# Patient Record
Sex: Female | Born: 1970 | Race: Black or African American | Hispanic: No | Smoking: Current every day smoker
Health system: Southern US, Community
[De-identification: ages and names within clinical notes are randomized; demographics above are authoritative.]

## PROBLEM LIST (undated history)

## (undated) DIAGNOSIS — F1010 Alcohol abuse, uncomplicated: Secondary | ICD-10-CM

## (undated) DIAGNOSIS — S92909A Unspecified fracture of unspecified foot, initial encounter for closed fracture: Secondary | ICD-10-CM

## (undated) DIAGNOSIS — F32A Depression, unspecified: Secondary | ICD-10-CM

## (undated) DIAGNOSIS — F419 Anxiety disorder, unspecified: Secondary | ICD-10-CM

## (undated) DIAGNOSIS — E785 Hyperlipidemia, unspecified: Secondary | ICD-10-CM

## (undated) DIAGNOSIS — I119 Hypertensive heart disease without heart failure: Secondary | ICD-10-CM

## (undated) DIAGNOSIS — F329 Major depressive disorder, single episode, unspecified: Secondary | ICD-10-CM

## (undated) DIAGNOSIS — I43 Cardiomyopathy in diseases classified elsewhere: Principal | ICD-10-CM

## (undated) HISTORY — DX: Unspecified fracture of unspecified foot, initial encounter for closed fracture: S92.909A

## (undated) HISTORY — PX: GASTRIC BYPASS: SHX52

## (undated) HISTORY — DX: Hyperlipidemia, unspecified: E78.5

## (undated) HISTORY — DX: Major depressive disorder, single episode, unspecified: F32.9

## (undated) HISTORY — DX: Depression, unspecified: F32.A

## (undated) HISTORY — DX: Hypertensive heart disease without heart failure: I11.9

## (undated) HISTORY — DX: Cardiomyopathy in diseases classified elsewhere: I43

---

## 1998-05-23 ENCOUNTER — Emergency Department (HOSPITAL_COMMUNITY): Admission: EM | Admit: 1998-05-23 | Discharge: 1998-05-23 | Payer: Self-pay | Admitting: Family Medicine

## 1998-06-01 ENCOUNTER — Emergency Department (HOSPITAL_COMMUNITY): Admission: EM | Admit: 1998-06-01 | Discharge: 1998-06-01 | Payer: Self-pay | Admitting: Emergency Medicine

## 1998-06-17 ENCOUNTER — Emergency Department (HOSPITAL_COMMUNITY): Admission: EM | Admit: 1998-06-17 | Discharge: 1998-06-17 | Payer: Self-pay | Admitting: Emergency Medicine

## 1998-06-17 ENCOUNTER — Encounter: Payer: Self-pay | Admitting: Emergency Medicine

## 1999-06-29 ENCOUNTER — Emergency Department (HOSPITAL_COMMUNITY): Admission: EM | Admit: 1999-06-29 | Discharge: 1999-06-29 | Payer: Self-pay | Admitting: Emergency Medicine

## 2000-05-13 ENCOUNTER — Encounter (INDEPENDENT_AMBULATORY_CARE_PROVIDER_SITE_OTHER): Payer: Self-pay

## 2000-05-13 ENCOUNTER — Other Ambulatory Visit: Admission: RE | Admit: 2000-05-13 | Discharge: 2000-05-13 | Payer: Self-pay | Admitting: Gynecology

## 2000-09-05 ENCOUNTER — Emergency Department (HOSPITAL_COMMUNITY): Admission: EM | Admit: 2000-09-05 | Discharge: 2000-09-06 | Payer: Self-pay | Admitting: Emergency Medicine

## 2000-09-06 ENCOUNTER — Encounter: Payer: Self-pay | Admitting: Emergency Medicine

## 2001-03-16 ENCOUNTER — Other Ambulatory Visit: Admission: RE | Admit: 2001-03-16 | Discharge: 2001-03-16 | Payer: Self-pay | Admitting: Obstetrics and Gynecology

## 2001-05-20 ENCOUNTER — Encounter: Payer: Self-pay | Admitting: Emergency Medicine

## 2001-05-20 ENCOUNTER — Emergency Department (HOSPITAL_COMMUNITY): Admission: EM | Admit: 2001-05-20 | Discharge: 2001-05-20 | Payer: Self-pay | Admitting: Emergency Medicine

## 2002-11-03 ENCOUNTER — Other Ambulatory Visit: Admission: RE | Admit: 2002-11-03 | Discharge: 2002-11-03 | Payer: Self-pay | Admitting: Obstetrics and Gynecology

## 2005-07-05 ENCOUNTER — Emergency Department (HOSPITAL_COMMUNITY): Admission: EM | Admit: 2005-07-05 | Discharge: 2005-07-06 | Payer: Self-pay | Admitting: Emergency Medicine

## 2006-09-16 ENCOUNTER — Emergency Department (HOSPITAL_COMMUNITY): Admission: EM | Admit: 2006-09-16 | Discharge: 2006-09-16 | Payer: Self-pay | Admitting: Emergency Medicine

## 2007-11-08 ENCOUNTER — Other Ambulatory Visit: Admission: RE | Admit: 2007-11-08 | Discharge: 2007-11-08 | Payer: Self-pay | Admitting: Gynecology

## 2007-11-25 ENCOUNTER — Ambulatory Visit (HOSPITAL_COMMUNITY): Admission: RE | Admit: 2007-11-25 | Discharge: 2007-11-25 | Payer: Self-pay | Admitting: Gynecology

## 2008-06-03 ENCOUNTER — Inpatient Hospital Stay (HOSPITAL_COMMUNITY): Admission: AD | Admit: 2008-06-03 | Discharge: 2008-06-04 | Payer: Self-pay | Admitting: Obstetrics

## 2008-08-08 ENCOUNTER — Inpatient Hospital Stay (HOSPITAL_COMMUNITY): Admission: AD | Admit: 2008-08-08 | Discharge: 2008-08-12 | Payer: Self-pay | Admitting: Obstetrics & Gynecology

## 2008-08-09 ENCOUNTER — Encounter: Payer: Self-pay | Admitting: Obstetrics & Gynecology

## 2008-09-26 ENCOUNTER — Inpatient Hospital Stay (HOSPITAL_COMMUNITY): Admission: EM | Admit: 2008-09-26 | Discharge: 2008-09-27 | Payer: Self-pay | Admitting: Emergency Medicine

## 2008-11-01 ENCOUNTER — Emergency Department (HOSPITAL_COMMUNITY): Admission: EM | Admit: 2008-11-01 | Discharge: 2008-11-01 | Payer: Self-pay | Admitting: Family Medicine

## 2010-01-28 ENCOUNTER — Ambulatory Visit: Payer: Self-pay | Admitting: Cardiovascular Disease

## 2010-02-06 ENCOUNTER — Ambulatory Visit: Payer: Self-pay | Admitting: Cardiovascular Disease

## 2010-02-06 ENCOUNTER — Ambulatory Visit (HOSPITAL_COMMUNITY): Admission: RE | Admit: 2010-02-06 | Discharge: 2010-02-06 | Payer: Self-pay | Admitting: Cardiovascular Disease

## 2010-02-06 ENCOUNTER — Encounter: Payer: Self-pay | Admitting: Cardiovascular Disease

## 2010-05-29 ENCOUNTER — Ambulatory Visit: Payer: Self-pay | Admitting: Cardiovascular Disease

## 2010-07-14 ENCOUNTER — Encounter: Payer: Self-pay | Admitting: Obstetrics

## 2010-10-02 LAB — HOMOCYSTEINE: Homocysteine: 3.9 umol/L — ABNORMAL LOW (ref 4.0–15.4)

## 2010-10-02 LAB — POCT CARDIAC MARKERS
CKMB, poc: <1 ng/mL — ABNORMAL LOW (ref 1.0–8.0)
CKMB, poc: <1 ng/mL — ABNORMAL LOW (ref 1.0–8.0)
Myoglobin, poc: 43.2 ng/mL (ref 12–200)
Troponin i, poc: <0.05 ng/mL (ref 0.00–0.09)
Troponin i, poc: <0.05 ng/mL (ref 0.00–0.09)

## 2010-10-02 LAB — CARDIAC PANEL(CRET KIN+CKTOT+MB+TROPI)
CK, MB: 1 ng/mL (ref 0.3–4.0)
Relative Index: INVALID (ref 0.0–2.5)
Relative Index: INVALID (ref 0.0–2.5)
Total CK: 78 U/L (ref 7–177)
Troponin I: <0.01 ng/mL (ref 0.00–0.06)

## 2010-10-02 LAB — GLUCOSE, CAPILLARY: Glucose-Capillary: 94 mg/dL (ref 70–99)

## 2010-10-02 LAB — WET PREP, GENITAL
Clue Cells Wet Prep HPF POC: NONE SEEN
WBC, Wet Prep HPF POC: NONE SEEN

## 2010-10-02 LAB — LIPID PANEL
HDL: 38 mg/dL — ABNORMAL LOW (ref >39)
Total CHOL/HDL Ratio: 5.5 RATIO

## 2010-10-02 LAB — URINALYSIS, ROUTINE W REFLEX MICROSCOPIC
Glucose, UA: NEGATIVE mg/dL
Hgb urine dipstick: NEGATIVE
Protein, ur: NEGATIVE mg/dL
Specific Gravity, Urine: 1.042 — ABNORMAL HIGH (ref 1.005–1.030)

## 2010-10-02 LAB — RAPID URINE DRUG SCREEN, HOSP PERFORMED
Amphetamines: NOT DETECTED
Barbiturates: NOT DETECTED
Cocaine: NOT DETECTED
Opiates: POSITIVE — AB

## 2010-10-02 LAB — COMPREHENSIVE METABOLIC PANEL
Albumin: 3.8 g/dL (ref 3.5–5.2)
Alkaline Phosphatase: 92 U/L (ref 39–117)
BUN: 9 mg/dL (ref 6–23)
Calcium: 9.5 mg/dL (ref 8.4–10.5)
Glucose, Bld: 103 mg/dL — ABNORMAL HIGH (ref 70–99)
Potassium: 4 mEq/L (ref 3.5–5.1)
Total Protein: 7.2 g/dL (ref 6.0–8.3)

## 2010-10-02 LAB — CBC
HCT: 37.3 % (ref 36.0–46.0)
Hemoglobin: 12.7 g/dL (ref 12.0–15.0)
MCHC: 34.1 g/dL (ref 30.0–36.0)
Platelets: 376 10*3/uL (ref 150–400)
RDW: 14.5 % (ref 11.5–15.5)

## 2010-10-02 LAB — DIFFERENTIAL
Lymphocytes Relative: 30 % (ref 12–46)
Lymphs Abs: 2.5 10*3/uL (ref 0.7–4.0)
Monocytes Absolute: 0.7 10*3/uL (ref 0.1–1.0)
Monocytes Relative: 8 % (ref 3–12)
Neutro Abs: 4.8 10*3/uL (ref 1.7–7.7)
Neutrophils Relative %: 56 % (ref 43–77)

## 2010-10-02 LAB — PROTIME-INR: INR: 0.9 (ref 0.00–1.49)

## 2010-10-02 LAB — URINE MICROSCOPIC-ADD ON

## 2010-10-02 LAB — GC/CHLAMYDIA PROBE AMP, GENITAL: GC Probe Amp, Genital: NEGATIVE

## 2010-10-02 LAB — APTT: aPTT: 27 seconds (ref 24–37)

## 2010-10-02 LAB — URINE CULTURE: Colony Count: 60000

## 2010-10-08 LAB — CBC
HCT: 32.7 % — ABNORMAL LOW (ref 36.0–46.0)
HCT: 36.3 % (ref 36.0–46.0)
Hemoglobin: 11.2 g/dL — ABNORMAL LOW (ref 12.0–15.0)
Hemoglobin: 12.5 g/dL (ref 12.0–15.0)
MCHC: 34.1 g/dL (ref 30.0–36.0)
MCHC: 34.3 g/dL (ref 30.0–36.0)
MCV: 91.3 fL (ref 78.0–100.0)
MCV: 91.8 fL (ref 78.0–100.0)
Platelets: 322 10*3/uL (ref 150–400)
RBC: 3.96 MIL/uL (ref 3.87–5.11)
RDW: 14.6 % (ref 11.5–15.5)
WBC: 14 10*3/uL — ABNORMAL HIGH (ref 4.0–10.5)

## 2010-10-08 LAB — RPR: RPR Ser Ql: NONREACTIVE

## 2010-11-05 NOTE — H&P (Signed)
NAME:  Shannon Cervantes, Shannon Cervantes                 ACCOUNT NO.:  [id number]   MEDICAL RECORD NO.:  [id number]          PATIENT TYPE:  EMS   LOCATION:  ED                           FACILITY:  [facility]   PHYSICIAN:  Lonia Blood, M.D.      DATE OF BIRTH:  06/19/1971   DATE OF ADMISSION:  09/26/2008  DATE OF DISCHARGE:                              HISTORY & PHYSICAL   PRIMARY CARE PHYSICIAN:  The patient is unassigned.   PRESENTING COMPLAINT:  Near syncope.   HISTORY OF PRESENT ILLNESS:  The patient is a 40 year old female who is  a staff of [employer], status post CS delivery about 6 weeks  ago.  The patient has been staying with her son.  She stopped breast  feeding about 2 weeks ago, but she has been under a lot of stress, not  sleeping adequately because of the child.  She has been feeling tired  and weak in the past couple of days, but today she felt dizzy whenever  she got up to the extent that felt she was going to pass out.  She had  some headache associated with it, but denied any fever.  She denied any  nausea, vomiting or diarrhea.  She has had some hypertension associated  with her pregnancy, but did not take any medicine for that.  She was  seen by EMS and brought to the emergency room for further management.  She did feel like she was having flu-like symptoms.  On arrival at the  ED, the patient's blood pressure was 150s/90s, otherwise no specific  findings, except for orthostasis.   PAST MEDICAL HISTORY:  None.   ALLERGIES:  TORADOL.   MEDICATIONS:  Prenatal vitamins.   SOCIAL HISTORY:  The patient just had a baby 6 weeks ago.  Denied any  tobacco or active alcohol use.  No IV drug use.  She works as one of Administrator, sports for [employer].   FAMILY HISTORY:  The patient's father died from heart disease at an old  age.  Her mother has diabetes and hypertension, but she is still living.   REVIEW OF SYSTEMS:  Mainly weakness and tiredness, occasional headaches.  Otherwise 14 point review of systems is per HPI.   PHYSICAL EXAMINATION:  VITAL SIGNS:  Her temperature is 98.9, blood  pressure 120/82, pulse 86, respiratory rate 20.  Saturations 97% on room  air.  Her orthostatics showed a pulse rising to 153 from 74 when the  patient went from lying to standing.  GENERAL:  She is awake, alert and oriented.  A very pleasant lady in no  acute distress.  HEENT:  PERRL.  EOMI.  NECK:  Supple.  No JVD, no lymphadenopathy.  RESPIRATORY:  She has good air entry bilaterally.  No wheezes, no rales.  CARDIOVASCULAR:  She has S1-S2, no murmur.  ABDOMEN:  Soft, nontender with positive bowel sounds with healing scar  from her surgery.  EXTREMITIES:  Show no edema, cyanosis or clubbing.   LABORATORY DATA:  Initial cardiac enzymes are negative.  White count  8.5,  hemoglobin 12.7 with platelet count 376.  Normal differentials.  PT  12.4, INR 0.9, PTT 27.  Sodium 139, potassium 4.0, chloride 108, CO2 of  26, glucose 103, BUN 9, creatinine 0.77, calcium 9.5.  The rest of the  LFTs are normal.  Urinalysis showed cloudy urine concentrated with a  specific gravity of 1.042 and pH of 5.5.  Small bilirubin and trace  leukocyte esterase.  WBCs 0-2 and RBCs 0-2, and many bacteria.  Chest x-  ray showed no active cardiopulmonary disease.  Head CT without contrast  was negative for anything acute.  EKG showed normal sinus rhythm with a  rate of 76.  There was possible left atrial enlargement, but no ST-T  wave changes.   ASSESSMENT:  Therefore, this is a 40 year old female, status postpartum  about 6 weeks ago, who has been under a lot of stress, now presenting  with what appears to be orthostasis and presyncope.  The patient seems  to also be dehydrated.  The most likely scenario is that the patient is  stressed due to lack of sleep and possibly flu-like symptoms made her  slightly dehydrated and became orthostatic, which is why she had the  presyncopal episode.  No  prior cardiac history.  By her age, she is  without any previous medical history, less likely to be something more  cardiac.   PLAN:  1. Presyncope.  We will admit the patient, hydrate her and put her on      observation.  We will check 2-D echo just to be on the safe side,      but will keep her on tele overnight to see if there is any      significant findings on tele.  Otherwise, if she is well hydrated      and her orthostatics are normal, she probably can leave the      hospital within 24-48 hours.  2. Pregnancy related hypertension.  This seems to have resolved for      now.  3. Dehydration.  We will continue with hydration.  4. Status post childbear 6 weeks ago.  The patient has had her 6 week      visit with her obstetrician.  Per patient, everything was fine.  At      this point, she is not breast-feeding, so we do not have to be      careful about any specific medications.  Otherwise, further      treatment will all depend on the patient's response and how she      responds to IV fluids.      Lonia Blood, M.D.  Electronically Signed     LG/MEDQ  D:  09/26/2008  T:  09/26/2008  Job:  [id number]

## 2010-11-05 NOTE — Discharge Summary (Signed)
Shannon Cervantes, Shannon Cervantes                 ACCOUNT NO.:  [id number]   MEDICAL RECORD NO.:  [id number]          PATIENT TYPE:  INP   LOCATION:  1406                         FACILITY:  [facility]   PHYSICIAN:  Elliot Cousin, M.D.    DATE OF BIRTH:  [birth year]   DATE OF ADMISSION:  09/26/2008  DATE OF DISCHARGE:  09/27/2008                               DISCHARGE SUMMARY   PRIMARY GYNECOLOGIST:  Dr. Coral Ceo.   DISCHARGE DIAGNOSES:  1. Presyncope, secondary to a lack of sleep and volume depletion.  The      patient was also mildly orthostatic.  2. Ambiguous TSH per lab results.  3. Hyperlipidemia.  4. Bipolar disorder.  5. Postpartum   DISCHARGE MEDICATIONS:  1. Prenatal vitamins once daily.  2. Zoloft 50 mg in the morning and 25 mg at bedtime.  3. Ambien 10 mg at bedtime.   DISCHARGE DISPOSITION:  The patient has been discharged to home in  improved and stable condition.  She was advised to follow up with her  gynecologist in 2-4 weeks or as previously scheduled.   PROCEDURES PERFORMED:  1. CT scan of the head without contrast on September 26, 2008.  The results      revealed negative for bleed or other acute intracranial process.  2. Chest X-Ray: On September 26, 2008.  The results revealed no acute      cardiopulmonary disease.   HISTORY OF PRESENT ILLNESS:  The patient is a 40 year old woman with a  past medical history significant for a recent C-section delivery  approximately 6 weeks ago and bipolar disorder, who presented to the  emergency department on September 26, 2008 with a chief complaint of  generalized weakness, feeling tired, and dizziness.  At some point, she  felt as if she was going to pass out.  She did have associated headache  but no fever, nausea, vomiting, or diarrhea.  She felt as if she was  having flu-like symptoms.  However, she had no complaints of a sore  throat, rhinorrhea, or ear ache.   When she was evaluated in the emergency department, she was noted to be  hemodynamically stable and afebrile.  Apparently, orthostatic vital  signs were ordered and she was found to be somewhat orthostatic.  The CT  scan of the head without contrast revealed no acute abnormalities.  Her  EKG revealed normal sinus rhythm with a heart rate of 76 beats per  minute and no ST or T-wave abnormalities.  The patient was admitted for  observation and further evaluation.   For additional details please see the dictated history and physical.   HOSPITAL COURSE:  1. PRESYNCOPE:  The patient was started on IV fluid volume repletion,      as it was felt that she was somewhat volume depleted.  For      additional evaluation, a 2-D echocardiogram was ordered as well as      cardiac enzymes, TSH, and a fasting lipid panel.  Of note, the      patient did undergo evaluation in the emergency department with a  wet prep that was performed by the emergency department physician.      The wet prep was essentially negative and the GC and chlamydia      probe was negative as well.  Her urinalysis revealed a trace of      leukocytes and no nitrites.  Her urine drug screen was positive for      opiates, which she had been prescribed prior to this      hospitalization.  Her cardiac enzymes proved to be within normal      limits.  Her fasting lipid panel revealed a total cholesterol of      210, triglycerides of 90, LDL cholesterol of 154, and HDL      cholesterol of 38.  Her TSH was initially 1.010.  However, for some      reason, it was ordered again.  The second TSH was surprisingly      borderline low at 0.320.  Obviously this is an ambiguous finding.      No further lab tests were ordered to clarify this ambiguity;      however, the patient was instructed to follow up with her      gynecologist in 4-6 weeks to have the TSH ordered again.  The      patient voiced understanding.  Following approximately 24 hours of      IV fluid volume repletion, the patient's symptoms have  completely      resolved.  She feels more rested and more hydrated.  She has no      complaints of dizziness at this time.  The 2-D echocardiogram was      cancelled as the patient is symptomatically improved.  The patient      was encouraged to avoid dehydration and to obtain ample rest.  She      was advised to ask family and friends for assistance with taking      care of her new infant.  2. AMBIGUOUS TSH:  As above, the patient was instructed to have her      TSH rechecked in 4-6 weeks.  3. HYPERLIPIDEMIA:  The results of the patient's cholesterol panel are      above.  Rather than starting the patient on a statin, she was      instructed to follow a low-fat diet.  She has no other risk factors      for cardiac disease.  She was also instructed to ask her OB/GYN to      recheck her cholesterol panel in 6 months.  4. BIPOLAR DISORDER:  The patient remained stable.  She was advised to      continue Zoloft as previously prescribed.      Elliot Cousin, M.D.  Electronically Signed     DF/MEDQ  D:  09/27/2008  T:  09/27/2008  Job:  [id number]   cc:   Leonette Most A. Clearance Coots, M.D.  Fax: [phone]

## 2010-11-05 NOTE — Discharge Summary (Signed)
NAMEDAFINA, SUK                 ACCOUNT NO.:  [id number]   MEDICAL RECORD NO.:  [id number]          PATIENT TYPE:  INP   LOCATION:  9101                          FACILITY:  [facility]   PHYSICIAN:  Charles A. Clearance Coots, M.D.DATE OF BIRTH:  1970-08-02   DATE OF ADMISSION:  08/08/2008  DATE OF DISCHARGE:  08/12/2008                               DISCHARGE SUMMARY   ADMITTING DIAGNOSES:  Term pregnancy, previous cesarean section x3,  oligohydramnios, suspicious fetal heart tones with variable  decelerations.   DISCHARGE DIAGNOSES:  Term pregnancy, previous cesarean section x3,  oligohydramnios, suspicious fetal heart tones with variable  decelerations, status post repeat low transverse cesarean section on  August 09, 2008.  Viable female infant was delivered at 25.  Apgars 8  at 1 minute 9 at 5 minutes.  Weight of 23, 30 g.  Length of 46.36 cm.  Mother and infant discharged home in good condition.   REASON FOR ADMISSION:  A 40 year old black female para 3, estimated date  of confinement August 30, 2008 presented with decreased fetal movement.  Ultrasound was obtained and revealed a biophysical profile of 8/8, but  amniotic fluid index was 3.46.  Initially, the nonstress testing was  minimally reactive.  The patient denied spontaneous rupture of  membranes.   PAST MEDICAL HISTORY:  Surgery cesarean section x3.   ILLNESSES:  Depression.   MEDICATIONS:  Prenatal vitamins.   ALLERGIES:  TORADOL.   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  Tobacco, half pack a day.  Negative alcohol or recreational drug use.   FAMILY HISTORY:  Positive for hypertension and diabetes.   OBSTETRICAL RISK FACTORS:  Herpes simplex virus, depression, advanced  maternal age, history of cesarean section x3.   PHYSICAL EXAMINATION:  GENERAL:  Well-nourished, well-developed female  in no acute distress.  Afebrile.  VITAL SIGNS:  Stable.  LUNGS:  Clear to auscultation bilaterally.  HEART:  Regular rate and  rhythm.  ABDOMEN: Gravid, nontender.  Cervix long, closed.  Vertex at -3 station.  External fetal monitoring revealed fetal heart rate 140-150 beats per  minute.  Occasional moderate variable decelerations.  Irregular uterine  contractions.   ADMITTING LABS:  Hemoglobin 12, hematocrit 36, white blood cell count  14,000, platelets 349,000.  RPR was nonreactive.   HOSPITAL COURSE:  The patient was admitted and decision was made to  proceed with repeat cesarean section delivery because of suspicious  fetal heart tone with variable fetal heart rate decelerations.  History  of cesarean section x3 with oligohydramnios.  Repeat low transverse  cesarean section was performed on August 09, 2008.  There were no  intraoperative complications.  Postoperative course was uncomplicated.  The patient was discharged home on postop day #3 in good condition.   DISCHARGE LABS:  Hemoglobin 11, hematocrit 32, white blood cell count  11,000, platelets 322,000.   DISCHARGE DISPOSITION:   MEDICATIONS:  Percocet and ibuprofen was prescribed for pain.  Continue  prenatal vitamins.  Routine written instructions were given for  discharge after cesarean section.  The patient is to call office for  followup appointment in  2 weeks.      Charles A. Clearance Coots, M.D.  Electronically Signed     CAH/MEDQ  D:  08/12/2008  T:  08/12/2008  Job:  [id number]

## 2010-11-05 NOTE — Op Note (Signed)
NAMEMANVI, Shannon Cervantes                 ACCOUNT NO.:  [id number]   MEDICAL RECORD NO.:  [id number]          PATIENT TYPE:  INP   LOCATION:  9155                          FACILITY:  [facility]   PHYSICIAN:  Roseanna Rainbow, M.D.DATE OF BIRTH:  June 26, 1970   DATE OF PROCEDURE:  08/09/2008  DATE OF DISCHARGE:                               OPERATIVE REPORT   PREOPERATIVE DIAGNOSES:  1. Intrauterine pregnancy at term.  2. Oligohydramnios, suspicious fetal heart tracing with variable      decelerations.  3. History of 3 previous cesarean deliveries.   POSTOPERATIVE DIAGNOSES:  1. Intrauterine pregnancy at term.  2. Oligohydramnios, suspicious fetal heart tracing with variable      decelerations.  3. History of 3 previous cesarean deliveries.  4. Rule out small for gestational age infant.   PROCEDURE:  Repeat low uterine flap elliptical cesarean delivery.   SURGEON:  Roseanna Rainbow, MD and Kathreen Cosier, MD   ANESTHESIA:  Spinal.   PATHOLOGY:  Placenta.   ESTIMATED BLOOD LOSS:  600 mL.   COMPLICATIONS:  None.   PROCEDURE:  The patient was taken to the operating room with an IV  running.  A spinal anesthetic was then placed.  She was placed in the  dorsal supine position with a leftward tilt and prepped and draped in  the usual sterile fashion.  After a time-out had been completed, the  previous transverse skin incision was incised with a scalpel and carried  down to the underlying fascia.  The fascia was incised along the length  of the incision using the Bovie.  The superior aspect of the fascial  incision was tented up and the underlying rectus muscles dissected off.  The inferior aspect of the fascial incision was manipulated in similar  fashion.  The anterior Kochers were placed on the anterior edges of the  fascial incision and this was elevated with a lap pad secured to the  anesthesia.  The rectus muscles were separated in the midline.  The  parietal peritoneum  was tented up and entered sharply.  This incision  was then extended superiorly and inferiorly with good visualization of  the bladder.  The bladder blade was then placed.  The vesicouterine  peritoneum was tented up and entered sharply.  This incision was then  extended bilaterally and bladder flap created using both sharp and blunt  dissection.  The bladder blade was then replaced.  The lower uterine  segment was then incised in a transverse fashion with the scalpel.  This  incision was then extended bluntly.  The infant's head was delivered  atraumatically.  The oropharynx was suctioned with a bulb suction.  The  double nuchal cord was easily reduced.  The cord was clamped and cut.  The infant was handed off to the awaiting neonatologist.  The placenta  was then removed.  The intrauterine cavity was evacuated of any  remaining amniotic fluid, clots, and debris with moist laparotomy  sponge.  The uterine incision was then reapproximated in a running  interlocking fashion using a suture of 0 Monocryl.  A second imbricating  layer of the same suture was then placed.  The paracolic gutters were  then irrigated.  The parietal peritoneum was then reapproximated in  running fashion using a suture of 2-0 Vicryl.  The Kochers suspending  the fascial incision were then removed.  The fascial incision was then  reapproximated in a running fashion using 2 sutures of 0 PDS tied in the  midline.  The skin was closed in a subcuticular fashion using a suture  of 3-0 Monocryl.  At the close of the procedure, the instrument and pack  counts were said to be correct x2.  The patient was taken to the PACU  awake and in stable condition.      Roseanna Rainbow, M.D.  Electronically Signed     LAJ/MEDQ  D:  08/09/2008  T:  08/09/2008  Job:  [id number]

## 2011-01-07 DIAGNOSIS — S92909A Unspecified fracture of unspecified foot, initial encounter for closed fracture: Secondary | ICD-10-CM

## 2011-01-07 HISTORY — DX: Unspecified fracture of unspecified foot, initial encounter for closed fracture: S92.909A

## 2011-01-08 ENCOUNTER — Emergency Department (HOSPITAL_COMMUNITY): Payer: 59

## 2011-01-08 ENCOUNTER — Emergency Department (HOSPITAL_COMMUNITY)
Admission: EM | Admit: 2011-01-08 | Discharge: 2011-01-08 | Disposition: A | Discharge status: Home / Self Care | Payer: 59 | Attending: Emergency Medicine | Admitting: Emergency Medicine

## 2011-01-08 DIAGNOSIS — Y92009 Unspecified place in unspecified non-institutional (private) residence as the place of occurrence of the external cause: Secondary | ICD-10-CM | POA: Insufficient documentation

## 2011-01-08 DIAGNOSIS — F329 Major depressive disorder, single episode, unspecified: Secondary | ICD-10-CM | POA: Insufficient documentation

## 2011-01-08 DIAGNOSIS — S92919A Unspecified fracture of unspecified toe(s), initial encounter for closed fracture: Secondary | ICD-10-CM | POA: Insufficient documentation

## 2011-01-08 DIAGNOSIS — Z79899 Other long term (current) drug therapy: Secondary | ICD-10-CM | POA: Insufficient documentation

## 2011-01-08 DIAGNOSIS — F3289 Other specified depressive episodes: Secondary | ICD-10-CM | POA: Insufficient documentation

## 2011-01-08 DIAGNOSIS — W2209XA Striking against other stationary object, initial encounter: Secondary | ICD-10-CM | POA: Insufficient documentation

## 2011-08-07 ENCOUNTER — Telehealth: Payer: Self-pay | Admitting: Cardiovascular Disease

## 2011-08-07 NOTE — Telephone Encounter (Signed)
Pt Faxed ROi over to me, LOV,12,Echo were faxed to Dr.Dasher @ [phone] 08/07/11/KM

## 2011-08-18 ENCOUNTER — Telehealth: Payer: Self-pay | Admitting: Cardiovascular Disease

## 2011-08-18 NOTE — Telephone Encounter (Signed)
Chart reviewed by Dr Nahser/ echo mild LVH, HTN cardiomyopathy due to weight. App made for clearance for bariatric surgery. Pt informed dx cardiomyopathy.

## 2011-08-18 NOTE — Telephone Encounter (Signed)
Pt calling re trying to get surgical clearence  to get bariatric surgery, pt needs documentation of the cardiomyopathy diagnosis and copy of  echo results and stress test to clear her, pls  call

## 2011-08-19 ENCOUNTER — Telehealth: Payer: Self-pay | Admitting: Cardiovascular Disease

## 2011-08-19 NOTE — Telephone Encounter (Signed)
New problem:  Per Lavonna Rua, need a letter stating patient has cardiomyopathy when she was diagnosis. - need approval & clearance   for weight loss surgery

## 2011-08-20 ENCOUNTER — Encounter: Payer: Self-pay | Admitting: *Deleted

## 2011-08-20 ENCOUNTER — Telehealth: Payer: Self-pay | Admitting: Cardiovascular Disease

## 2011-08-20 ENCOUNTER — Encounter: Payer: Self-pay | Admitting: Cardiovascular Disease

## 2011-08-20 ENCOUNTER — Ambulatory Visit (INDEPENDENT_AMBULATORY_CARE_PROVIDER_SITE_OTHER): Payer: 59 | Admitting: Cardiovascular Disease

## 2011-08-20 VITALS — BP 120/82 | HR 98 | Ht 63.0 in | Wt 204.4 lb

## 2011-08-20 DIAGNOSIS — I428 Other cardiomyopathies: Secondary | ICD-10-CM

## 2011-08-20 DIAGNOSIS — I119 Hypertensive heart disease without heart failure: Secondary | ICD-10-CM

## 2011-08-20 DIAGNOSIS — Z01818 Encounter for other preprocedural examination: Secondary | ICD-10-CM

## 2011-08-20 DIAGNOSIS — I43 Cardiomyopathy in diseases classified elsewhere: Secondary | ICD-10-CM | POA: Insufficient documentation

## 2011-08-20 DIAGNOSIS — E669 Obesity, unspecified: Secondary | ICD-10-CM

## 2011-08-20 LAB — CBC WITH DIFFERENTIAL/PLATELET
Basophils Absolute: 0.1 10*3/uL (ref 0.0–0.1)
Basophils Relative: 0.9 % (ref 0.0–3.0)
Eosinophils Absolute: 0.5 10*3/uL (ref 0.0–0.7)
MCHC: 34 g/dL (ref 30.0–36.0)
MCV: 93.1 fl (ref 78.0–100.0)
Monocytes Absolute: 0.6 10*3/uL (ref 0.1–1.0)
Neutro Abs: 6.2 10*3/uL (ref 1.4–7.7)
Neutrophils Relative %: 58 % (ref 43.0–77.0)
RBC: 4.13 Mil/uL (ref 3.87–5.11)
RDW: 14.4 % (ref 11.5–14.6)

## 2011-08-20 LAB — BASIC METABOLIC PANEL
BUN: 8 mg/dL (ref 6–23)
Calcium: 9.5 mg/dL (ref 8.4–10.5)
Chloride: 111 mEq/L (ref 96–112)
Creatinine, Ser: 0.7 mg/dL (ref 0.4–1.2)
GFR: 129.73 mL/min (ref >60.00)

## 2011-08-20 LAB — HEPATIC FUNCTION PANEL
AST: 15 U/L (ref 0–37)
Albumin: 4.2 g/dL (ref 3.5–5.2)
Alkaline Phosphatase: 66 U/L (ref 39–117)
Bilirubin, Direct: 0 mg/dL (ref 0.0–0.3)
Total Protein: 8.1 g/dL (ref 6.0–8.3)

## 2011-08-20 LAB — LIPID PANEL
Cholesterol: 161 mg/dL (ref 0–200)
LDL Cholesterol: 100 mg/dL — ABNORMAL HIGH (ref 0–99)
Triglycerides: 131 mg/dL (ref 0.0–149.0)

## 2011-08-20 NOTE — Assessment & Plan Note (Signed)
Shannon Cervantes is doing well she has lleft ventricular hypertrophy by echo in August 2011.  This LVH  is most likely due to her excess weight.  She has tried multiple weight loss programs but has not been successful on a long term basis.  She now wants to explore gastric bypass surgery.    At this point she seems to be doing well. She is not had any extra risk from a cardiovascular standpoint. She should be at low risk for her upcoming gastric bypass surgery if she chooses to proceed.  I will see her in 1 year.

## 2011-08-20 NOTE — Patient Instructions (Signed)
Your physician wants you to follow-up in:1 YEAR  You will receive a reminder letter in the mail two months in advance. If you don't receive a letter, please call our office to schedule the follow-up appointment.   Your physician recommends that you return for a FASTING lipid profile: TODAY AND IN 1 YEAR   

## 2011-08-20 NOTE — Telephone Encounter (Signed)
Follow up from previous call:  Per Lavonna Rua, calling back to speak with nurse.

## 2011-08-20 NOTE — Progress Notes (Signed)
    Elwanda Brooklyn Date of Birth  02-29-1971 [facility]     [place] [facility]  [address]    [address]   [address] [place], [place]  [address]    [place], [place]  [address] [phone]  Fax  [phone]  [phone]  Fax [phone]  Problem List: 1. Hypertensive Cardiolyopathy 2. Hyperlipidemia 3. Obesity  History of Present Illness:  Sedalia Muta is a 41 year old female who I saw several years ago. She has a history of obesity. She has had an echo in the past which revealed mild left ventricular hypertrophy which I suspect is due to the obesity. She's not had any additional problems and in fact has not been back to see me in quite some time. She's not had any cardiac lites.  She's had lots of difficulty losing weight. She's tried Weight Watchers and other medical weight loss clinics. She would like to consider bariatric surgery. She was asked to get cardiac clearance before she could be considered for bariatric surgery.  She denies any chest pain or shortness of breath.  Current Outpatient Prescriptions  Medication Sig Dispense Refill  . buPROPion (WELLBUTRIN XL) 150 MG 24 hr tablet Take 150 mg by mouth daily.      . clonazePAM (KLONOPIN) 0.5 MG tablet Take 0.5 mg by mouth daily.      Marland Kitchen HYDROcodone-acetaminophen (VICODIN) 5-500 MG per tablet Take 1 tablet by mouth every 6 (six) hours as needed.      . zolpidem (AMBIEN) 10 MG tablet Take 10 mg by mouth daily.         No Known Allergies  Past Medical History  Diagnosis Date  . Foot fracture 01-07-2011  . Hypertensive cardiomyopathy   . Depression   . Hyperlipidemia     Past Surgical History  Procedure Date  . Cesarean section     History  Smoking status  . Current Some Day Smoker  Smokeless tobacco  . Not on file  Comment: 1 Cigarettes every other Day    History  Alcohol Use     Family History  Problem Relation Age of Onset  . Hypertension Mother   . Heart disease Mother   . Hyperlipidemia  Mother   . Heart disease Father     Reviw of Systems:  Reviewed in the HPI.  All other systems are negative.  Physical Exam: Blood pressure 120/82, pulse 98, height 5\' 3"  (1.6 m), weight 204 lb 6.4 oz (92.715 kg). General: Well developed, well nourished, in no acute distress. She is mildly to moderately obese.  Head: Normocephalic, atraumatic, sclera non-icteric, mucus membranes are moist,   Neck: Supple. Carotids are 2 + without bruits. No JVD  Lungs: Clear bilaterally to auscultation.  Heart: regular rate  With normal  S1 S2. No murmurs, gallops or rubs.  Abdomen: Soft, non-tender, non-distended with normal bowel sounds. No hepatomegaly. No rebound/guarding. No masses. She is moderately obese.  Msk:  Strength and tone are normal  Extremities: No clubbing or cyanosis. No edema.  Distal pedal pulses are 2+ and equal bilaterally.  Neuro: Alert and oriented X 3. Moves all extremities spontaneously.  Psych:  Responds to questions appropriately with a normal affect.  ECG: NSR, nonspecific ST abnormalities.   Assessment / Plan:

## 2011-08-21 ENCOUNTER — Emergency Department (HOSPITAL_COMMUNITY)
Admission: EM | Admit: 2011-08-21 | Discharge: 2011-08-21 | Disposition: A | Discharge status: Home / Self Care | Payer: 59 | Attending: Emergency Medicine | Admitting: Emergency Medicine

## 2011-08-21 ENCOUNTER — Encounter (HOSPITAL_COMMUNITY): Payer: Self-pay | Admitting: *Deleted

## 2011-08-21 ENCOUNTER — Other Ambulatory Visit: Payer: Self-pay

## 2011-08-21 DIAGNOSIS — E669 Obesity, unspecified: Secondary | ICD-10-CM | POA: Insufficient documentation

## 2011-08-21 DIAGNOSIS — IMO0001 Reserved for inherently not codable concepts without codable children: Secondary | ICD-10-CM | POA: Insufficient documentation

## 2011-08-21 DIAGNOSIS — T50995A Adverse effect of other drugs, medicaments and biological substances, initial encounter: Secondary | ICD-10-CM | POA: Insufficient documentation

## 2011-08-21 DIAGNOSIS — R42 Dizziness and giddiness: Secondary | ICD-10-CM | POA: Insufficient documentation

## 2011-08-21 DIAGNOSIS — T398X5A Adverse effect of other nonopioid analgesics and antipyretics, not elsewhere classified, initial encounter: Secondary | ICD-10-CM | POA: Insufficient documentation

## 2011-08-21 DIAGNOSIS — F329 Major depressive disorder, single episode, unspecified: Secondary | ICD-10-CM | POA: Insufficient documentation

## 2011-08-21 DIAGNOSIS — R110 Nausea: Secondary | ICD-10-CM | POA: Insufficient documentation

## 2011-08-21 DIAGNOSIS — E785 Hyperlipidemia, unspecified: Secondary | ICD-10-CM | POA: Insufficient documentation

## 2011-08-21 DIAGNOSIS — F3289 Other specified depressive episodes: Secondary | ICD-10-CM | POA: Insufficient documentation

## 2011-08-21 DIAGNOSIS — T50905A Adverse effect of unspecified drugs, medicaments and biological substances, initial encounter: Secondary | ICD-10-CM

## 2011-08-21 MED ORDER — ONDANSETRON 8 MG PO TBDP
8.0000 mg | ORAL_TABLET | Freq: Once | ORAL | Status: AC
  Administered 2011-08-21: 8 mg via ORAL
  Filled 2011-08-21: qty 1

## 2011-08-21 MED ORDER — ONDANSETRON 8 MG PO TBDP: 8.0000 mg | ORAL_TABLET | Freq: Three times a day (TID) | ORAL | Status: AC | PRN

## 2011-08-21 NOTE — ED Notes (Signed)
Pt states "I was given suboxone for opiate addiction, took a dose yesterday and one today, I called them & told them how I was felling, they told me to cut the dose in half, I feel like I want to pass out, this feels worser than the addiction itself to me"

## 2011-08-21 NOTE — ED Provider Notes (Signed)
History     CSN: [id number]  Arrival date & time 08/21/11  1747   First MD Initiated Contact with Patient 08/21/11 1901      Chief Complaint  Patient presents with  . Drug Problem    (Consider location/radiation/quality/duration/timing/severity/associated sxs/prior treatment) HPI Comments: The patient is a 41 year old female who has a history of narcotic abuse and dependence who recently entered a drug treatment program and was prescribed Suboxone which she took the first dose of yesterday. The last time she had used narcotics was 6 days ago. Shortly after taking the first dose of Suboxone yesterday, the patient developed symptoms of lightheadedness, nausea, myalgias diffusely, restlessness, "worse than the withdrawal from narcotics". She called the drug treatment clinic which instructed her to cut the dose of Suboxone in half today. She took a dose of Suboxone around noon today and had recurrence of the same symptoms. She has had no vomiting. She denies chest pain, palpitations, shortness of breath, headache, vision change, numbness or weakness.  Patient is a 41 y.o. female presenting with drug problem. The history is provided by the patient.  Drug Problem This is a new problem. The current episode started yesterday. The problem occurs constantly. The problem has not changed since onset.Pertinent negatives include no chest pain, no abdominal pain, no headaches and no shortness of breath. Associated symptoms comments: Lightheadedness, nausea, drowsiness, restlessness. Exacerbated by: Use of Suboxone. The symptoms are relieved by nothing. She has tried nothing for the symptoms.    Past Medical History  Diagnosis Date  . Foot fracture 01-07-2011  . Hypertensive cardiomyopathy   . Depression   . Hyperlipidemia     Past Surgical History  Procedure Date  . Cesarean section     Family History  Problem Relation Age of Onset  . Hypertension Mother   . Heart disease Mother   .  Hyperlipidemia Mother   . Heart disease Father     History  Substance Use Topics  . Smoking status: Current Some Day Smoker -- 0.0 packs/day  . Smokeless tobacco: Not on file   Comment: 1 Cigarettes every other Day  . Alcohol Use: Yes     ocassionally    OB History    Grav Para Term Preterm Abortions TAB SAB Ect Mult Living                  Review of Systems  Constitutional: Positive for fatigue. Negative for fever, diaphoresis and appetite change.  HENT: Negative.   Eyes: Negative.   Respiratory: Negative for shortness of breath.   Cardiovascular: Negative for chest pain and palpitations.  Gastrointestinal: Positive for nausea. Negative for vomiting, abdominal pain and diarrhea.  Genitourinary: Negative.   Musculoskeletal: Positive for myalgias. Negative for back pain, joint swelling, arthralgias and gait problem.  Skin: Negative for color change, pallor and rash.  Neurological: Positive for light-headedness. Negative for tremors, seizures, syncope, speech difficulty, weakness, numbness and headaches.  Psychiatric/Behavioral: Negative for suicidal ideas, hallucinations, behavioral problems, confusion, self-injury and agitation. The patient is nervous/anxious.     Allergies  Review of patient's allergies indicates no known allergies.  Home Medications   Current Outpatient Rx  Name Route Sig Dispense Refill  . BUPROPION HCL ER (XL) 150 MG PO TB24 Oral Take 150 mg by mouth daily.    Marland Kitchen CLONAZEPAM 0.5 MG PO TABS Oral Take 0.5 mg by mouth daily.    Marland Kitchen HYDROCODONE-ACETAMINOPHEN 5-500 MG PO TABS Oral Take 1 tablet by mouth every 6 (six) hours  as needed.    Marland Kitchen ONDANSETRON 8 MG PO TBDP Oral Take 1 tablet (8 mg total) by mouth every 8 (eight) hours as needed for nausea. 12 tablet 0  . ZOLPIDEM TARTRATE 10 MG PO TABS Oral Take 10 mg by mouth daily.      BP 117/84  Pulse 94  Temp(Src) 98 F (36.7 C) (Oral)  Resp 17  Wt 207 lb (93.895 kg)  SpO2 100%  Physical Exam  Nursing  note and vitals reviewed. Constitutional: She is oriented to person, place, and time. She appears well-developed and well-nourished. No distress.  HENT:  Head: Normocephalic and atraumatic.  Mouth/Throat: Oropharynx is clear and moist.  Eyes: Conjunctivae and EOM are normal. Pupils are equal, round, and reactive to light.  Neck: Normal range of motion.  Cardiovascular: Normal rate, regular rhythm, normal heart sounds and intact distal pulses.  Exam reveals no gallop and no friction rub.   No murmur heard. Pulmonary/Chest: Effort normal and breath sounds normal. No respiratory distress. She has no wheezes. She has no rales. She exhibits no tenderness.  Abdominal: Soft. Bowel sounds are normal. She exhibits no distension. There is no tenderness. There is no rebound and no guarding.  Musculoskeletal: Normal range of motion. She exhibits no edema and no tenderness.  Neurological: She is alert and oriented to person, place, and time. She has normal reflexes. No cranial nerve deficit. She exhibits normal muscle tone. Coordination normal.  Skin: Skin is warm and dry. No rash noted. She is not diaphoretic. No erythema. No pallor.  Psychiatric: She has a normal mood and affect. Her behavior is normal. Judgment and thought content normal.    ED Course  Procedures (including critical care time)  Date: 08/21/2011  Rate: 76  Rhythm: normal sinus rhythm  QRS Axis: normal  Intervals: normal  ST/T Wave abnormalities: normal  Conduction Disutrbances:none  Narrative Interpretation: Non-provocative EKG, normal QT  Old EKG Reviewed: none available   Labs Reviewed - No data to display No results found.   1. Adverse effects of medication       MDM  The patient appears to be having an adverse reaction to Suboxone, likely secondary to the Narcan component.  I have ordered an EKG to assure no prolonged QT interval which there has not. Otherwise her symptoms seem attributable to narcotic withdrawal and  mild adverse drug reaction to Suboxone. I'll prescribe her Zofran for the nausea and have instructed her to followup with her drug treatment program in the morning. The patient states her understanding of and agreement with the plan of care.        Felisa Bonier, MD 08/21/11 2004

## 2011-08-21 NOTE — Assessment & Plan Note (Signed)
Estha continues to have mild to moderate obesity. We had a long discussion regarding standard remedies for obesity. I have tried to encourage her to diet and exercise.  She seems to be.for a little while but then is unable to stick to it.  I've asked her to consider using standard weight loss remedies instead of the bariatric surgery and to leave the bariatric surgery as a last resort. I think that she would be able to undergo bariatric surgery from a cardiac standpoint and would be at low risk.  For obesity does seem to be causing some problems. I think that it is the main cause of her left ventricular hypertrophy. She does not have any other reasons why she would have the left ventricular hypertrophy.

## 2011-09-16 ENCOUNTER — Ambulatory Visit: Payer: 59 | Admitting: Cardiovascular Disease

## 2011-09-16 NOTE — Telephone Encounter (Signed)
New problem:  Per Sherri need a sign ekg today. Patient having surgery on 3/27.

## 2011-09-16 NOTE — Telephone Encounter (Signed)
Faxed copy of signed ekg

## 2013-07-14 ENCOUNTER — Emergency Department (INDEPENDENT_AMBULATORY_CARE_PROVIDER_SITE_OTHER)
Admission: EM | Admit: 2013-07-14 | Discharge: 2013-07-14 | Disposition: A | Discharge status: Home / Self Care | Payer: 59 | Source: Home / Self Care | Attending: Emergency Medicine | Admitting: Emergency Medicine

## 2013-07-14 ENCOUNTER — Encounter (HOSPITAL_COMMUNITY): Payer: Self-pay | Admitting: Emergency Medicine

## 2013-07-14 ENCOUNTER — Emergency Department (INDEPENDENT_AMBULATORY_CARE_PROVIDER_SITE_OTHER): Payer: 59

## 2013-07-14 DIAGNOSIS — J111 Influenza due to unidentified influenza virus with other respiratory manifestations: Secondary | ICD-10-CM

## 2013-07-14 DIAGNOSIS — R69 Illness, unspecified: Principal | ICD-10-CM

## 2013-07-14 LAB — POCT RAPID STREP A: STREPTOCOCCUS, GROUP A SCREEN (DIRECT): NEGATIVE

## 2013-07-14 MED ORDER — HYDROCOD POLST-CHLORPHEN POLST 10-8 MG/5ML PO LQCR: 5.0000 mL | Freq: Two times a day (BID) | ORAL | Status: DC | PRN

## 2013-07-14 MED ORDER — ACETAMINOPHEN 325 MG PO TABS
650.0000 mg | ORAL_TABLET | Freq: Once | ORAL | Status: AC
  Administered 2013-07-14: 650 mg via ORAL

## 2013-07-14 MED ORDER — ACETAMINOPHEN 325 MG PO TABS
ORAL_TABLET | ORAL | Status: AC
  Filled 2013-07-14: qty 2

## 2013-07-14 MED ORDER — OSELTAMIVIR PHOSPHATE 75 MG PO CAPS: 75.0000 mg | ORAL_CAPSULE | Freq: Two times a day (BID) | ORAL | Status: DC

## 2013-07-14 NOTE — Discharge Instructions (Signed)
Most upper respiratory infections are caused by viruses and do not require antibiotics.  We try to save the antibiotics for when we really need them to prevent bacteria from developing resistance to them.  Here are a few hints about things that can be done at home to help get over an upper respiratory infection quicker: ° °Get extra sleep and extra fluids.  Get 7 to 9 hours of sleep per night and 6 to 8 glasses of water a day.  Getting extra sleep keeps the immune system from getting run down.  Most people with an upper respiratory infection are a little dehydrated.  The extra fluids also keep the secretions liquified and easier to deal with.  Also, get extra vitamin C.  4000 mg per day is the recommended dose. °For the aches, headache, and fever, acetaminophen or ibuprofen are helpful.  These can be alternated every 4 hours.  People with liver disease should avoid large amounts of acetaminophen, and people with ulcer disease, gastroesophageal reflux, gastritis, congestive heart failure, chronic kidney disease, coronary artery disease and the elderly should avoid ibuprofen. °For nasal congestion try Mucinex-D, or if you're having lots of sneezing or clear nasal drainage use Zyrtec-D. People with high blood pressure can take these if their blood pressure is controlled, if not, it's best to avoid the forms with a "D" (decongestants).  You can use the plain Mucinex, Allegra, Claritin, or Zyrtec even if your blood pressure is not controlled.   °A Saline nasal spray such as Ocean Spray can also help.  You can add a decongestant sprays such as Afrin, but you should not use the decongestant sprays for more than 3 or 4 days since they can be habituating.  Breathe Rite nasal strips can also offer a non-drug alternative treatment to nasal congestion, especially at night. °For people with symptoms of sinusitis, sleeping with your head elevated can be helpful.  For sinus pain, moist, hot compresses to the face may provide some  relief.  Many people find that inhaling steam as in a shower or from a pot of steaming water can help. °For any viral infection, zinc containing lozenges such as Cold-Eze or Zicam are helpful.  Zinc helps to fight viral infection.  Hot salt water gargles (8 oz of hot water, 1/2 tsp of table salt, and a pinch of baking soda) can give relief as well as hot beverages such as hot tea.  Sucrets extra strength lozenges will help the sore throat.  °For the cough, take Delsym 2 tsp every 12 hours.  It has also been found recently that Aleve can help control a cough.  The dose is 1 to 2 tablets twice daily with food.  This can be combined with Delsym. (Note, if you are taking ibuprofen, you should not take Aleve as well--take one or the other.) °A cool mist vaporizer will help keep your mucous membranes from drying out.  ° °It's important when you have an upper respiratory infection not to pass the infection to others.  This involves being very careful about the following: ° °Frequent hand washing or use of hand sanitizer, especially after coughing, sneezing, blowing your nose or touching your face, nose or eyes. °Do not shake hands or touch anyone and try to avoid touching surfaces that other people use such as doorknobs, shopping carts, telephones and computer keyboards. °Use tissues and dispose of them properly in a garbage can or ziplock bag. °Cough into your sleeve. °Do not let others eat or   drink after you. ° °It's also important to recognize the signs of serious illness and get evaluated if they occur: °Any respiratory infection that lasts more than 7 to 10 days.  Yellow nasal drainage and sputum are not reliable indicators of a bacterial infection, but if they last for more than 1 week, see your doctor. °Fever and sore throat can indicate strep. °Fever and cough can indicate influenza or pneumonia. °Any kind of severe symptom such as difficulty breathing, intractable vomiting, or severe pain should prompt you to see  a doctor as soon as possible. ° ° °Your body's immune system is really the thing that will get rid of this infection.  Your immune system is comprised of 2 types of specialized cells called T cells and B cells.  T cells coordinate the array of cells in your body that engulf invading bacteria or viruses while B cells orchestrate the production of antibodies that neutralize infection.  Anything we do or any medications we give you, will just strengthen your immune system or help it clear up the infection quicker.  Here are a few helpful hints to improve your immune system to help overcome this illness or to prevent future infections: °· A few vitamins can improve the health of your immune system.  That's why your diet should include plenty of fruits, vegetables, fish, nuts, and whole grains. °· Vitamin A and bet-carotene can increase the cells that fight infections (T cells and B cells).  Vitamin A is abundant in dark greens and orange vegetables such as spinach, greens, sweet potatoes, and carrots. °· Vitamin B6 contributes to the maturation of white blood cells, the cells that fight disease.  Foods with vitamin B6 include cold cereal and bananas. °· Vitamin C is credited with preventing colds because it increases white blood cells and also prevents cellular damage.  Citrus fruits, peaches and green and red bell peppers are all hight in vitamin C. °· Vitamin E is an anti-oxidant that encourages the production of natural killer cells which reject foreign invaders and B cells that produce antibodies.  Foods high in vitamin E include wheat germ, nuts and seeds. °· Foods high in omega-3 fatty acids found in foods like salmon, tuna and mackerel boost your immune system and help cells to engulf and absorb germs. °· Probiotics are good bacteria that increase your T cells.  These can be found in yogurt and are available in supplements such as Culturelle or Align. °· Moderate exercise increases the strength of your immune  system and your ability to recover from illness.  I suggest 3 to 5 moderate intensity 30 minute workouts per week.   °· Sleep is another component of maintaining a strong immune system.  It enables your body to recuperate from the day's activities, stress and work.  My recommendation is to get between 7 and 9 hours of sleep per night. °· If you smoke, try to quit completely or at least cut down.  Drink alcohol only in moderation if at all.  No more than 2 drinks daily for men or 1 for women. °· Get a flu vaccine early in the fall or if you have not gotten one yet, once this illness has run its course.  If you are over 65, a smoker, or an asthmatic, get a pneumococcal vaccine. °· My final recommendation is to maintain a healthy weight.  Excess weight can impair the immune system by interfering with the way the immune system deals with invading viruses or   bacteria. ° ° ° °Influenza, Adult °Influenza ("the flu") is a viral infection of the respiratory tract. It occurs more often in winter months because people spend more time in close contact with one another. Influenza can make you feel very sick. Influenza easily spreads from person to person (contagious). °CAUSES  °Influenza is caused by a virus that infects the respiratory tract. You can catch the virus by breathing in droplets from an infected person's cough or sneeze. You can also catch the virus by touching something that was recently contaminated with the virus and then touching your mouth, nose, or eyes. °SYMPTOMS  °Symptoms typically last 4 to 10 days and may include: °· Fever. °· Chills. °· Headache, body aches, and muscle aches. °· Sore throat. °· Chest discomfort and cough. °· Poor appetite. °· Weakness or feeling tired. °· Dizziness. °· Nausea or vomiting. °DIAGNOSIS  °Diagnosis of influenza is often made based on your history and a physical exam. A nose or throat swab test can be done to confirm the diagnosis. °RISKS AND COMPLICATIONS °You may be at risk  for a more severe case of influenza if you smoke cigarettes, have diabetes, have chronic heart disease (such as heart failure) or lung disease (such as asthma), or if you have a weakened immune system. Elderly people and pregnant women are also at risk for more serious infections. The most common complication of influenza is a lung infection (pneumonia). Sometimes, this complication can require emergency medical care and may be life-threatening. °PREVENTION  °An annual influenza vaccination (flu shot) is the best way to avoid getting influenza. An annual flu shot is now routinely recommended for all adults in the [place] °TREATMENT  °In mild cases, influenza goes away on its own. Treatment is directed at relieving symptoms. For more severe cases, your caregiver may prescribe antiviral medicines to shorten the sickness. Antibiotic medicines are not effective, because the infection is caused by a virus, not by bacteria. °HOME CARE INSTRUCTIONS °· Only take over-the-counter or prescription medicines for pain, discomfort, or fever as directed by your caregiver. °· Use a cool mist humidifier to make breathing easier. °· Get plenty of rest until your temperature returns to normal. This usually takes 3 to 4 days. °· Drink enough fluids to keep your urine clear or pale yellow. °· Cover your mouth and nose when coughing or sneezing, and wash your hands well to avoid spreading the virus. °· Stay home from work or school until your fever has been gone for at least 1 full day. °SEEK MEDICAL CARE IF:  °· You have chest pain or a deep cough that worsens or produces more mucus. °· You have nausea, vomiting, or diarrhea. °SEEK IMMEDIATE MEDICAL CARE IF:  °· You have difficulty breathing, shortness of breath, or your skin or nails turn bluish. °· You have severe neck pain or stiffness. °· You have a severe headache, facial pain, or earache. °· You have a worsening or recurring fever. °· You have nausea or vomiting that cannot be  controlled. °MAKE SURE YOU: °· Understand these instructions. °· Will watch your condition. °· Will get help right away if you are not doing well or get worse. °Document Released: 06/06/2000 Document Revised: 12/09/2011 Document Reviewed: 09/08/2011 °[employer]® Patient Information ©2014 [employer]. ° °

## 2013-07-14 NOTE — ED Provider Notes (Signed)
Chief Complaint   Chief Complaint  Patient presents with  . URI    History of Present Illness   Shannon Cervantes is a 43 year old female who has had a three-day history of cough productive yellow sputum, chest tightness, chest pain when she coughs or takes a deep breath, generalized myalgias, fever of up to 101, chills, nasal congestion with clear rhinorrhea, headache, ear congestion, sore throat, and nausea. She has had no specific sick exposures, but she works at the [employer] and is exposed to sick patients all the time. She did get the flu vaccine this year.  Review of Systems   Other than as noted above, the patient denies any of the following symptoms: Systemic:  No fevers, chills, sweats, or myalgias. Eye:  No redness or discharge. ENT:  No ear pain, headache, nasal congestion, drainage, sinus pressure, or sore throat. Neck:  No neck pain, stiffness, or swollen glands. Lungs:  No cough, sputum production, hemoptysis, wheezing, chest tightness, shortness of breath or chest pain. GI:  No abdominal pain, nausea, vomiting or diarrhea.  [place]   Past medical history, family history, social history, meds, and allergies were reviewed. She takes Wellbutrin.  Physical exam   Vital signs:  BP 132/90  Pulse 103  Temp(Src) 98.9 F (37.2 C) (Oral)  Resp 20  SpO2 100% General:  Alert and oriented.  In no distress.  Skin warm and dry. Eye:  No conjunctival injection or drainage. Lids were normal. ENT:  TMs and canals were normal, without erythema or inflammation.  Nasal mucosa was clear and uncongested, without drainage.  Mucous membranes were moist.  Pharynx was erythematous with no exudate or drainage.  There were no oral ulcerations or lesions. Neck:  Supple, no adenopathy, tenderness or mass. Lungs:  No respiratory distress.  Lungs were clear to auscultation, without wheezes, rales or rhonchi.  Breath sounds were clear and equal bilaterally.  Heart:  Regular rhythm,  without gallops, murmers or rubs. Skin:  Clear, warm, and dry, without rash or lesions.  Labs   Results for orders placed during the hospital encounter of 07/14/13  POCT RAPID STREP A ([facility] CARE ONLY)      Result Value Range   Streptococcus, Group A Screen (Direct) NEGATIVE  NEGATIVE     Radiology   Dg Chest 2 View  07/14/2013   CLINICAL DATA:  Cough, congestion  EXAM: CHEST  2 VIEW  COMPARISON:  09/26/2008  FINDINGS: The heart size and mediastinal contours are within normal limits. Both lungs are clear. The visualized skeletal structures are unremarkable.  IMPRESSION: No active cardiopulmonary disease.   Electronically Signed   By: Daryll Brod M.D.   On: 07/14/2013 12:07   Assessment     The encounter diagnosis was Influenza-like illness.  Plan    1.  Meds:  The following meds were prescribed:   New Prescriptions   CHLORPHENIRAMINE-HYDROCODONE (TUSSIONEX) 10-8 MG/5ML LQCR    Take 5 mLs by mouth every 12 (twelve) hours as needed for cough.   OSELTAMIVIR (TAMIFLU) 75 MG CAPSULE    Take 1 capsule (75 mg total) by mouth every 12 (twelve) hours.    2.  Patient Education/Counseling:  The patient was given appropriate handouts, self care instructions, and instructed in symptomatic relief.  Instructed to get extra fluids, rest, and use a cool mist vaporizer.    3.  Follow up:  The patient was told to follow up here if no better in 3 to 4 days, or  sooner if becoming worse in any way, and given some red flag symptoms such as increasing fever, difficulty breathing, chest pain, or persistent vomiting which would prompt immediate return.  Follow up here as needed.      Harden Mo, MD 07/14/13 [phone]

## 2013-07-14 NOTE — ED Notes (Signed)
C/o cold sx which started tuesday States she has a cough, runny nose, ear pain, body ache, fever/chills and sore throat States she has taking aleve gel caps, theraflu, and tylenol as tx Denies vomiting and diarrhea Has felt nausea

## 2013-07-16 LAB — CULTURE, GROUP A STREP

## 2013-10-06 ENCOUNTER — Emergency Department (HOSPITAL_COMMUNITY)
Admission: EM | Admit: 2013-10-06 | Discharge: 2013-10-06 | Disposition: A | Discharge status: Home / Self Care | Payer: 59 | Attending: Emergency Medicine | Admitting: Emergency Medicine

## 2013-10-06 ENCOUNTER — Encounter (HOSPITAL_COMMUNITY): Payer: Self-pay | Admitting: Emergency Medicine

## 2013-10-06 DIAGNOSIS — E785 Hyperlipidemia, unspecified: Secondary | ICD-10-CM | POA: Insufficient documentation

## 2013-10-06 DIAGNOSIS — F329 Major depressive disorder, single episode, unspecified: Secondary | ICD-10-CM | POA: Insufficient documentation

## 2013-10-06 DIAGNOSIS — F3289 Other specified depressive episodes: Secondary | ICD-10-CM | POA: Insufficient documentation

## 2013-10-06 DIAGNOSIS — Z79899 Other long term (current) drug therapy: Secondary | ICD-10-CM | POA: Insufficient documentation

## 2013-10-06 DIAGNOSIS — Z8781 Personal history of (healed) traumatic fracture: Secondary | ICD-10-CM | POA: Insufficient documentation

## 2013-10-06 DIAGNOSIS — F17200 Nicotine dependence, unspecified, uncomplicated: Secondary | ICD-10-CM | POA: Insufficient documentation

## 2013-10-06 DIAGNOSIS — E876 Hypokalemia: Secondary | ICD-10-CM

## 2013-10-06 DIAGNOSIS — I428 Other cardiomyopathies: Secondary | ICD-10-CM | POA: Insufficient documentation

## 2013-10-06 DIAGNOSIS — K219 Gastro-esophageal reflux disease without esophagitis: Secondary | ICD-10-CM

## 2013-10-06 DIAGNOSIS — I119 Hypertensive heart disease without heart failure: Secondary | ICD-10-CM | POA: Insufficient documentation

## 2013-10-06 LAB — CBC
HEMATOCRIT: 35.8 % — AB (ref 36.0–46.0)
Hemoglobin: 12.3 g/dL (ref 12.0–15.0)
MCH: 30.8 pg (ref 26.0–34.0)
MCHC: 34.4 g/dL (ref 30.0–36.0)
MCV: 89.7 fL (ref 78.0–100.0)
Platelets: 370 10*3/uL (ref 150–400)
RBC: 3.99 MIL/uL (ref 3.87–5.11)
RDW: 14 % (ref 11.5–15.5)
WBC: 10 10*3/uL (ref 4.0–10.5)

## 2013-10-06 LAB — BASIC METABOLIC PANEL
BUN: 10 mg/dL (ref 6–23)
CO2: 24 mEq/L (ref 19–32)
Calcium: 9.2 mg/dL (ref 8.4–10.5)
Chloride: 105 mEq/L (ref 96–112)
Creatinine, Ser: 0.56 mg/dL (ref 0.50–1.10)
GFR calc non Af Amer: >90 mL/min (ref >90)
Glucose, Bld: 99 mg/dL (ref 70–99)
POTASSIUM: 3.6 meq/L — AB (ref 3.7–5.3)
Sodium: 141 mEq/L (ref 137–147)

## 2013-10-06 LAB — I-STAT TROPONIN, ED: Troponin i, poc: 0 ng/mL (ref 0.00–0.08)

## 2013-10-06 MED ORDER — HYDROCHLOROTHIAZIDE 25 MG PO TABS: 25.0000 mg | ORAL_TABLET | Freq: Every day | ORAL | Status: DC

## 2013-10-06 NOTE — ED Notes (Signed)
Pt c/o chest pain and dizziness for a couple of days. Denies n/v. States earlier today she had a episode of SOB where she felt like her throat was closing up but it went away.

## 2013-10-06 NOTE — ED Provider Notes (Signed)
CSN: [id number]     Arrival date & time 10/06/13  1747 History   First MD Initiated Contact with Patient 10/06/13 1826     Chief Complaint  Patient presents with  . Chest Pain  . Dizziness      HPI Pt c/o chest pain and dizziness for a couple of days. Denies n/v. States earlier today she had a episode of SOB where she felt like her throat was closing up but it went away.  Patient supposedly on blood pressure medicine but she hasn't been taking them.  She also a history of GERD but is on no regular medicine for that.  Has a past history of hypertensive cardiomyopathy.  Past Medical History  Diagnosis Date  . Foot fracture 01-07-2011  . Hypertensive cardiomyopathy   . Depression   . Hyperlipidemia    Past Surgical History  Procedure Laterality Date  . Cesarean section     Family History  Problem Relation Age of Onset  . Hypertension Mother   . Heart disease Mother   . Hyperlipidemia Mother   . Heart disease Father    History  Substance Use Topics  . Smoking status: Current Some Day Smoker -- 0.01 packs/day  . Smokeless tobacco: Not on file     Comment: 1 Cigarettes every other Day  . Alcohol Use: Yes     Comment: ocassionally   OB History   Grav Para Term Preterm Abortions TAB SAB Ect Mult Living                 Review of Systems  All other systems reviewed and are negative  Allergies  Suboxone  Home Medications   Prior to Admission medications   Medication Sig Start Date End Date Taking? Authorizing Provider  buPROPion (WELLBUTRIN XL) 150 MG 24 hr tablet Take 150 mg by mouth daily.   Yes Historical Provider, MD  estazolam (PROSOM) 2 MG tablet Take 2 mg by mouth at bedtime.  10/05/13  Yes Historical Provider, MD  ibuprofen (ADVIL,MOTRIN) 200 MG tablet Take 800 mg by mouth every 6 (six) hours as needed for moderate pain.   Yes Historical Provider, MD  hydrochlorothiazide (HYDRODIURIL) 25 MG tablet Take 1 tablet (25 mg total) by mouth daily. 10/06/13   Dot Lanes, MD   BP 120/81  Pulse 88  SpO2 100% Physical Exam  Nursing note and vitals reviewed. Constitutional: She is oriented to person, place, and time. She appears well-developed and well-nourished. No distress.  HENT:  Head: Normocephalic and atraumatic.  Eyes: Pupils are equal, round, and reactive to light.  Neck: Normal range of motion.  Cardiovascular: Normal rate and intact distal pulses.   Pulmonary/Chest: No respiratory distress. She has no wheezes. She has no rales.  Abdominal: Normal appearance. She exhibits no distension. There is no tenderness. There is no rebound.  Musculoskeletal: Normal range of motion.  Neurological: She is alert and oriented to person, place, and time. No cranial nerve deficit.  Skin: Skin is warm and dry. No rash noted.  Psychiatric: She has a normal mood and affect. Her behavior is normal.    ED Course  Procedures (including critical care time) Labs Review Labs Reviewed  CBC - Abnormal; Notable for the following:    HCT 35.8 (*)    All other components within normal limits  BASIC METABOLIC PANEL - Abnormal; Notable for the following:    Potassium 3.6 (*)    All other components within normal limits  PRO B NATRIURETIC  PEPTIDE  I-STAT TROPOININ, ED    Imaging Review No results found.    Date: 10/06/2013  Rate: 86  Rhythm: normal sinus rhythm  QRS Axis: normal  Intervals: normal  ST/T Wave abnormalities: normal  Conduction Disutrbances:none:  Normal EKG     After treatment in the ED the patient feels back to baseline and wants to go home. MDM   Final diagnoses:  GERD (gastroesophageal reflux disease)  Hypokalemia        Dot Lanes, MD 10/06/13 [phone]

## 2013-10-06 NOTE — Discharge Instructions (Signed)
Diet for Gastroesophageal Reflux Disease, Adult Reflux (acid reflux) is when acid from your stomach flows up into the esophagus. When acid comes in contact with the esophagus, the acid causes irritation and soreness (inflammation) in the esophagus. When reflux happens often or so severely that it causes damage to the esophagus, it is called gastroesophageal reflux disease (GERD). Nutrition therapy can help ease the discomfort of GERD. FOODS OR DRINKS TO AVOID OR LIMIT  Smoking or chewing tobacco. Nicotine is one of the most potent stimulants to acid production in the gastrointestinal tract.  Caffeinated and decaffeinated coffee and black tea.  Regular or low-calorie carbonated beverages or energy drinks (caffeine-free carbonated beverages are allowed).   Strong spices, such as black pepper, white pepper, red pepper, cayenne, curry powder, and chili powder.  Peppermint or spearmint.  Chocolate.  High-fat foods, including meats and fried foods. Extra added fats including oils, butter, salad dressings, and nuts. Limit these to less than 8 tsp per day.  Fruits and vegetables if they are not tolerated, such as citrus fruits or tomatoes.  Alcohol.  Any food that seems to aggravate your condition. If you have questions regarding your diet, call your caregiver or a registered dietitian. OTHER THINGS THAT MAY HELP GERD INCLUDE:   Eating your meals slowly, in a relaxed setting.  Eating 5 to 6 small meals per day instead of 3 large meals.  Eliminating food for a period of time if it causes distress.  Not lying down until 3 hours after eating a meal.  Keeping the head of your bed raised 6 to 9 inches (15 to 23 cm) by using a foam wedge or blocks under the legs of the bed. Lying flat may make symptoms worse.  Being physically active. Weight loss may be helpful in reducing reflux in overweight or obese adults.  Wear loose fitting clothing EXAMPLE MEAL PLAN This meal plan is approximately  2,000 calories based on [email or web address] meal planning guidelines. Breakfast   cup cooked oatmeal.  1 cup strawberries.  1 cup low-fat milk.  1 oz almonds. Snack  1 cup cucumber slices.  6 oz yogurt (made from low-fat or fat-free milk). Lunch  2 slice whole-wheat bread.  2 oz sliced [place].  2 tsp mayonnaise.  1 cup blueberries.  1 cup snap peas. Snack  6 whole-wheat crackers.  1 oz string cheese. Dinner   cup brown rice.  1 cup mixed veggies.  1 tsp olive oil.  3 oz grilled fish. Document Released: 06/09/2005 Document Revised: 09/01/2011 Document Reviewed: 04/25/2011 [facility] Patient Information 2014 [place], [place].  Hypokalemia Hypokalemia means that the amount of potassium in the blood is lower than normal.Potassium is a chemical, called an electrolyte, that helps regulate the amount of fluid in the body. It also stimulates muscle contraction and helps nerves function properly.Most of the body's potassium is inside of cells, and only a very small amount is in the blood. Because the amount in the blood is so small, minor changes can be life-threatening. CAUSES  Antibiotics.  Diarrhea or vomiting.  Using laxatives too much, which can cause diarrhea.  Chronic kidney disease.  Water pills (diuretics).  Eating disorders (bulimia).  Low magnesium level.  Sweating a lot. SIGNS AND SYMPTOMS  Weakness.  Constipation.  Fatigue.  Muscle cramps.  Mental confusion.  Skipped heartbeats or irregular heartbeat (palpitations).  Tingling or numbness. DIAGNOSIS  Your health care provider can diagnose hypokalemia with blood tests. In addition to checking your potassium level, your health care provider  may also check other lab tests. TREATMENT Hypokalemia can be treated with potassium supplements taken by mouth or adjustments in your current medicines. If your potassium level is very low, you may need to get potassium through a vein (IV) and be  monitored in the hospital. A diet high in potassium is also helpful. Foods high in potassium are:  Nuts, such as peanuts and pistachios.  Seeds, such as sunflower seeds and pumpkin seeds.  Peas, lentils, and lima beans.  Whole grain and bran cereals and breads.  Fresh fruit and vegetables, such as apricots, avocado, bananas, cantaloupe, kiwi, oranges, tomatoes, asparagus, and potatoes.  Orange and tomato juices.  Red meats.  Fruit yogurt. HOME CARE INSTRUCTIONS  Take all medicines as prescribed by your health care provider.  Maintain a healthy diet by including nutritious food, such as fruits, vegetables, nuts, whole grains, and lean meats.  If you are taking a laxative, be sure to follow the directions on the label. SEEK MEDICAL CARE IF:  Your weakness gets worse.  You feel your heart pounding or racing.  You are vomiting or having diarrhea.  You are diabetic and having trouble keeping your blood glucose in the normal range. SEEK IMMEDIATE MEDICAL CARE IF:  You have chest pain, shortness of breath, or dizziness.  You are vomiting or having diarrhea for more than 2 days.  You faint. MAKE SURE YOU:   Understand these instructions.  Will watch your condition.  Will get help right away if you are not doing well or get worse. Document Released: 06/09/2005 Document Revised: 03/30/2013 Document Reviewed: 12/10/2012 [facility] Patient Information 2014 [place].  Potassium Content of Foods Potassium is a mineral found in many foods and drinks. It helps keep fluids and minerals balanced in your body and also affects how steadily your heart beats. The body needs potassium to control blood pressure and to keep the muscles and nervous system healthy. However, certain health conditions and medicine may require you to eat more or less potassium-rich foods and drinks. Your caregiver or dietitian will tell you how much potassium you should have each day. COMMON SERVING  SIZES The list below tells you how big or small common portion sizes are:  1 oz.........4 stacked dice.  3 oz........Marland KitchenDeck of cards.  1 tsp.......Marland KitchenTip of little finger.  1 tbsp....Marland KitchenMarland KitchenThumb.  2 tbsp....Marland KitchenMarland KitchenGolf ball.   c..........Marland KitchenHalf of a fist.  1 c...........Marland KitchenA fist. FOODS AND DRINKS HIGH IN POTASSIUM More than 200 mg of potassium per serving. A serving size is  c (120 mL or noted gram weight) unless otherwise stated. While all the items on this list are high in potassium, some items are higher in potassium than others. Fruits  Apricots (sliced), 83 g.  Apricots (dried halves), 3 oz / 24 g.  Avocado (cubed),  c / 50 g.  Banana (sliced), 75 g.  Cantaloupe (cubed), 80 g.  Dates (pitted), 5 whole / 35 g.  Figs (dried), 4 whole / 32 g.  Guava, c / 55 g.  Honeydew, 1 wedge / 85 g.  Kiwi (sliced), 90 g.  Nectarine, 1 small / 129 g.  Orange, 1 medium / 131 g.  Orange juice.  Pomegranate seeds, 87 g.  Pomegranate juice.  Prunes (pitted), 3 whole / 30 g.  Prune juice, 3 oz / 90 mL.  Seedless raisins, 3 tbsp / 27 g. Vegetables  Artichoke,  of a medium / 64 g.  Asparagus (boiled), 90 g.  Baked beans,  c / 63 g.  Bamboo shoots,  c / 38 g.  Beets (cooked slices), 85 g.  Broccoli (boiled), 78 g.  Brussels sprout (boiled), 78 g.  Butternut squash (baked), 103 g.  Chickpea (cooked), 82 g.  Green peas (cooked), 80 g.  Hubbard squash (baked cubes),  c / 68 g.  Kidney beans (cooked), 5 tbsp / 55 g.  Lima beans (cooked),  c / 43 g.  Navy beans (cooked),  c / 61 g.  Potato (baked), 61 g.  Potato (boiled), 78 g.  Pumpkin (boiled), 123 g.  Refried beans,  c / 79 g.  Spinach (cooked),  c / 45 g.  Split peas (cooked),  c / 65 g.  Sun-dried tomatoes, 2 tbsp / 7 g.  Sweet potato (baked),  c / 50 g.  Tomato (chopped or sliced), 90 g.  Tomato juice.  Tomato paste, 4 tsp / 21 g.  Tomato sauce,  c / 61 g.  Vegetable juice.  White  mushrooms (cooked), 78 g.  Yam (cooked or baked),  c / 34 g.  Zucchini squash (boiled), 90 g. Other Foods and Drinks  Almonds (whole),  c / 36 g.  Cashews (oil roasted),  c / 32 g.  Chocolate milk.  Chocolate pudding, 142 g.  Clams (steamed), 1.5 oz / 43 g.  Dark chocolate, 1.5 oz / 42 g.  Fish, 3 oz / 85 g.  King crab (steamed), 3 oz / 85 g.  Lobster (steamed), 4 oz / 113 g.  Milk (skim, 1%, 2%, whole), 1 c / 240 mL.  Milk chocolate, 2.3 oz / 66 g.  Milk shake.  Nonfat fruit variety yogurt, 123 g.  Peanuts (oil roasted), 1 oz / 28 g.  Peanut butter, 2 tbsp / 32 g.  Pistachio nuts, 1 oz / 28 g.  Pumpkin seeds, 1 oz / 28 g.  Red meat (broiled, cooked, grilled), 3 oz / 85 g.  Scallops (steamed), 3 oz / 85 g.  Shredded wheat cereal (dry), 3 oblong biscuits / 75 g.  Spaghetti sauce,  c / 66 g.  Sunflower seeds (dry roasted), 1 oz / 28 g.  Veggie burger, 1 patty / 70 g. FOODS MODERATE IN POTASSIUM Between 150 mg and 200 mg per serving. A serving is  c (120 mL or noted gram weight) unless otherwise stated. Fruits  Grapefruit,  of the fruit / 123 g.  Grapefruit juice.  Pineapple juice.  Plums (sliced), 83 g.  Tangerine, 1 large / 120 g. Vegetables  Carrots (boiled), 78 g.  Carrots (sliced), 61 g.  Rhubarb (cooked with sugar), 120 g.  R[place]aga (cooked), 120 g.  Sweet corn (cooked), 75 g.  Yellow snap beans (cooked), 63 g. Other Foods and Drinks   Bagel, 1 bagel / 98 g.  Chicken breast (roasted and chopped),  c / 70 g.  Chocolate ice cream / 66 g.  Pita bread, 1 large / 64 g.  Shrimp (steamed), 4 oz / 113 g.  Swiss cheese (diced), 70 g.  Vanilla ice cream, 66 g.  Vanilla pudding, 140 g. FOODS LOW IN POTASSIUM Less than 150 mg per serving. A serving size is  cup (120 mL or noted gram weight) unless otherwise stated. If you eat more than 1 serving of a food low in potassium, the food may be considered a food high in  potassium. Fruits  Apple (slices), 55 g.  Apple juice.  Applesauce, 122 g.  Blackberries, 72 g.  Blueberries, 74 g.  Cranberries, 50  g.  Cranberry juice.  Fruit cocktail, 119 g.  Fruit punch.  Grapes, 46 g.  Grape juice.  Mandarin oranges (canned), 126 g.  Peach (slices), 77 g.  Pineapple (chunks), 83 g.  Raspberries, 62 g.  Red cherries (without pits), 78 g.  Strawberries (sliced), 83 g.  Watermelon (diced), 76 g. Vegetables  Alfalfa sprouts, 17 g.  Bell peppers (sliced), 46 g.  Cabbage (shredded), 35 g.  Cauliflower (boiled), 62 g.  Celery, 51 g.  Collard greens (boiled), 95 g.  Cucumber (sliced), 52 g.  Eggplant (cubed), 41 g.  Green beans (boiled), 63 g.  Lettuce (shredded), 1 c / 36 g.  Onions (sauteed), 44 g.  Radishes (sliced), 58 g.  Spaghetti squash, 51 g. Other Foods and Drinks  [employer], 1 slice / 28 g.  Black tea.  Brown rice (cooked), 98 g.  Butter croissant, 1 medium / 57 g.  Carbonated soda.  Coffee.  Cheddar cheese (diced), 66 g.  Corn flake cereal (dry), 14 g.  Cottage cheese, 118 g.  Cream of rice cereal (cooked), 122 g.  Cream of wheat cereal (cooked), 126 g.  Crisped rice cereal (dry), 14 g.  Egg (boiled, fried, poached, omelet, scrambled), 1 large / 46 61 g.  English muffin, 1 muffin / 57 g.  Frozen ice pop, 1 pop / 55 g.  Graham cracker, 1 large rectangular cracker / 14 g.  Jelly beans, 112 g.  Non-dairy whipped topping.  Oatmeal, 88 g.  Orange sherbet, 74 g.  Puffed rice cereal (dry), 7 g.  Pasta (cooked), 70 g.  Rice cakes, 4 cakes / 36 g.  Sugared doughnut, 4 oz / 116 g.  White bread, 1 slice / 30 g.  White rice (cooked), 79 93 g.  Wild rice (cooked), 82 g.  Yellow cake, 1 slice / 68 g. Document Released: 01/21/2005 Document Revised: 05/26/2012 Document Reviewed: 10/24/2011 [facility] Patient Information 2014 [place].

## 2013-10-20 ENCOUNTER — Telehealth: Payer: Self-pay | Admitting: Cardiovascular Disease

## 2013-10-20 NOTE — Telephone Encounter (Signed)
LMTCB

## 2013-10-20 NOTE — Telephone Encounter (Signed)
New Message  Pt called. Requests a call back to discuss random moments of chest pain, high BP and low potassium levels. Made an appt with Dr. Acie Fredrickson on 11/21/2013 at 3:45 pm. Pt declined PA and NP, requests a sooner appt.. Please assist.

## 2013-10-21 NOTE — Telephone Encounter (Signed)
Follow up     Returned nurses call.  She said she has a new phone and cannot tell when a call is coming in.  She will get someone to help her so that she can get your call.

## 2013-10-21 NOTE — Telephone Encounter (Signed)
Left message for patient to call back to discuss concerns.

## 2013-10-21 NOTE — Telephone Encounter (Signed)
Left message for patient to call me back; advised her I will be leaving the office at 2 pm today

## 2013-10-21 NOTE — Telephone Encounter (Signed)
Follow up     Patient calling stating having phone issues .

## 2013-10-21 NOTE — Telephone Encounter (Signed)
Follow up ° ° ° ° °Returning a nurses call °

## 2013-10-21 NOTE — Telephone Encounter (Signed)
Spoke with patient who states she has been having worsening palpitations and feeling poorly over the past 2 months.  Patient states yesterday she was at work and felt so poorly that she was made to leave work and was not allowed to drive home due to elevated BP of 160/100 and elevated heart rate (patient does not know bpm).  Patient reports that she went to [facility] ED last week (note says 4/16) and was diagnosed with hypokalemia and was placed on HCTZ which she is taking.  Patient states she was advised to follow a potassium-rich diet, which she has been doing.  Patient called and made an appointment for June 1 with Dr. Acie Fredrickson but would like to be seen sooner.  I advised patient that the earliest time I can add her on is May 13 and that if her symptoms worsen, that she should return to the hospital or call Monday to be scheduled with one of our practitioners.  I advised patient to avoid caffeine and to stay well hydrated with water. Patient verbalized understanding and agreement.

## 2013-10-31 ENCOUNTER — Encounter: Payer: Self-pay | Admitting: Cardiovascular Disease

## 2013-11-02 ENCOUNTER — Encounter: Payer: Self-pay | Admitting: Cardiology

## 2013-11-02 ENCOUNTER — Encounter: Payer: Self-pay | Admitting: Cardiovascular Disease

## 2013-11-02 ENCOUNTER — Ambulatory Visit (INDEPENDENT_AMBULATORY_CARE_PROVIDER_SITE_OTHER): Payer: 59 | Admitting: Cardiovascular Disease

## 2013-11-02 VITALS — BP 134/91 | HR 93 | Ht 63.0 in | Wt 172.4 lb

## 2013-11-02 DIAGNOSIS — R079 Chest pain, unspecified: Secondary | ICD-10-CM

## 2013-11-02 NOTE — Progress Notes (Signed)
Shannon Cervantes Date of Birth  [birth year] [place] [facility] [address]    [place]   [place], [place]  [address]    [place], [place]  [address] [phone]  Fax  [phone]  [phone]  Fax [phone]  Problem List: 1. Hypertensive Cardiolyopathy 2. Hyperlipidemia 3. Obesity - s/p gastric bypass  History of Present Illness:  Shannon Cervantes is a 43 year old female who I saw several years ago. She has a history of obesity. She has had an echo in the past which revealed mild left ventricular hypertrophy which I suspect is due to the obesity. She's not had any additional problems and in fact has not been back to see me in quite some time. She's not had any cardiac lites.  She's had lots of difficulty losing weight. She's tried Weight Watchers and other medical weight loss clinics. She would like to consider bariatric surgery. She was asked to get cardiac clearance before she could be considered for bariatric surgery.  She denies any chest pain or shortness of breath.  Nov 02, 2013:  Shannon Cervantes was seen several years ago for HTN and obesity.  She had gastric bypass and lost lots of weight.  She has had trouble with alcoholism in the past and has been going to [place].  Her sponsor died this past year and she has been depressed and has been drinking significantly more.   She drinks [place] - anywhere from 1 pint to 4-5 shots a night.  Over this past weekend, she drank 3 pints of [place].    She went to the ER April 16 for CP and was noted to have PVCs.  She was advised to come here for follow up.   Current Outpatient Prescriptions  Medication Sig Dispense Refill  . buPROPion (WELLBUTRIN XL) 150 MG 24 hr tablet Take 150 mg by mouth daily.      Marland Kitchen ibuprofen (ADVIL,MOTRIN) 200 MG tablet Take 800 mg by mouth every 6 (six) hours as needed for moderate pain.       No current facility-administered medications for this visit.     Allergies  Allergen  Reactions  . Suboxone [Buprenorphine Hcl-Naloxone Hcl] Shortness Of Breath    dizziness     Past Medical History  Diagnosis Date  . Foot fracture 01-07-2011  . Hypertensive cardiomyopathy   . Depression   . Hyperlipidemia     Past Surgical History  Procedure Laterality Date  . Cesarean section      History  Smoking status  . Current Some Day Smoker -- 0.01 packs/day  Smokeless tobacco  . Not on file    Comment: 1 Cigarettes every other Day    History  Alcohol Use  . Yes    Comment: ocassionally    Family History  Problem Relation Age of Onset  . Hypertension Mother   . Heart disease Mother   . Hyperlipidemia Mother   . Heart disease Father     Reviw of Systems:  Reviewed in the HPI.  All other systems are negative.  Physical Exam: Blood pressure 134/91, pulse 93, height 5\' 3"  (1.6 m), weight 172 lb 6.4 oz (78.2 kg). General: Well developed, well nourished, in no acute distress. She is mildly to moderately obese.  Head: Normocephalic, atraumatic, sclera non-icteric, mucus membranes are moist,   Neck: Supple. Carotids are 2 + without bruits. No JVD  Lungs: Clear bilaterally to auscultation.  Heart: regular rate  With normal  S1 S2. No murmurs, gallops or rubs.  Abdomen: Soft, non-tender, non-distended with normal bowel sounds. No hepatomegaly. No rebound/guarding. No masses. She is moderately obese.  Msk:  Strength and tone are normal  Extremities: No clubbing or cyanosis. No edema.  Distal pedal pulses are 2+ and equal bilaterally.  Neuro: Alert and oriented X 3. Moves all extremities spontaneously.  Psych:  Responds to questions appropriately with a normal affect.  ECG: NSR, nonspecific ST abnormalities.   Assessment / Plan:

## 2013-11-02 NOTE — Assessment & Plan Note (Signed)
Shannon Cervantes presented to the emergency room about a month ago with episodes of chest pain and palpitations. She had been drinking a lot of alcohol. She was going through a mild withdrawal. She was found to have hypokalemia.  Her blood pressure is mildly elevated today. I think that 100% of her problem is her excessive alcohol abuse. I recommended that she try drinking V8 juice on occasion. This will help supplement her potassium. She has not been involved in Alcoholics Anonymous for about a year since her sponsor.Shannon Cervantes encouraged her to get back involved with AA and to try to stay clean. At this point I do not recommend any further cardiac medications  I'll see her as needed

## 2013-11-02 NOTE — Patient Instructions (Signed)
Be Good to Yourself....   Return if needed.

## 2013-11-21 ENCOUNTER — Ambulatory Visit: Payer: 59 | Admitting: Cardiovascular Disease

## 2013-12-13 ENCOUNTER — Other Ambulatory Visit: Payer: Self-pay | Admitting: Obstetrics and Gynecology

## 2013-12-13 DIAGNOSIS — Z1231 Encounter for screening mammogram for malignant neoplasm of breast: Secondary | ICD-10-CM

## 2013-12-28 ENCOUNTER — Ambulatory Visit: Payer: Self-pay | Admitting: Obstetrics

## 2013-12-29 ENCOUNTER — Ambulatory Visit: Payer: Self-pay | Admitting: Obstetrics

## 2013-12-30 ENCOUNTER — Ambulatory Visit
Admission: RE | Admit: 2013-12-30 | Discharge: 2013-12-30 | Disposition: A | Discharge status: Home / Self Care | Payer: 59 | Source: Ambulatory Visit | Attending: Obstetrics and Gynecology | Admitting: Obstetrics and Gynecology

## 2013-12-30 ENCOUNTER — Encounter (INDEPENDENT_AMBULATORY_CARE_PROVIDER_SITE_OTHER): Payer: Self-pay

## 2013-12-30 DIAGNOSIS — Z1231 Encounter for screening mammogram for malignant neoplasm of breast: Secondary | ICD-10-CM

## 2014-03-18 ENCOUNTER — Inpatient Hospital Stay (HOSPITAL_COMMUNITY)
Admission: AD | Admit: 2014-03-18 | Discharge: 2014-03-21 | DRG: 897 | Disposition: A | Discharge status: Home / Self Care | Payer: 59 | Source: Intra-hospital | Attending: Psychiatry | Admitting: Psychiatry

## 2014-03-18 ENCOUNTER — Encounter (HOSPITAL_COMMUNITY): Payer: Self-pay | Admitting: Emergency Medicine

## 2014-03-18 ENCOUNTER — Encounter (HOSPITAL_COMMUNITY): Payer: Self-pay | Admitting: *Deleted

## 2014-03-18 ENCOUNTER — Emergency Department (HOSPITAL_COMMUNITY): Payer: 59

## 2014-03-18 ENCOUNTER — Emergency Department (HOSPITAL_COMMUNITY)
Admission: EM | Admit: 2014-03-18 | Discharge: 2014-03-18 | Disposition: A | Discharge status: Other Acute Inpatient Hospital | Payer: 59 | Attending: Emergency Medicine | Admitting: Emergency Medicine

## 2014-03-18 DIAGNOSIS — R0602 Shortness of breath: Secondary | ICD-10-CM | POA: Insufficient documentation

## 2014-03-18 DIAGNOSIS — E785 Hyperlipidemia, unspecified: Secondary | ICD-10-CM | POA: Diagnosis present

## 2014-03-18 DIAGNOSIS — F1010 Alcohol abuse, uncomplicated: Secondary | ICD-10-CM | POA: Insufficient documentation

## 2014-03-18 DIAGNOSIS — F1310 Sedative, hypnotic or anxiolytic abuse, uncomplicated: Secondary | ICD-10-CM | POA: Diagnosis not present

## 2014-03-18 DIAGNOSIS — Z79899 Other long term (current) drug therapy: Secondary | ICD-10-CM | POA: Insufficient documentation

## 2014-03-18 DIAGNOSIS — F3289 Other specified depressive episodes: Secondary | ICD-10-CM | POA: Insufficient documentation

## 2014-03-18 DIAGNOSIS — F32A Depression, unspecified: Secondary | ICD-10-CM

## 2014-03-18 DIAGNOSIS — Z8781 Personal history of (healed) traumatic fracture: Secondary | ICD-10-CM | POA: Insufficient documentation

## 2014-03-18 DIAGNOSIS — F10230 Alcohol dependence with withdrawal, uncomplicated: Secondary | ICD-10-CM

## 2014-03-18 DIAGNOSIS — Z3202 Encounter for pregnancy test, result negative: Secondary | ICD-10-CM | POA: Diagnosis not present

## 2014-03-18 DIAGNOSIS — F332 Major depressive disorder, recurrent severe without psychotic features: Secondary | ICD-10-CM | POA: Diagnosis present

## 2014-03-18 DIAGNOSIS — F329 Major depressive disorder, single episode, unspecified: Secondary | ICD-10-CM | POA: Insufficient documentation

## 2014-03-18 DIAGNOSIS — F17200 Nicotine dependence, unspecified, uncomplicated: Secondary | ICD-10-CM | POA: Insufficient documentation

## 2014-03-18 DIAGNOSIS — R002 Palpitations: Secondary | ICD-10-CM | POA: Insufficient documentation

## 2014-03-18 DIAGNOSIS — Z8249 Family history of ischemic heart disease and other diseases of the circulatory system: Secondary | ICD-10-CM

## 2014-03-18 DIAGNOSIS — I119 Hypertensive heart disease without heart failure: Secondary | ICD-10-CM | POA: Diagnosis present

## 2014-03-18 DIAGNOSIS — IMO0002 Reserved for concepts with insufficient information to code with codable children: Secondary | ICD-10-CM

## 2014-03-18 DIAGNOSIS — F410 Panic disorder [episodic paroxysmal anxiety] without agoraphobia: Secondary | ICD-10-CM | POA: Insufficient documentation

## 2014-03-18 DIAGNOSIS — F1020 Alcohol dependence, uncomplicated: Secondary | ICD-10-CM | POA: Diagnosis present

## 2014-03-18 DIAGNOSIS — R000 Tachycardia, unspecified: Secondary | ICD-10-CM | POA: Diagnosis not present

## 2014-03-18 DIAGNOSIS — F4323 Adjustment disorder with mixed anxiety and depressed mood: Secondary | ICD-10-CM | POA: Diagnosis present

## 2014-03-18 DIAGNOSIS — F331 Major depressive disorder, recurrent, moderate: Secondary | ICD-10-CM

## 2014-03-18 DIAGNOSIS — G4700 Insomnia, unspecified: Secondary | ICD-10-CM | POA: Diagnosis present

## 2014-03-18 DIAGNOSIS — R079 Chest pain, unspecified: Secondary | ICD-10-CM | POA: Insufficient documentation

## 2014-03-18 DIAGNOSIS — I43 Cardiomyopathy in diseases classified elsewhere: Secondary | ICD-10-CM | POA: Diagnosis present

## 2014-03-18 DIAGNOSIS — Z23 Encounter for immunization: Secondary | ICD-10-CM

## 2014-03-18 DIAGNOSIS — F1994 Other psychoactive substance use, unspecified with psychoactive substance-induced mood disorder: Secondary | ICD-10-CM | POA: Diagnosis present

## 2014-03-18 DIAGNOSIS — F4310 Post-traumatic stress disorder, unspecified: Secondary | ICD-10-CM | POA: Diagnosis present

## 2014-03-18 HISTORY — DX: Anxiety disorder, unspecified: F41.9

## 2014-03-18 HISTORY — DX: Alcohol abuse, uncomplicated: F10.10

## 2014-03-18 LAB — URINALYSIS, ROUTINE W REFLEX MICROSCOPIC
BILIRUBIN URINE: NEGATIVE
GLUCOSE, UA: NEGATIVE mg/dL
Ketones, ur: NEGATIVE mg/dL
Leukocytes, UA: NEGATIVE
Nitrite: NEGATIVE
PH: 6 (ref 5.0–8.0)
Protein, ur: NEGATIVE mg/dL
SPECIFIC GRAVITY, URINE: 1.028 (ref 1.005–1.030)
Urobilinogen, UA: 1 mg/dL (ref 0.0–1.0)

## 2014-03-18 LAB — URINE MICROSCOPIC-ADD ON

## 2014-03-18 LAB — CBC WITH DIFFERENTIAL/PLATELET
BASOS PCT: 1 % (ref 0–1)
Basophils Absolute: 0.1 10*3/uL (ref 0.0–0.1)
EOS PCT: 4 % (ref 0–5)
Eosinophils Absolute: 0.2 10*3/uL (ref 0.0–0.7)
HCT: 36.7 % (ref 36.0–46.0)
Hemoglobin: 12.5 g/dL (ref 12.0–15.0)
Lymphocytes Relative: 35 % (ref 12–46)
Lymphs Abs: 2.3 10*3/uL (ref 0.7–4.0)
MCH: 31 pg (ref 26.0–34.0)
MCHC: 34.1 g/dL (ref 30.0–36.0)
MCV: 91.1 fL (ref 78.0–100.0)
MONO ABS: 0.5 10*3/uL (ref 0.1–1.0)
Monocytes Relative: 7 % (ref 3–12)
Neutro Abs: 3.5 10*3/uL (ref 1.7–7.7)
Neutrophils Relative %: 53 % (ref 43–77)
PLATELETS: 351 10*3/uL (ref 150–400)
RBC: 4.03 MIL/uL (ref 3.87–5.11)
RDW: 13.5 % (ref 11.5–15.5)
WBC: 6.6 10*3/uL (ref 4.0–10.5)

## 2014-03-18 LAB — COMPREHENSIVE METABOLIC PANEL
ALBUMIN: 4.1 g/dL (ref 3.5–5.2)
ALT: 15 U/L (ref 0–35)
ANION GAP: 13 (ref 5–15)
AST: 22 U/L (ref 0–37)
Alkaline Phosphatase: 89 U/L (ref 39–117)
BILIRUBIN TOTAL: 0.4 mg/dL (ref 0.3–1.2)
BUN: 11 mg/dL (ref 6–23)
CALCIUM: 9.2 mg/dL (ref 8.4–10.5)
CHLORIDE: 106 meq/L (ref 96–112)
CO2: 22 mEq/L (ref 19–32)
CREATININE: 0.58 mg/dL (ref 0.50–1.10)
GFR calc Af Amer: >90 mL/min (ref >90)
GFR calc non Af Amer: >90 mL/min (ref >90)
Glucose, Bld: 84 mg/dL (ref 70–99)
Potassium: 4 mEq/L (ref 3.7–5.3)
Sodium: 141 mEq/L (ref 137–147)
Total Protein: 8 g/dL (ref 6.0–8.3)

## 2014-03-18 LAB — RAPID URINE DRUG SCREEN, HOSP PERFORMED
Amphetamines: NOT DETECTED
BARBITURATES: NOT DETECTED
BENZODIAZEPINES: POSITIVE — AB
COCAINE: NOT DETECTED
Opiates: NOT DETECTED
Tetrahydrocannabinol: NOT DETECTED

## 2014-03-18 LAB — POC URINE PREG, ED: PREG TEST UR: NEGATIVE

## 2014-03-18 LAB — TROPONIN I

## 2014-03-18 LAB — LIPASE, BLOOD: LIPASE: 24 U/L (ref 11–59)

## 2014-03-18 LAB — D-DIMER, QUANTITATIVE: D-Dimer, Quant: 0.32 ug/mL-FEU (ref 0.00–0.48)

## 2014-03-18 MED ORDER — SODIUM CHLORIDE 0.9 % IV BOLUS (SEPSIS)
500.0000 mL | Freq: Once | INTRAVENOUS | Status: AC
  Administered 2014-03-18: 500 mL via INTRAVENOUS

## 2014-03-18 MED ORDER — ONDANSETRON 4 MG PO TBDP
4.0000 mg | ORAL_TABLET | Freq: Four times a day (QID) | ORAL | Status: DC | PRN
  Administered 2014-03-19: 4 mg via ORAL
  Filled 2014-03-18: qty 1

## 2014-03-18 MED ORDER — LOPERAMIDE HCL 2 MG PO CAPS: 2.0000 mg | ORAL_CAPSULE | ORAL | Status: DC | PRN

## 2014-03-18 MED ORDER — VITAMIN B-1 100 MG PO TABS: 100.0000 mg | ORAL_TABLET | Freq: Every day | ORAL | Status: DC

## 2014-03-18 MED ORDER — LORAZEPAM 2 MG/ML IJ SOLN: 0.0000 mg | Freq: Two times a day (BID) | INTRAMUSCULAR | Status: DC

## 2014-03-18 MED ORDER — LORAZEPAM 1 MG PO TABS
0.0000 mg | ORAL_TABLET | Freq: Two times a day (BID) | ORAL | Status: DC
  Administered 2014-03-18: 2 mg via ORAL
  Filled 2014-03-18: qty 2

## 2014-03-18 MED ORDER — THIAMINE HCL 100 MG/ML IJ SOLN
INTRAMUSCULAR | Status: AC
  Administered 2014-03-18: 100 mg via INTRAMUSCULAR
  Filled 2014-03-18: qty 2

## 2014-03-18 MED ORDER — ONDANSETRON 4 MG PO TBDP: 4.0000 mg | ORAL_TABLET | Freq: Four times a day (QID) | ORAL | Status: DC | PRN

## 2014-03-18 MED ORDER — LORAZEPAM 2 MG/ML IJ SOLN: 0.0000 mg | Freq: Four times a day (QID) | INTRAMUSCULAR | Status: DC

## 2014-03-18 MED ORDER — THIAMINE HCL 100 MG/ML IJ SOLN
100.0000 mg | Freq: Once | INTRAMUSCULAR | Status: DC
  Administered 2014-03-18: 100 mg via INTRAMUSCULAR

## 2014-03-18 MED ORDER — ADULT MULTIVITAMIN W/MINERALS CH
1.0000 | ORAL_TABLET | Freq: Every day | ORAL | Status: DC
  Administered 2014-03-19: 1 via ORAL
  Filled 2014-03-18 (×2): qty 1

## 2014-03-18 MED ORDER — VITAMIN B-1 100 MG PO TABS
100.0000 mg | ORAL_TABLET | Freq: Every day | ORAL | Status: DC
  Administered 2014-03-18: 100 mg via ORAL
  Filled 2014-03-18: qty 1

## 2014-03-18 MED ORDER — THIAMINE HCL 100 MG/ML IJ SOLN: 100.0000 mg | Freq: Every day | INTRAMUSCULAR | Status: DC

## 2014-03-18 MED ORDER — ALUM & MAG HYDROXIDE-SIMETH 200-200-20 MG/5ML PO SUSP: 30.0000 mL | ORAL | Status: DC | PRN

## 2014-03-18 MED ORDER — ADULT MULTIVITAMIN W/MINERALS CH: 1.0000 | ORAL_TABLET | Freq: Every day | ORAL | Status: DC

## 2014-03-18 MED ORDER — MAGNESIUM HYDROXIDE 400 MG/5ML PO SUSP: 30.0000 mL | Freq: Every day | ORAL | Status: DC | PRN

## 2014-03-18 MED ORDER — LORAZEPAM 1 MG PO TABS: 0.0000 mg | ORAL_TABLET | Freq: Two times a day (BID) | ORAL | Status: DC

## 2014-03-18 MED ORDER — LORAZEPAM 1 MG PO TABS: 0.0000 mg | ORAL_TABLET | Freq: Four times a day (QID) | ORAL | Status: DC

## 2014-03-18 MED ORDER — LOPERAMIDE HCL 2 MG PO CAPS
2.0000 mg | ORAL_CAPSULE | ORAL | Status: DC | PRN
  Administered 2014-03-18: 2 mg via ORAL
  Filled 2014-03-18: qty 1

## 2014-03-18 MED ORDER — CHLORDIAZEPOXIDE HCL 25 MG PO CAPS: 25.0000 mg | ORAL_CAPSULE | Freq: Four times a day (QID) | ORAL | Status: DC | PRN

## 2014-03-18 MED ORDER — DIPHENHYDRAMINE HCL 25 MG PO CAPS
100.0000 mg | ORAL_CAPSULE | Freq: Every evening | ORAL | Status: DC | PRN
  Administered 2014-03-18: 100 mg via ORAL
  Filled 2014-03-18: qty 4

## 2014-03-18 MED ORDER — THIAMINE HCL 100 MG/ML IJ SOLN: 100.0000 mg | Freq: Once | INTRAMUSCULAR | Status: DC

## 2014-03-18 MED ORDER — VITAMIN B-1 100 MG PO TABS
100.0000 mg | ORAL_TABLET | Freq: Every day | ORAL | Status: DC
  Administered 2014-03-19: 100 mg via ORAL
  Filled 2014-03-18 (×2): qty 1

## 2014-03-18 MED ORDER — CHLORDIAZEPOXIDE HCL 25 MG PO CAPS
25.0000 mg | ORAL_CAPSULE | Freq: Four times a day (QID) | ORAL | Status: DC | PRN
  Administered 2014-03-18 – 2014-03-19 (×2): 25 mg via ORAL
  Filled 2014-03-18 (×2): qty 1

## 2014-03-18 MED ORDER — HYDROXYZINE HCL 25 MG PO TABS
25.0000 mg | ORAL_TABLET | Freq: Four times a day (QID) | ORAL | Status: DC | PRN
  Administered 2014-03-19: 25 mg via ORAL
  Filled 2014-03-18: qty 1

## 2014-03-18 MED ORDER — HYDROXYZINE HCL 25 MG PO TABS: 25.0000 mg | ORAL_TABLET | Freq: Four times a day (QID) | ORAL | Status: DC | PRN

## 2014-03-18 MED ORDER — ACETAMINOPHEN 325 MG PO TABS
650.0000 mg | ORAL_TABLET | Freq: Four times a day (QID) | ORAL | Status: DC | PRN
  Administered 2014-03-18 – 2014-03-20 (×4): 650 mg via ORAL
  Filled 2014-03-18 (×4): qty 2

## 2014-03-18 NOTE — BH Assessment (Signed)
Patient accepted to [facility] bed 300-2 to Dr. Sabra Heck. Clinician provided updates to Dr. Betsey Holiday.   Rigoberto Noel, MSW, LCSW Triage Specialist [phone]

## 2014-03-18 NOTE — Progress Notes (Signed)
Attended Group

## 2014-03-18 NOTE — ED Notes (Signed)
Pt accepted at [facility].  Report called.  Transport verified.

## 2014-03-18 NOTE — Progress Notes (Signed)
Initial Nursing Admit Note  Pt admit from [facility] for detox from alcohol. Pt states she drinks one pint and a half of [place] daily, drinking for 9 months. Pt states she has a young son and a daughter. Denies SI, HI, AVH. Cooperative with nursing assessment. Pt states "I was drinking alcohol to help me sleep". A: pt given support and reassurance. Orientated to unit. Therapeutic conversation with patient. R: pt adjusting to unit. Appropriate interaction with peers. No voiced concerns.

## 2014-03-18 NOTE — ED Notes (Addendum)
Patient reports that she has been drinking heavily x 9 months. Patient states she has been drinking daily and her last drink ws yesterday at 1300. Patient states since 0100 today she has been having chest pressure and anxiety. Patient denies any SI/HI, auditory or visual hallucinations. Patient states she drinks heavily at times and has experienced black outs.

## 2014-03-18 NOTE — Progress Notes (Signed)
Patient ID: Shannon Cervantes, female   DOB: September 30, 1970, 43 y.o.   MRN: [id number] AA female was seen in the ER seeking alcohol detox treatment.  Patient reports she has been drinking a pint to a pint and half of Liquor daily.  She drinks until she passes's out.  Patient states she past out drinking two weeks ago and fell without remembering she fell.  Her daughter came to visit and found her bleeding from her fore head.  Lately she has been having bruises all over her body with out knowing how she got the bruises.  Patient is aler and oriented x3.  She denies SI/HI/AVH, She has a hx of depression on medication.  She denies withdrawal seizures.  Her withdrawal symptoms includes anxiety, tremors, nausea and vomiting.  We have accepted her admission and have a signed bed for her.     Charmaine Downs   PMHNP-BC  I have personally seen the patient and agreed with the findings and involved in the treatment plan. Berniece Andreas, MD

## 2014-03-18 NOTE — ED Notes (Signed)
Security wanded patient and belongings bags x 2. Patient also had a blue tote bag. Patient's belongings included : cell phone, cell phone charger, wallet with $66.00 in it, headphones, earrings x 1 pair, papers, clothes, and tennis shoes. Offered for patient to lock wallet and money up with security and refused.

## 2014-03-18 NOTE — ED Notes (Signed)
Patient belongings were placed in locker 76. Patients belongings are in two patient belonging bags and one blue personal bag.

## 2014-03-18 NOTE — ED Provider Notes (Signed)
CSN: [id number]     Arrival date & time 03/18/14  0912 History   First MD Initiated Contact with Patient 03/18/14 0914     Chief Complaint  Patient presents with  . Chest Pain  . alcohol detox      (Consider location/radiation/quality/duration/timing/severity/associated sxs/prior Treatment) HPI Comments: Patient presents to the ER for evaluation of multiple problems. Patient reports that she has been up since 1:30 AM with inability to sleep. Patient has been feeling extremely anxious. She has had pressure in the center of her chest. Patient does report a history of depression, anxiety and panic attacks. She has not had panic attacks since she was in her 75s, however.  Patient does report that over the past year, she has been drinking heavily. It started to social drinking, but she started to drink heavily because of social stressors. Patient admits to drinking daily now. She does report withdrawal symptoms when she does not drink.  Patient admits to ongoing issues with depression. She is not actively homicidal or suicidal, but does express some thoughts about death and possibly passive death wish.   Past Medical History  Diagnosis Date  . Foot fracture 01-07-2011  . Hypertensive cardiomyopathy   . Depression   . Hyperlipidemia   . Anxiety   . Alcohol abuse    Past Surgical History  Procedure Laterality Date  . Cesarean section     Family History  Problem Relation Age of Onset  . Hypertension Mother   . Heart disease Mother   . Hyperlipidemia Mother   . Heart disease Father    History  Substance Use Topics  . Smoking status: Current Some Day Smoker -- 0.01 packs/day    Types: Cigarettes  . Smokeless tobacco: Never Used     Comment: 1 Cigarettes every other Day  . Alcohol Use: Yes     Comment: daily   OB History   Grav Para Term Preterm Abortions TAB SAB Ect Mult Living                 Review of Systems  Respiratory: Positive for shortness of breath.    Cardiovascular: Positive for chest pain and palpitations.  Psychiatric/Behavioral: Positive for dysphoric mood. The patient is nervous/anxious.   All other systems reviewed and are negative.     Allergies  Suboxone  Home Medications   Prior to Admission medications   Medication Sig Start Date End Date Taking? Authorizing Rion Schnitzer  buPROPion (WELLBUTRIN XL) 150 MG 24 hr tablet Take 150 mg by mouth daily.   Yes Historical Kember Boch, MD  estazolam (PROSOM) 2 MG tablet Take 2 mg by mouth at bedtime.   Yes Historical Brae Gartman, MD  ibuprofen (ADVIL,MOTRIN) 200 MG tablet Take 400 mg by mouth every 6 (six) hours as needed for headache or moderate pain.    Yes Historical Hennessy Bartel, MD  medroxyPROGESTERone (DEPO-PROVERA) 150 MG/ML injection Inject 150 mg into the muscle every 3 (three) months.   Yes Historical Brandilee Pies, MD   BP 150/99  Pulse 107  Temp(Src) 98.8 F (37.1 C) (Oral)  Resp 20  SpO2 93% Physical Exam  Constitutional: She is oriented to person, place, and time. She appears well-developed and well-nourished. No distress.  HENT:  Head: Normocephalic and atraumatic.  Right Ear: Hearing normal.  Left Ear: Hearing normal.  Nose: Nose normal.  Mouth/Throat: Oropharynx is clear and moist and mucous membranes are normal.  Eyes: Conjunctivae and EOM are normal. Pupils are equal, round, and reactive to light.  Neck: Normal  range of motion. Neck supple.  Cardiovascular: Regular rhythm, S1 normal and S2 normal.  Tachycardia present.  Exam reveals no gallop and no friction rub.   No murmur heard. Pulmonary/Chest: Effort normal and breath sounds normal. No respiratory distress. She exhibits no tenderness.  Abdominal: Soft. Normal appearance and bowel sounds are normal. There is no hepatosplenomegaly. There is no tenderness. There is no rebound, no guarding, no tenderness at McBurney's point and negative Murphy's sign. No hernia.  Musculoskeletal: Normal range of motion.  Neurological: She  is alert and oriented to person, place, and time. She has normal strength. No cranial nerve deficit or sensory deficit. Coordination normal. GCS eye subscore is 4. GCS verbal subscore is 5. GCS motor subscore is 6.  Skin: Skin is warm, dry and intact. No rash noted. No cyanosis.  Psychiatric: She has a normal mood and affect. Her speech is normal and behavior is normal. Thought content normal.    ED Course  Procedures (including critical care time) Labs Review Labs Reviewed  CBC WITH DIFFERENTIAL  COMPREHENSIVE METABOLIC PANEL  LIPASE, BLOOD  TROPONIN I  D-DIMER, QUANTITATIVE  URINE RAPID DRUG SCREEN (HOSP PERFORMED)  URINALYSIS, ROUTINE W REFLEX MICROSCOPIC  POC URINE PREG, ED    Imaging Review No results found.   EKG Interpretation   Date/Time:  Saturday March 18 2014 09:27:58 EDT Ventricular Rate:  91 PR Interval:  128 QRS Duration: 76 QT Interval:  367 QTC Calculation: 451 R Axis:   41 Text Interpretation:  Sinus rhythm Normal ECG Confirmed by POLLINA  MD,  CHRISTOPHER ([id number]) on 03/18/2014 9:36:41 AM      MDM   Final diagnoses:  None   alcohol abuse  Alcohol withdrawal  Anxiety and panic attack  Depression  She presented to the ER with multiple problems. Patient was here today because of chest pain which is likely secondary to panic attack. Patient does have a history of anxiety and panic disorder, as well as depression. Patient endorses ongoing issues with depression. She has history of polysubstance abuse as well.  Patient's cardiac workup was negative. EKG was normal. Troponin was negative. Patient did have a negative d-dimer.  She was endorsing withdrawal symptoms, has not had alcohol in nearly 24 hours. She does not have a history of seizures with her withdrawal symptoms, but has never stopped drinking.  Patient is medically clear after her workup. Will have behavioral health to evaluate her for depression and alcohol dependence.    Orpah Greek, MD 03/18/14 [phone]

## 2014-03-18 NOTE — ED Notes (Signed)
Pt belongings returned

## 2014-03-19 DIAGNOSIS — F4323 Adjustment disorder with mixed anxiety and depressed mood: Secondary | ICD-10-CM

## 2014-03-19 DIAGNOSIS — F411 Generalized anxiety disorder: Secondary | ICD-10-CM

## 2014-03-19 DIAGNOSIS — F332 Major depressive disorder, recurrent severe without psychotic features: Secondary | ICD-10-CM

## 2014-03-19 DIAGNOSIS — F1910 Other psychoactive substance abuse, uncomplicated: Secondary | ICD-10-CM

## 2014-03-19 MED ORDER — CHLORDIAZEPOXIDE HCL 25 MG PO CAPS
25.0000 mg | ORAL_CAPSULE | Freq: Four times a day (QID) | ORAL | Status: AC
  Administered 2014-03-19 – 2014-03-20 (×4): 25 mg via ORAL
  Filled 2014-03-19 (×4): qty 1

## 2014-03-19 MED ORDER — ONDANSETRON 4 MG PO TBDP: 4.0000 mg | ORAL_TABLET | Freq: Four times a day (QID) | ORAL | Status: DC | PRN

## 2014-03-19 MED ORDER — VITAMIN B-1 100 MG PO TABS
100.0000 mg | ORAL_TABLET | Freq: Every day | ORAL | Status: DC
  Administered 2014-03-20 – 2014-03-21 (×2): 100 mg via ORAL
  Filled 2014-03-19 (×4): qty 1

## 2014-03-19 MED ORDER — CHLORDIAZEPOXIDE HCL 25 MG PO CAPS
25.0000 mg | ORAL_CAPSULE | Freq: Four times a day (QID) | ORAL | Status: DC | PRN
  Administered 2014-03-19 (×2): 25 mg via ORAL
  Filled 2014-03-19 (×2): qty 1

## 2014-03-19 MED ORDER — ACAMPROSATE CALCIUM 333 MG PO TBEC
666.0000 mg | DELAYED_RELEASE_TABLET | Freq: Three times a day (TID) | ORAL | Status: DC
  Administered 2014-03-19 – 2014-03-21 (×7): 666 mg via ORAL
  Filled 2014-03-19: qty 84
  Filled 2014-03-19 (×2): qty 2
  Filled 2014-03-19 (×2): qty 84
  Filled 2014-03-19 (×2): qty 2
  Filled 2014-03-19 (×2): qty 84
  Filled 2014-03-19 (×2): qty 2
  Filled 2014-03-19: qty 84

## 2014-03-19 MED ORDER — VITAMIN B-1 100 MG PO TABS
100.0000 mg | ORAL_TABLET | Freq: Every day | ORAL | Status: DC
  Filled 2014-03-19: qty 1

## 2014-03-19 MED ORDER — ADULT MULTIVITAMIN W/MINERALS CH
1.0000 | ORAL_TABLET | Freq: Every day | ORAL | Status: DC
  Filled 2014-03-19 (×3): qty 1

## 2014-03-19 MED ORDER — THIAMINE HCL 100 MG/ML IJ SOLN: 100.0000 mg | Freq: Once | INTRAMUSCULAR | Status: DC

## 2014-03-19 MED ORDER — HYDROXYZINE HCL 25 MG PO TABS
25.0000 mg | ORAL_TABLET | Freq: Four times a day (QID) | ORAL | Status: DC | PRN
Start: 2014-03-19 — End: 2014-03-21
  Administered 2014-03-19 – 2014-03-21 (×5): 25 mg via ORAL
  Filled 2014-03-19 (×5): qty 1

## 2014-03-19 MED ORDER — CHLORDIAZEPOXIDE HCL 25 MG PO CAPS: 25.0000 mg | ORAL_CAPSULE | ORAL | Status: DC

## 2014-03-19 MED ORDER — HYDROXYZINE HCL 25 MG PO TABS: 25.0000 mg | ORAL_TABLET | Freq: Four times a day (QID) | ORAL | Status: DC | PRN

## 2014-03-19 MED ORDER — LOPERAMIDE HCL 2 MG PO CAPS: 2.0000 mg | ORAL_CAPSULE | ORAL | Status: DC | PRN

## 2014-03-19 MED ORDER — ONDANSETRON 4 MG PO TBDP
4.0000 mg | ORAL_TABLET | Freq: Four times a day (QID) | ORAL | Status: DC | PRN
  Administered 2014-03-19 – 2014-03-21 (×6): 4 mg via ORAL
  Filled 2014-03-19 (×6): qty 1

## 2014-03-19 MED ORDER — CHLORDIAZEPOXIDE HCL 25 MG PO CAPS: 25.0000 mg | ORAL_CAPSULE | Freq: Every day | ORAL | Status: DC

## 2014-03-19 MED ORDER — TEMAZEPAM 15 MG PO CAPS
30.0000 mg | ORAL_CAPSULE | Freq: Every day | ORAL | Status: DC
  Administered 2014-03-19 – 2014-03-20 (×2): 30 mg via ORAL
  Filled 2014-03-19 (×2): qty 2

## 2014-03-19 MED ORDER — ADULT MULTIVITAMIN W/MINERALS CH
1.0000 | ORAL_TABLET | Freq: Every day | ORAL | Status: DC
  Administered 2014-03-20 – 2014-03-21 (×2): 1 via ORAL
  Filled 2014-03-19 (×5): qty 1

## 2014-03-19 MED ORDER — CHLORDIAZEPOXIDE HCL 25 MG PO CAPS
25.0000 mg | ORAL_CAPSULE | Freq: Three times a day (TID) | ORAL | Status: AC
  Administered 2014-03-20 – 2014-03-21 (×3): 25 mg via ORAL
  Filled 2014-03-19 (×3): qty 1

## 2014-03-19 NOTE — Progress Notes (Signed)
Writer spoke with patient 1:1 and she c/o of experiencing some withdrawal symptoms. Patient received medication according to symptoms reported. She requested a sleep aid to be ordered and a call will be placed for this medication. Patient has been observed up in the dayroom interacting appropriately with peers earlier. She attended AA group this evening. Patient denies si/hi/a/v hallucinations. Support and encouragement given, safety maintained on unit with 15 min checks.
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
## 2014-03-19 NOTE — BHH Group Notes (Signed)
[facility] Group Notes:  (Nursing/MHT/Case Management/Adjunct)  Date:  03/19/2014  Time:  5:31 PM  Type of Therapy:  Psychoeducational Skills  Participation Level:  Active  Participation Quality:  Appropriate  Affect:  Appropriate  Cognitive:  Appropriate  Insight:  Appropriate  Engagement in Group:  Engaged  Modes of Intervention:  Discussion  Summary of Progress/Problems: Pt did attend self inventory group.  Shannon Cervantes Shanta 03/19/2014, 5:31 PM

## 2014-03-19 NOTE — BHH Group Notes (Signed)
[facility] Group Notes:  (Clinical Social Work)  03/19/2014  10:00-11:00AM  Summary of Progress/Problems:   The main focus of today's process group was to   1)  discuss the importance of adding supports  2)  define health supports versus unhealthy supports  3)  identify the patient's current unhealthy supports and plan how to handle them  4)  Identify the patient's current healthy supports and plan what to add.  An emphasis was placed on using counselor, doctor, therapy groups, 12-step groups, and problem-specific support groups to expand supports.    The patient expressed full comprehension of the concepts presented, and agreed that there is a need to add more supports.  The patient stated her current supports and a couple of friends and a few family members.  She stated everybody in her life has been "after" her to get help, and she had to hit her lowest point to do that, feels this is where she is now.  She plans to go to CD[facility]IOP at [facility].  Type of Therapy:  Process Group with Motivational Interviewing  Participation Level:  Active  Participation Quality:  Attentive and Sharing  Affect:  Blunted  Cognitive:  Alert and Oriented  Insight:  Engaged  Engagement in Therapy:  Engaged  Modes of Intervention:   Education, Support and Processing, Activity  [employer], LCSW 03/19/2014, 12:15pm

## 2014-03-19 NOTE — Progress Notes (Signed)
Patient ID: Shannon Cervantes, female   DOB: [birth year], 43 y.o.   MRN: [id number]   D: Pt has been flat and depressed on the unit, pt has also reported severe withdrawal symptoms. Pt has reported lots of anxiety, N/V, and pain. Pt was given several as needed medications to help with withdrawal symptoms.  Dr. Sabra Heck was also made aware of patient not being on a protocol, patient was started on the Librium protocol. Pt reported her depression as a 0, and her hopelessness as a 0. Pt reported being negative SI/HI, no AH/VH noted. A: 15 min checks continued for patient safety. R: Pt safety maintained.

## 2014-03-19 NOTE — BHH Group Notes (Signed)
[facility] Group Notes:  (Nursing/MHT/Case Management/Adjunct)  Date:  03/19/2014  Time:  5:31 PM  Type of Therapy:  Psychoeducational Skills  Participation Level:  Active  Participation Quality:  Appropriate  Affect:  Appropriate  Cognitive:  Appropriate  Insight:  Appropriate  Engagement in Group:  Engaged  Modes of Intervention:  Discussion  Summary of Progress/Problems: Pt did attend healthy support systems group. Shannon Cervantes 03/19/2014, 5:31 PM
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
## 2014-03-19 NOTE — Plan of Care (Signed)
Problem: Alteration in mood & ability to function due to Goal: STG-Pt will be introduced to the 12-step program of recovery (Patient will be introduced to the 12-step program of recovery and disease concept of addiction)  Outcome: Progressing Patient attended AA group this evening.

## 2014-03-19 NOTE — [facility] Suicide Risk Assessment (Signed)
Suicide Risk Assessment  Admission Assessment     Nursing information obtained from:    Demographic factors:    Current Mental Status:    Loss Factors:    Historical Factors:    Risk Reduction Factors:    Total Time spent with patient: 45 minutes  CLINICAL FACTORS:   Depression:   Comorbid alcohol abuse/dependence Alcohol/Substance Abuse/Dependencies  Psychiatric Specialty Exam:     Blood pressure 142/92, pulse 87, temperature 98 F (36.7 C), temperature source Oral, resp. rate 16, height 5\' 3"  (1.6 m), weight 81.647 kg (180 lb), SpO2 100.00%.Body mass index is 31.89 kg/(m^2).  General Appearance: Fairly Groomed  Engineer, water::  Fair  Speech:  Clear and Coherent  Volume:  Normal  Mood:  Anxious and Depressed  Affect:  anxious, worried  Thought Process:  Coherent and Goal Directed  Orientation:  Full (Time, Place, and Person)  Thought Content:  events symptoms worries concerns attempts to quit  Suicidal Thoughts:  No  Homicidal Thoughts:  No  Memory:  Immediate;   Fair Recent;   Fair Remote;   Fair  Judgement:  Fair  Insight:  Present  Psychomotor Activity:  Restlessness  Concentration:  Fair  Recall:  [employer] of Knowledge:NA  Language: Fair  Akathisia:  No  Handed:    AIMS (if indicated):     Assets:  Desire for Improvement Housing Transportation Vocational/Educational  Sleep:  Number of Hours: 5.5   Musculoskeletal: Strength & Muscle Tone: within normal limits Gait & Station: normal Patient leans: N/A  COGNITIVE FEATURES THAT CONTRIBUTE TO RISK:  Polarized thinking Thought constriction (tunnel vision)    SUICIDE RISK:   Moderate:  PLAN OF CARE: Supportive approach/coping skills/relapse prevention                               Pursue the detox                               Reassess and address the co morbidities  I certify that inpatient services furnished can reasonably be expected to improve the patient's condition.  Shannon Cervantes A 03/19/2014,  5:19 PM

## 2014-03-19 NOTE — Progress Notes (Signed)
Patient did attend the evening speaker AA meeting.  

## 2014-03-20 DIAGNOSIS — F1994 Other psychoactive substance use, unspecified with psychoactive substance-induced mood disorder: Secondary | ICD-10-CM

## 2014-03-20 MED ORDER — TEMAZEPAM 30 MG PO CAPS: 30.0000 mg | ORAL_CAPSULE | Freq: Every day | ORAL | Status: DC

## 2014-03-20 MED ORDER — ACAMPROSATE CALCIUM 333 MG PO TBEC: 666.0000 mg | DELAYED_RELEASE_TABLET | Freq: Three times a day (TID) | ORAL | Status: DC

## 2014-03-20 MED ORDER — HYDROXYZINE HCL 50 MG PO TABS
25.0000 mg | ORAL_TABLET | Freq: Three times a day (TID) | ORAL | Status: DC | PRN
  Filled 2014-03-20 (×2): qty 20

## 2014-03-20 MED ORDER — MEDROXYPROGESTERONE ACETATE 150 MG/ML IM SUSP: 150.0000 mg | INTRAMUSCULAR | Status: DC

## 2014-03-20 MED ORDER — INFLUENZA VAC SPLIT QUAD 0.5 ML IM SUSY
0.5000 mL | PREFILLED_SYRINGE | INTRAMUSCULAR | Status: AC
  Administered 2014-03-21: 0.5 mL via INTRAMUSCULAR
  Filled 2014-03-20: qty 0.5

## 2014-03-20 MED ORDER — HYDROXYZINE HCL 25 MG PO TABS: ORAL_TABLET | ORAL | Status: DC

## 2014-03-20 NOTE — Clinical Social Work Note (Signed)
CSW spoke with pt's supervisor, Shannon Cervantes per pt request, indicating that pt is in the hospital with d/c date that is not known at this time. Shannon Barrow provided fax number [phone] requesting letter with hospitalization dates at d/c. Pt consented to allowing this transmission of info.   [place], [place] 03/20/2014 10:53 AM

## 2014-03-20 NOTE — [facility] Group Notes (Signed)
[place] LCSW Group Therapy  03/20/2014 3:29 PM  Type of Therapy:  Group Therapy  Participation Level:  Did Not Attend-pt in room sleeping. Did not get up to come to group when called.   [place], Britten Parady  LCSWA   03/20/2014, 3:29 PM

## 2014-03-20 NOTE — Progress Notes (Signed)
[facility] MD Progress Note  03/20/2014 4:19 PM MESHAWN OCONNOR  MRN:  [id number] Subjective:  Erabella states she is feeling sluggish sedated. She sates she really wants to deal with her alcoholism. She is wanting to do a CD IOP as states she has to take care of her son. She admits to persistent cravings.. She was started on Campral and hopes this medication works for her. She Diagnosis:   DSM5: Substance/Addictive Disorders:  Alcohol Related Disorder - Severe (303.90) Depressive Disorders:  Major Depressive Disorder - Moderate (296.22) Total Time spent with patient: 30 minutes  Axis I: Substance Induced Mood Disorder  ADL's:  Intact  Sleep: Fair  Appetite:  Fair Psychiatric Specialty Exam: Physical Exam  Review of Systems  Constitutional: Positive for malaise/fatigue.  HENT: Negative.   Eyes: Negative.   Respiratory: Negative.   Cardiovascular: Negative.   Gastrointestinal: Negative.   Genitourinary: Negative.   Musculoskeletal: Negative.   Skin: Negative.   Neurological: Positive for weakness.  Endo/Heme/Allergies: Negative.   Psychiatric/Behavioral: Positive for substance abuse. The patient is nervous/anxious.     Blood pressure 127/78, pulse 92, temperature 98.7 F (37.1 C), temperature source Oral, resp. rate 16, height 5\' 3"  (1.6 m), weight 81.647 kg (180 lb), SpO2 100.00%.Body mass index is 31.89 kg/(m^2).  General Appearance: Fairly Groomed  Engineer, water::  Fair  Speech:  Clear and Coherent, Slow and not spontaneous  Volume:  Decreased  Mood:  Anxious and Depressed  Affect:  Restricted  Thought Process:  Coherent and Goal Directed  Orientation:  Full (Time, Place, and Person)  Thought Content:  symptoms events worries concerns  Suicidal Thoughts:  No  Homicidal Thoughts:  No  Memory:  Immediate;   Fair Recent;   Fair Remote;   Fair  Judgement:  Fair  Insight:  Present  Psychomotor Activity:  Restlessness  Concentration:  Fair  Recall:  [employer] of Knowledge:NA   Language: Fair  Akathisia:  No  Handed:    AIMS (if indicated):     Assets:  Desire for Improvement Housing Transportation Vocational/Educational  Sleep:  Number of Hours: 5.25   Musculoskeletal: Strength & Muscle Tone: within normal limits Gait & Station: normal Patient leans: N/A  Current Medications: Current Facility-Administered Medications  Medication Dose Route Frequency Provider Last Rate Last Dose  . acamprosate (CAMPRAL) tablet 666 mg  666 mg Oral TID WC Nicholaus Bloom, MD   666 mg at 03/20/14 1202  . acetaminophen (TYLENOL) tablet 650 mg  650 mg Oral Q6H PRN Delfin Gant, NP   650 mg at 03/20/14 1553  . alum & mag hydroxide-simeth (MAALOX/MYLANTA) 200-200-20 MG/5ML suspension 30 mL  30 mL Oral Q4H PRN Delfin Gant, NP      . chlordiazePOXIDE (LIBRIUM) capsule 25 mg  25 mg Oral Q6H PRN Knox Royalty, NP   25 mg at 03/19/14 1813  . chlordiazePOXIDE (LIBRIUM) capsule 25 mg  25 mg Oral TID Knox Royalty, NP   25 mg at 03/20/14 1202   Followed by  . [START ON 03/21/2014] chlordiazePOXIDE (LIBRIUM) capsule 25 mg  25 mg Oral BH-qamhs Knox Royalty, NP       Followed by  . [START ON 03/23/2014] chlordiazePOXIDE (LIBRIUM) capsule 25 mg  25 mg Oral Daily Knox Royalty, NP      . diphenhydrAMINE (BENADRYL) capsule 100 mg  100 mg Oral QHS PRN Dara Hoyer, PA-C   100 mg at 03/18/14 2245  . hydrOXYzine (ATARAX/VISTARIL) tablet 25  mg  25 mg Oral Q6H PRN Knox Royalty, NP   25 mg at 03/20/14 5449  . loperamide (IMODIUM) capsule 2-4 mg  2-4 mg Oral PRN Knox Royalty, NP      . magnesium hydroxide (MILK OF MAGNESIA) suspension 30 mL  30 mL Oral Daily PRN Delfin Gant, NP      . multivitamin with minerals tablet 1 tablet  1 tablet Oral Daily Knox Royalty, NP   1 tablet at 03/20/14 [phone]  . ondansetron (ZOFRAN-ODT) disintegrating tablet 4 mg  4 mg Oral Q6H PRN Knox Royalty, NP   4 mg at 03/20/14 1551  . temazepam (RESTORIL) capsule 30 mg  30 mg Oral QHS Knox Royalty, NP    30 mg at 03/19/14 2251  . thiamine (B-1) injection 100 mg  100 mg Intramuscular Once Knox Royalty, NP      . thiamine (B-1) injection 100 mg  100 mg Intramuscular Once Knox Royalty, NP      . thiamine (VITAMIN B-1) tablet 100 mg  100 mg Oral Daily Knox Royalty, NP   100 mg at 03/20/14 0712    Lab Results: No results found for this or any previous visit (from the past 48 hour(s)).  Physical Findings: AIMS: Facial and Oral Movements Muscles of Facial Expression: None, normal Lips and Perioral Area: None, normal Jaw: None, normal Tongue: None, normal,Extremity Movements Upper (arms, wrists, hands, fingers): None, normal Lower (legs, knees, ankles, toes): None, normal, Trunk Movements Neck, shoulders, hips: None, normal, Overall Severity Severity of abnormal movements (highest score from questions above): None, normal Incapacitation due to abnormal movements: None, normal Patient's awareness of abnormal movements (rate only patient's report): No Awareness, Dental Status Current problems with teeth and/or dentures?: No Does patient usually wear dentures?: No  CIWA:  CIWA-Ar Total: 2 COWS:     Treatment Plan Summary: Daily contact with patient to assess and evaluate symptoms and progress in treatment Medication management  Plan: Supportive approach/coping skills/relapse prevention           Detox/reassess and address the co morbidities  Medical Decision Making Problem Points:  Review of psycho-social stressors (1) Data Points:  Review of new medications or change in dosage (2)  I certify that inpatient services furnished can reasonably be expected to improve the patient's condition.   Kenry Daubert A 03/20/2014, 4:19 PM

## 2014-03-20 NOTE — Plan of Care (Signed)
Problem: Alteration in mood; excessive anxiety as evidenced by: Goal: STG-Pt will report an absence of self-harm thoughts/actions (Patient will report an absence of self-harm thoughts or actions)  Outcome: Completed/Met Date Met:  03/20/14 Patient denies thoughts to hurt her self and remains free from self harm.

## 2014-03-20 NOTE — Progress Notes (Signed)
Adult Psychoeducational Group Note  Date:  03/20/2014 Time:  8:15pm  Group Topic/Focus:  Wrap-Up Group:   The focus of this group is to help patients review their daily goal of treatment and discuss progress on daily workbooks.  Participation Level:  Active  Participation Quality:  Appropriate and Attentive  Affect:  Appropriate  Cognitive:  Alert and Appropriate  Insight: Appropriate  Engagement in Group:  Engaged  Modes of Intervention:  Discussion  Additional Comments:  Pt. Was attentive and appropriate during today's group discussion. Pt shared that she is finally catching up on her rest and that she is always busy and on the go. Pt stated that she has to start taking better care of herself and start putting herself first.   Truman Hayward 03/20/2014, 9:34 PM

## 2014-03-20 NOTE — BHH Group Notes (Signed)
[facility] LCSW Aftercare Discharge Planning Group Note   03/20/2014 9:59 AM  Participation Quality:  Appropriate   Mood/Affect:  Appropriate  Depression Rating:  0  Anxiety Rating:  2-3  Thoughts of Suicide:  No Will you contract for safety?   NA  Current AVH:  No  Plan for Discharge/Comments:  Pt hoping for d/c Tuesday. SHe plans to return to [facility] for med management. Pt reporting some withdrawals (mild anxiety) and reports good sleep but feels groggy due to medications. Pt requesting that CSW contact her boss to let them know she is in the hospital. Pt requesting letter as well. She was provided with CDIOP info per her request and is working with her HR to determine if she can get FMLA in order to complete CDIOP program. CSW assessing. Her adult daughter is watching her 42 yr old child until she d/cs.   Transportation Means: family   Supports: daughter   Rockney Ghee, Alicia Amel

## 2014-03-20 NOTE — Progress Notes (Signed)
Patient ID: Shannon Cervantes, female   DOB: Jul 20, 1970, 43 y.o.   MRN: [id number]  D: Pt. Denies SI/HI and A/V Hallucinations. Patient reports withdrawal symptoms including headache, lightheadedness, and nausea. Patient reported relief upon reassessment of Zofran and Tylenol. Patient rates her depression and hopelessness at 0/10 and her anxiety 5/10. Patient rates her sleep fair, energy level low, and concentration level good.  A: Support and encouragement provided to the patient to continue with treatment plan and come to writer with questions or concerns. Scheduled medications administered to patient per physician's orders.  R: Patient is receptive and cooperative with Probation officer. Patient can be seen in the milieu at times but did not go out during recreation time. Patient is attending some groups. Q15 minute checks are maintained for safety.

## 2014-03-20 NOTE — Progress Notes (Signed)
Writer has observed patient up in the dayroom interacting appropriately with select peers. She attended AA group this evening. She is compliant with her medications and minimal withdrawal symptoms this evening. Support and encouragement given, safety maintained on unit with 15 min checks.

## 2014-03-21 DIAGNOSIS — F331 Major depressive disorder, recurrent, moderate: Secondary | ICD-10-CM | POA: Diagnosis present

## 2014-03-21 DIAGNOSIS — F10939 Alcohol use, unspecified with withdrawal, unspecified: Secondary | ICD-10-CM

## 2014-03-21 DIAGNOSIS — F1020 Alcohol dependence, uncomplicated: Principal | ICD-10-CM

## 2014-03-21 DIAGNOSIS — F10239 Alcohol dependence with withdrawal, unspecified: Secondary | ICD-10-CM

## 2014-03-21 NOTE — Progress Notes (Signed)
Discharge note: Pt received discharge instructions per Randall Hiss, RN. Hawthorne Day, RN., discharged pt off the unit. Discharge ordered placed, per Dr. Sabra Heck. Pt denies SI/HI/AVH. No withdrawal noted.

## 2014-03-21 NOTE — Progress Notes (Signed)
Patient ID: Shannon Cervantes, female   DOB: 08-02-1970, 43 y.o.   MRN: [id number] She has been up and interacting withp eers and staff, Self inventory this AM: depression0, hopelessness 0, anxiety 2, indicated having withdrawal symptoms , has not requested any prn medications,.  She denies SI thoughts Goal is to go home and work with the case manager to go home.

## 2014-03-21 NOTE — Progress Notes (Signed)
[facility] Adult Case Management Discharge Plan :  Will you be returning to the same living situation after discharge: Yes,  home At discharge, do you have transportation home?:Yes,  family member Do you have the ability to pay for your medications:Yes,  Private insurance  Release of information consent forms completed and submitted to medical records by CSW.  Patient to Follow up at: Follow-up Information   Follow up with [facility]  On 04/19/2014. (Appt with Lattie Haw on this date at 4:00PM. They will call you if there are any cancellations during that time. )    Contact information:   ATTN: [address], [place] [address] Phone: [phone] Fax: [phone]      Follow up with CD IOP- [facility]. (Left message with Webb Silversmith ([place]) requesting orientation date. She will contact you directly. )    Contact information:   [address] [place], [place] [address] Phone: [phone] Fax: [phone]      Patient denies SI/HI:   Yes,  during self report/group    Safety Planning and Suicide Prevention discussed:  Yes,  Pt refused to consent to family contact. SPE completed with pt and she was encouraged to share information with support network, ask questions, and talk about any concerns relating to SPE  Smart, Meliyah Simon LCSWA  03/21/2014, 11:29 AM

## 2014-03-21 NOTE — Tx Team (Addendum)
Interdisciplinary Treatment Plan Update (Adult)   Date: 03/21/2014 Time Reviewed: 11:32 AM  Progress in Treatment:  Attending groups: yes  Participating in groups:  Yes  Taking medication as prescribed: Yes  Tolerating medication: Yes  Family/Significant othe contact made: No. Pt refused to consent to SPE. Patient understands diagnosis: Yes, AEB seeking treatment for  Discussing patient identified problems/goals with staff: Yes  Medical problems stabilized or resolved: Yes  Denies suicidal/homicidal ideation: Yes  Patient has not harmed self or Others: Yes  New problem(s) identified:  Discharge Plan or Barriers: Pt plans to go to [facility]/go to CDIOP. She plans to return home at d/c. She was given note for school. FMLA completed by MD and will be faxed today by  CSW.  Additional comments: Patient presented to [facility] for evaluation, encouraged by friend, for multiple problems including chest pain , increased anxiety And desire to "stop drinking". 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 She has seen [facility] in the past for med management and therapy, Eartha Inch NP and [facility] for counseling. She has tried many antidepressants (prozac, Paxil, Zoloft, Celexa) without success but admits she never took as prescribed. Awaits results from buccal testing for med recommendations.  She began drinking several year ago and it has escalated to daily use half-full pint "Henessey"/ cognac a day. Her last drink was Friday at 4pm. She has a brother who has been sober for 4 yrs. She tells me she drinks to numb her feelings of emptiness; her two daughters are grown and moved out of the house and although whe has a 62 yo son she recognizes "empty nest syndrome". 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 Every one at work thinks she is "OK" but  "im a walking disaster".  Reason for Continuation of Hospitalization: d/c today  Estimated length of stay: d/c today For review of initial/current patient goals, please see plan of care.  Attendees:  Patient:    Family:    Physician: Carlton Adam MD 03/21/2014 11:32 AM   Nursing: Sharl Ma RN 03/21/2014 11:32 AM   Clinical Social Worker [place], [place]  03/21/2014 11:32 AM   Other: Butch Penny RN 03/21/2014 11:32 AM   Other: Gerline Legacy Nurse CM 03/21/2014 11:32 AM   Other: Burnis Medin LCSW  03/21/2014 11:32 AM   Other:    Scribe for Treatment Team:  Maxie Better [place] 03/21/2014 11:32 AM   Pt and CSW reviewed pt's identified goals and treatment plan. Pt verbalized understanding and agreed to treatment plan.

## 2014-03-21 NOTE — Progress Notes (Signed)
Recreation Therapy Notes  Animal-Assisted Activity/Therapy (AAA/T) Program Checklist/Progress Notes Patient Eligibility Criteria Checklist & Daily Group note for Rec Tx Intervention  Date: 09.29.2015 Time: 2:45pm Location: [address]   AAA/T Program Assumption of Risk Form signed by Patient/ or Parent Legal Guardian yes  Patient is free of allergies or sever asthma yes  Patient reports no fear of animals yes  Patient reports no history of cruelty to animals yes   Patient understands his/her participation is voluntary yes  Patient washes hands before animal contact yes  Patient washes hands after animal contact yes  Behavioral Response: Appropriate   Education: Hand Washing, Appropriate Animal Interaction   Education Outcome: Acknowledges education.   Clinical Observations/Feedback: Patient interacted appropriately with therapy dog and peers during session. Patient shared her son would like a dog, asked many questions about owning a dog and about general dog obedience. Patient additionally asked LRT to explain how having a pet can help with depression and anxiety. LRT complied with patient request.   Lane Hacker, LRT/CTRS  Lane Hacker 03/21/2014 4:13 PM

## 2014-03-21 NOTE — BHH Suicide Risk Assessment (Signed)
Suicide Risk Assessment  Discharge Assessment     Demographic Factors:  Caucasian  Total Time spent with patient: 30 minutes  Psychiatric Specialty Exam:     Blood pressure 114/75, pulse 91, temperature 98.4 F (36.9 C), temperature source Oral, resp. rate 16, height 5\' 3"  (1.6 m), weight 81.647 kg (180 lb), SpO2 100.00%.Body mass index is 31.89 kg/(m^2).  General Appearance: Fairly Groomed  Engineer, water::  Fair  Speech:  Clear and Coherent  Volume:  Normal  Mood:  Euthymic  Affect:  Appropriate  Thought Process:  Coherent and Goal Directed  Orientation:  Full (Time, Place, and Person)  Thought Content:  plans as she moves on, relapse prevention plan  Suicidal Thoughts:  No  Homicidal Thoughts:  No  Memory:  Immediate;   Fair Recent;   Fair Remote;   Fair  Judgement:  Fair  Insight:  Present  Psychomotor Activity:  normal  Concentration:  Fair  Recall:  [employer] [place]: Fair  Akathisia:  No  Handed:    AIMS (if indicated):     Assets:  Desire for Improvement Housing Talents/Skills Transportation Vocational/Educational  Sleep:  Number of Hours: 5    Musculoskeletal: Strength & Muscle Tone: within normal limits Gait & Station: normal Patient leans: N/A   Mental Status Per Nursing Assessment::   On Admission:     Current Mental Status by Physician: In full contact with reality. There are no active S/S of withdrawal. No active SI plans or intent. Willing and motivated to pursue outpatient treatment   Loss Factors: NA  Historical Factors: NA  Risk Reduction Factors:   Responsible for children under 16 years of age, Sense of responsibility to family, Employed, Living with another person, especially a relative and Positive social support  Continued Clinical Symptoms:  Depression:   Comorbid alcohol abuse/dependence Alcohol/Substance Abuse/Dependencies  Cognitive Features That Contribute To Risk: None identified   Suicide Risk:   Minimal: No identifiable suicidal ideation.  Patients presenting with no risk factors but with morbid ruminations; may be classified as minimal risk based on the severity of the depressive symptoms  Discharge Diagnoses:   AXIS I:  Alcohol Dependence S/P alcohol withdrawal, Major Depression recurrent AXIS II:  No diagnosis AXIS III:   Past Medical History  Diagnosis Date  . Foot fracture 01-07-2011  . Hypertensive cardiomyopathy   . Depression   . Hyperlipidemia   . Anxiety   . Alcohol abuse    AXIS IV:  other psychosocial or environmental problems AXIS V:  61-70 mild symptoms  Plan Of Care/Follow-up recommendations:  Activity:  as tolerated Diet:  regular Follow up [facility] and [facility] Poulus Is patient on multiple antipsychotic therapies at discharge:  No   Has Patient had three or more failed trials of antipsychotic monotherapy by history:  No  Recommended Plan for Multiple Antipsychotic Therapies: NA    Tameaka Eichhorn A 03/21/2014, 10:57 AM

## 2014-03-21 NOTE — BHH Suicide Risk Assessment (Signed)
[facility] INPATIENT:  Family/Significant Other Suicide Prevention Education  Suicide Prevention Education:  Patient Refusal for Family/Significant Other Suicide Prevention Education: The patient Shannon Cervantes has refused to provide written consent for family/significant other to be provided Family/Significant Other Suicide Prevention Education during admission and/or prior to discharge.  Physician notified.  SPE completed with pt and she was encouraged to share information with support network, ask questions, and talk about any concerns relating to SPE.   Smart, Jnaya Butrick LCSWA 03/21/2014, 11:26 AM

## 2014-03-21 NOTE — BHH Group Notes (Signed)
[place] LCSW Group Therapy  03/21/2014 3:54 PM  Type of Therapy:  Group Therapy  Participation Level:  Active  Participation Quality:  Attentive  Affect:  Appropriate  Cognitive:  Alert and Oriented  Insight:  Engaged  Engagement in Therapy:  Engaged  Modes of Intervention:  Confrontation, Discussion, Education, Exploration, Limit-setting, Problem-solving, Rapport Building and Socialization and Support   Summary of Progress/Problems:  Feelings around Diagnosis-pt's were encouraged to explore their personal thoughts about their diagnosis and how this affects them. Jazlyne was attentive and engaged during today's processing group. She shared that she views her mental illness in the same way that she views medical illness. Asmara shows progress in the group setting and improving insight AEB her ability to process how taking even one drink of alcohol can cause her to spiral into full blown relapse. Caiden explored how a "simple bone fracture turned into pain pill addiction and eventually turned into alcoholism." Chrisha stated that she is not able to stop without help and is open to CDIOP and "all the help I can get."    Smart, Shreeya Recendiz Enlow  03/21/2014, 3:54 PM

## 2014-03-21 NOTE — Progress Notes (Signed)
Pt reports she is doing better this evening.  She is hopeful for discharge on Tuesday.  She denies SI/HI/AV at this time.  She denies any withdrawal symptoms except that she is having reoccurring nausea when she tries to eat.  She had been given zofran in the afternoon, and requested ginger ale this evening because she knew it was too soon for the zofran.  She states she is going to go home at discharge and do CP-IOP for her follow up treatment.  Pt voices no needs or concerns at this time other than wanting to know what time she will be discharged so she can make arrangements.  Pt was informed that she would need to speak with her case worker about those plans.  Pt pleasant/cooperative with staff.  Support and encouragement offered.  Pt is med compliant on the unit.  Safety maintained with q15 minute checks.

## 2014-03-21 NOTE — Plan of Care (Signed)
Problem: Alteration in mood Goal: STG-Pt Able to Identify Plan For Continuing Care at D/C Pt. Will be able to identify a plan for continuing care at discharge  Outcome: Completed/Met Date Met:  03/21/14 Pt reports she plans to do CD-IOP at discharge and will coordinate these plans with her CSW.

## 2014-03-22 NOTE — Discharge Summary (Signed)
Physician Discharge Summary Note  Patient:  Shannon Cervantes is an 43 y.o., female MRN:  [id number] DOB:  [birth year] Patient phone:  [phone] (home)  Patient address:   [place] [address],  Total Time spent with patient: 20 minutes  Date of Admission:  03/18/2014 Date of Discharge: 03/21/14  Reason for Admission:  Mood stabilization   Discharge Diagnoses: Active Problems:   Alcohol dependence   Adjustment disorder with mixed anxiety and depressed mood   Depression, major, recurrent, moderate  Psychiatric Specialty Exam: Physical Exam  Psychiatric: She has a normal mood and affect. Her speech is normal and behavior is normal. Judgment and thought content normal. Cognition and memory are normal.    Review of Systems  Constitutional: Negative.   HENT: Negative.   Eyes: Negative.   Respiratory: Negative.   Cardiovascular: Negative.   Gastrointestinal: Negative.   Genitourinary: Negative.   Musculoskeletal: Negative.   Skin: Negative.   Neurological: Negative.   Endo/Heme/Allergies: Negative.   Psychiatric/Behavioral: Positive for depression (Stable ) and substance abuse (Stable in recovery ).    Blood pressure 124/86, pulse 99, temperature 98.4 F (36.9 C), temperature source Oral, resp. rate 16, height 5\' 3"  (1.6 m), weight 81.647 kg (180 lb), SpO2 100.00%.Body mass index is 31.89 kg/(m^2).  See Physician SRA                                                  Past Psychiatric History: See H&P Diagnosis:  Hospitalizations:  Outpatient Care:  Substance Abuse Care:  Self-Mutilation:  Suicidal Attempts:  Violent Behaviors:   Musculoskeletal: Strength & Muscle Tone: within normal limits Gait & Station: normal Patient leans: N/A  DSM5:  AXIS I: Alcohol Dependence S/P alcohol withdrawal, Major Depression recurrent  AXIS II: No diagnosis  AXIS III:  Past Medical History   Diagnosis  Date   .  Foot fracture  01-07-2011    .  Hypertensive cardiomyopathy    .  Depression    .  Hyperlipidemia    .  Anxiety    .  Alcohol abuse     AXIS IV: other psychosocial or environmental problems  AXIS V: 61-70 mild symptoms  Level of Care:  OP  Hospital Course:  Shannon Cervantes presented to [facility] for evaluation, encouraged by friend, for multiple problems including chest pain , increased anxiety And desire to "stop drinking". 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 She has seen [facility] in the past for med management and therapy, Eartha Inch NP and [facility] for counseling. She has tried many antidepressants (prozac, Paxil, Zoloft, Celexa) without success but admits she never took as prescribed. Awaits results from buccal testing for med recommendations.  She began drinking several year ago and it has escalated to daily use half-full pint "Henessey"/ cognac a day. Her last drink was Friday at 4pm. She has a brother who has been sober for 4 yrs. She tells me she drinks to numb her feelings of emptiness; her two daughters are grown and moved out of the house and although whe has a 48 yo son she recognizes "empty nest syndrome". 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 Every one at work thinks she  is "OK" but "im a walking disaster". She "wants to stop drinking" and is interested in IP rehabilitation, her main concern is finding care for her 50 yo son. She rates her depression at 0/10 and Anxiety 5/10 She denies craving and has had nausea relived by prn meds.         Shannon Cervantes was admitted to the adult 300 unit where she was evaluated and her symptoms were identified. Medication management was discussed and implemented. The patient was detoxified from alcohol using the librium detox protocol. She was started on Campral 666 mg TID to treat her alcohol dependence.  She  was encouraged to participate in unit programming. Medical problems were identified and treated appropriately. Home medication was restarted as needed.  She was evaluated each day by a clinical provider to ascertain the patient's response to treatment.  Improvement was noted by the patient's report of decreasing symptoms, improved sleep and appetite, affect, medication tolerance, behavior, and participation in unit programming.  The patient was asked each day to complete a self inventory noting mood, mental status, pain, new symptoms, anxiety and concerns.         She responded well to medication and being in a therapeutic and supportive environment. Patient admitted to persistent cravings for alcohol but wanted help to overcome her addiction. She showed insight during group by stating that even one drink of alcohol can cause a complete relapse. Positive and appropriate behavior was noted and the patient was motivated for recovery.  She worked closely with the treatment team and case manager to develop a discharge plan with appropriate goals. Coping skills, problem solving as well as relaxation therapies were also part of the unit programming.         By the day of discharge she was in much improved condition than upon admission.  Symptoms were reported as significantly decreased or resolved completely. The patient denied SI/HI and voiced no AVH. She was motivated to continue taking medication with a goal of continued improvement in mental health.   Shannon Cervantes was discharged home with a plan to follow up as noted below. Patient left [facility] with all belongings returned in no acute distress.   Consults:  None  Significant Diagnostic Studies:  Chemistry panel, CBC, UA, UDS positive for benzos  Discharge Vitals:   Blood pressure 124/86, pulse 99, temperature 98.4 F (36.9 C), temperature source Oral, resp. rate 16, height 5\' 3"  (1.6 m), weight 81.647 kg (180 lb), SpO2 100.00%. Body mass index is 31.89  kg/(m^2). Lab Results:   No results found for this or any previous visit (from the past 72 hour(s)).  Physical Findings: AIMS: Facial and Oral Movements Muscles of Facial Expression: None, normal Lips and Perioral Area: None, normal Jaw: None, normal Tongue: None, normal,Extremity Movements Upper (arms, wrists, hands, fingers): None, normal Lower (legs, knees, ankles, toes): None, normal, Trunk Movements Neck, shoulders, hips: None, normal, Overall Severity Severity of abnormal movements (highest score from questions above): None, normal Incapacitation due to abnormal movements: None, normal Patient's awareness of abnormal movements (rate only patient's report): No Awareness, Dental Status Current problems with teeth and/or dentures?: No Does patient usually wear dentures?: No  CIWA:  CIWA-Ar Total: 2 COWS:     Psychiatric Specialty Exam: See Psychiatric Specialty Exam and Suicide Risk Assessment completed by Attending Physician prior to discharge.  Discharge destination:  Home  Is patient on multiple antipsychotic therapies at discharge:  No   Has Patient had three or more  failed trials of antipsychotic monotherapy by history:  No  Recommended Plan for Multiple Antipsychotic Therapies: NA     Medication List    STOP taking these medications       buPROPion 150 MG 24 hr tablet  Commonly known as:  WELLBUTRIN XL     estazolam 2 MG tablet  Commonly known as:  PROSOM     ibuprofen 200 MG tablet  Commonly known as:  ADVIL,MOTRIN      TAKE these medications     Indication   acamprosate 333 MG tablet  Commonly known as:  CAMPRAL  Take 2 tablets (666 mg total) by mouth 3 (three) times daily with meals. For alcohol dependence   Indication:  Excessive Use of Alcohol     hydrOXYzine 25 MG tablet  Commonly known as:  ATARAX/VISTARIL  Take 1 tablet (25 mg) three times daily as needed for anxiety   Indication:  Tension, Anxiety     medroxyPROGESTERone 150 MG/ML  injection  Commonly known as:  DEPO-PROVERA  Inject 1 mL (150 mg total) into the muscle every 3 (three) months. For birth control   Indication:  Birth control method     temazepam 30 MG capsule  Commonly known as:  RESTORIL  Take 1 capsule (30 mg total) by mouth at bedtime. For sleep   Indication:  Trouble Sleeping           Follow-up Information   Follow up with [facility]  On 04/19/2014. (Appt with Lattie Haw on this date at 4:00PM. They will call you if there are any cancellations during that time. )    Contact information:   ATTN: [address] [place], [place] [address] Phone: [phone] Fax: [phone]      Follow up with CD [facility]. (Left message with Webb Silversmith ([place]) requesting orientation date. She will contact you directly. )    Contact information:   [address] [place], [place] [address] Phone: [phone] Fax: [phone]      Follow-up recommendations:   Activity: as tolerated  Diet: regular  Follow up [facility] and [facility]oulus  Comments:   Take all your medications as prescribed by your mental healthcare provider.  Report any adverse effects and or reactions from your medicines to your outpatient provider promptly.  Patient is instructed and cautioned to not engage in alcohol and or illegal drug use while on prescription medicines.  In the event of worsening symptoms, patient is instructed to call the crisis hotline, 911 and or go to the nearest ED for appropriate evaluation and treatment of symptoms.  Follow-up with your primary care provider for your other medical issues, concerns and or health care needs.   Total Discharge Time:  Greater than 30 minutes.  Signed: Elmarie Shiley NP-C 03/22/2014, 1:30 PM I personally assessed the patient and formulated the plan Geralyn Flash A. Sabra Heck, M.D.

## 2014-03-24 ENCOUNTER — Encounter (HOSPITAL_COMMUNITY): Payer: Self-pay | Admitting: Psychology

## 2014-03-24 NOTE — Progress Notes (Signed)
Patient Discharge Instructions:  After Visit Summary (AVS):   Faxed to:  03/24/14 Discharge Summary Note:   Faxed to:  03/24/14 Psychiatric Admission Assessment Note:   Faxed to:  03/24/14 Suicide Risk Assessment - Discharge Assessment:   Faxed to:  03/24/14 Faxed/Sent to the Next Level Care provider:  03/24/14 Next Level Care Provider Has Access to the EMR, 03/24/14 Faxed to [facility] @ [phone] Records provided to [facility] via [facility]/Epic access.  Patsey Berthold, 03/24/2014, 10:13 AM

## 2014-03-27 ENCOUNTER — Other Ambulatory Visit (HOSPITAL_COMMUNITY): Payer: 59 | Attending: Psychiatry | Admitting: Psychology

## 2014-03-27 DIAGNOSIS — F1020 Alcohol dependence, uncomplicated: Secondary | ICD-10-CM | POA: Diagnosis present

## 2014-03-27 DIAGNOSIS — F329 Major depressive disorder, single episode, unspecified: Secondary | ICD-10-CM | POA: Insufficient documentation

## 2014-03-27 DIAGNOSIS — F10239 Alcohol dependence with withdrawal, unspecified: Secondary | ICD-10-CM

## 2014-03-28 ENCOUNTER — Encounter (HOSPITAL_COMMUNITY): Payer: Self-pay | Admitting: Psychology

## 2014-03-28 NOTE — Progress Notes (Signed)
    Daily Group Progress Note  Program: CD-IOP   Group Time: 1-2:30 pm  Participation Level: Active  Behavioral Response: Sharing  Type of Therapy: Process Group  Topic: After checking in, group members shared about what went on over the weekend, meetings they went to, and any significant issues they were having.  There was one new group member today who had the chance to introduce herself and share what has led her to this program.  The group then shifted into psychoeducation, where group members completed the "Wheel of Life" exercise.  This exercise assesses their level of satisfaction in eight life domains and shows the level of balance or imbalance represented in one's life.  Group Time: 2:45- 4pm  Participation Level: Active  Behavioral Response: Sharing  Type of Therapy: Psycho-education Group  Topic:  The second part of group was spent processing the "Wheel of Life" exercise.  Group members then drew their own responses on the board, discussed their ratings, and processed what they can do to improve satisfaction in certain areas of their lives.  Group members provided feedback to each other and shared ideas about how to improve satisfaction in the different life domains.  Summary:Today was the patient's first group session, and she had the chance to introduce herself to the group and share what has led her to this program.  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  She stated that her usual routine on the weekends is to buy liquor and drink all weekend, but this was the first weekend in a long time that she spent quality time with her 73 year old son.  The patient has four children, however, three of them are adults.  She expressed concern about her young son and what his memories of her drinking will be.  She started using drugs when she was in her early  44's and has a family history of substance abuse.  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  The patient stated that she has isolated herself and that she is not fulfilled in the area of friend and family.  The patient's sobriety date is 9/26.    Family Program: Family present? No   Name of family member(s):   UDS collected: No Results:   AA/NA attended?: No  Sponsor?: No   Mary-Anne Polizzi, LCAS

## 2014-03-29 ENCOUNTER — Other Ambulatory Visit (HOSPITAL_COMMUNITY): Payer: 59 | Admitting: Psychology

## 2014-03-29 DIAGNOSIS — F10239 Alcohol dependence with withdrawal, unspecified: Secondary | ICD-10-CM

## 2014-03-29 DIAGNOSIS — F1020 Alcohol dependence, uncomplicated: Secondary | ICD-10-CM | POA: Diagnosis not present

## 2014-03-30 ENCOUNTER — Encounter (HOSPITAL_COMMUNITY): Payer: Self-pay | Admitting: Psychology

## 2014-03-30 DIAGNOSIS — F10239 Alcohol dependence with withdrawal, unspecified: Secondary | ICD-10-CM | POA: Insufficient documentation

## 2014-03-30 NOTE — Progress Notes (Unsigned)
Shannon Cervantes is a 43 y.o. female patient ***.        Shemaiah Round, LCAS

## 2014-03-30 NOTE — Progress Notes (Unsigned)
Shannon Cervantes is a 43 y.o. female patient ***. Orientation for CD-IOP:  The patient is a 43 year old single, African American, female referred to the program after completing an impatient alcohol detox at [facility]. She lives with her 84 yo son and adult daughter in [place]. The patient reported a history of opiate use, beginning when she broke her foot in 2012. She was prescribed pain meds, but her use progressed to 15-16 pills per day. She was able to secure the drugs from a friend and this continued for almost 2 years. The patient reported she stopped using in 2014 and withdrew from the pain pills herself.  However in January of 2015, at age 36, she began drinking 2 shots of Hennessey per day to sleep. Her use increased and eventually progressed to 2 pints a day. She tried to control her drinking because it began to affect her job due to hangovers and irritability. She found herself binge drinking on the weekends. On 03/17/14 she was admitted to [facility] for detox and was discharged 4 days later. She has attended a few [facility] meetings in the past, but has isolated herself due to her drinking and is very socially withdrawn. Addiction is prominent in her family where two of her brothers are in active addiction and one has 4 years of recovery. Her paternal uncle is also an alcoholic. She has a Social worker and psychiatrist at [facility] from 2009 to present. The patient has been diagnosed with hypertension and cardiomyopathy. 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 She is currently on intermittent FMLA so that she can attend the CD-IOP group. The patient reports she has problems with her supervisors and coworkers but admits that some of the problems may be due to her drinking. The patient denied SI/HI. The patient displayed motivation to make changes in her life. She expressed the desires to be a better mother and a better employee. Papers were reviewed, signed and the orientation  completed accordingly. The patient will return on Monday and begin the CD-IOP. Her sobriety date is 9/25.       [name], LCAS

## 2014-03-30 NOTE — Progress Notes (Signed)
    Daily Group Progress Note  Program: CD-IOP   Group Time: 1-1:45 pm  Participation Level: Active  Behavioral Response: Appropriate and Sharing  Type of Therapy: Process Group  Topic: The first part of group was spent in process. Members checked-in with sobriety dates and followed up with issues and concerns in early recovery. One member admitted he had relapsed and the relapse was discussed at length. Two new members were present and they shared briefly about themselves. The new members shared openly about the reasons for being in the group and they received good feedback and support from their new fellow group members. Both admitted that they were very hesitant and anxious about this first group experience, but reported that they felt much more relaxed after hearing others share about their experiences.   Group Time: 2-4 pm  Participation Level: Active  Behavioral Response: Appropriate and Sharing  Type of Therapy: Psycho-education Group  Topic: Guest Speaker: the remainder of the session was spent with a guest visitor. This man talked about his life and his chemical use that began at a young age. He talked about having been confronted by family and friends and the growing negative consequences of his drug use. The visitor invited questions and group members asked about certain feelings or experiences and he was very receptive and offered good feedback. The session was inspiring and offered possibilities sand hope that some may not have had previously. Drug tests were collected this afternoon.    Summary: The patient reported she had attended an [place] meeting on Monday evening and it was a really powerful meeting. She had felt very supported and cared for. At the same time, the patient admitted that she was very tempted to drink last night when she came home yesterday after work. The group provided good feedback and offered suggestions about what she might do instead of drinking. The  patient also shared that she had blackouts and made phone calls when she was drunk like the other new member was disclosing. She also admitted that she had been involuntarily committed by family like the other woman. This woman shared about her guilt and concerns about her 38 yo son and the impact her drinking might have had. During the visit from the guest speaker, the patient had numerous questions for him. The patient spoke openly about her addiction and the problems she has had. She received good feedback and responded well to this intervention. Her sobriety date is 9/27.   Family Program: Family present? No   Name of family member(s):   UDS collected: Yes Results: not back from lab yet  AA/NA attended?: [facility]  Sponsor?: No, but she is new to the program.   Maliyah Willets, LCAS

## 2014-03-31 ENCOUNTER — Other Ambulatory Visit (HOSPITAL_COMMUNITY): Payer: 59

## 2014-03-31 ENCOUNTER — Encounter (HOSPITAL_COMMUNITY): Payer: Self-pay | Admitting: Licensed Clinical Social Worker

## 2014-03-31 ENCOUNTER — Telehealth (HOSPITAL_COMMUNITY): Payer: Self-pay | Admitting: Licensed Clinical Social Worker

## 2014-03-31 ENCOUNTER — Encounter (HOSPITAL_COMMUNITY): Payer: Self-pay

## 2014-03-31 DIAGNOSIS — F1120 Opioid dependence, uncomplicated: Secondary | ICD-10-CM | POA: Insufficient documentation

## 2014-03-31 NOTE — Progress Notes (Signed)
Shannon Cervantes is a 43 y.o. female patient CD-IOP:  Staffing for patient with PA Darlyne Russian, medical director. Discussed the patient's need for prescription for Camprel and her failure to meet for group today. Since the patient did not come to group today he was unable to write a prescription for Camprell since he was unable to meet her. The patient had mentioned after group on Wednesday that she was supposed to go on a bus trip with [employer] and it was discussed with her that this might be a high risk situation for her and it might not be a good idea for her to go. She had some guilt about not going because the hotel room was booked on double occupancy and her friend would end up paying for the whole room herself. We discussed that there would be a lot of drinking on this weekend trip. She said she would contact me on Friday 10/9 to let me know if she was going to come to group or go on the trip. She did not contact this counselor today by phone as she said she would       Dow Blahnik S, Licensed Cli

## 2014-03-31 NOTE — Progress Notes (Unsigned)
Patient ID: Shannon Cervantes, female   DOB: [birth year], 43 y.o.   MRN: [id number] No show

## 2014-04-01 ENCOUNTER — Encounter (HOSPITAL_COMMUNITY): Payer: Self-pay

## 2014-04-03 ENCOUNTER — Other Ambulatory Visit (HOSPITAL_COMMUNITY): Payer: 59 | Admitting: Psychology

## 2014-04-03 DIAGNOSIS — F1020 Alcohol dependence, uncomplicated: Secondary | ICD-10-CM | POA: Diagnosis not present

## 2014-04-03 DIAGNOSIS — F10239 Alcohol dependence with withdrawal, unspecified: Secondary | ICD-10-CM

## 2014-04-04 ENCOUNTER — Encounter (HOSPITAL_COMMUNITY): Payer: Self-pay | Admitting: Psychology

## 2014-04-04 NOTE — Progress Notes (Signed)
    Daily Group Progress Note  Program: CD-IOP   Group Time: 1-2:30 pm  Participation Level: Active  Behavioral Response: Sharing, Rationalizing, Resistant and Minimizing  Type of Therapy: Process Group  Topic: After checking in, group members shared what the weekend was like for them, as well as any problems that were encountered.  Two group members shared that they had relapsed and the group processed what had happened, challenging each other when rationalization was evident.  There was one new group member who was able to introduce herself and share what has led her to the program.  Group Time: 2:45-4pm  Participation Level: Active  Behavioral Response: Sharing  Type of Therapy: Psycho-education Group  Topic: - The second half of group was spent discussing the issues of Core Beliefs and Self-Esteem.  The group completed a free write activity in which they wrote about themselves, and then shared what they had written with a partner.  Discussion exploring what the experience was like for group members followed.  The group watched a PowerPoint, while discussing their self esteem, core beliefs, and early life experiences that contributed to the development of those core beliefs.  Summary: The patient reported that she had relapsed over the weekend.  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  The patient stated that she did homework on the bus to keep from drinking, but neglected to tell anyone about her recovery efforts.  She felt pressured to drink by others on the trip because she was the only sober one.  The patient went because she was lonely.  She is still uncomfortable about sharing her situation with others.  She reported drinking Saturday night, and all the way back on the bus on Sunday.  She stated that she had her Campral but it did not keep her from drinking.  The patient's sobriety date is now 10/12.    Family Program:  Family present? No   Name of family member(s):   UDS collected: Yes Results: results not back, but patient reported drinking on Sunday  AA/NA attended?: No  Sponsor?: No   Cathey Fredenburg, LCAS

## 2014-04-05 ENCOUNTER — Encounter (HOSPITAL_COMMUNITY): Payer: Self-pay | Admitting: Licensed Clinical Social Worker

## 2014-04-05 ENCOUNTER — Other Ambulatory Visit (HOSPITAL_COMMUNITY): Payer: 59 | Admitting: Psychology

## 2014-04-05 DIAGNOSIS — F1121 Opioid dependence, in remission: Secondary | ICD-10-CM

## 2014-04-05 DIAGNOSIS — F10239 Alcohol dependence with withdrawal, unspecified: Secondary | ICD-10-CM

## 2014-04-05 DIAGNOSIS — F1020 Alcohol dependence, uncomplicated: Secondary | ICD-10-CM | POA: Diagnosis not present

## 2014-04-06 ENCOUNTER — Encounter (HOSPITAL_COMMUNITY): Payer: Self-pay | Admitting: Psychology

## 2014-04-06 NOTE — Progress Notes (Signed)
    Daily Group Progress Note  Program: CD-IOP   Group Time: 1-2:30 pm  Participation Level: Active  Behavioral Response: Sharing  Type of Therapy: Process Group  Topic: Guest: the first part of group was spent with a guest speaker. The guest was a Clinical biochemist in the [place] system. He shared a little about his past and then discussed the topic of core beliefs with the group. He had been informed previously that the current topic had been negative core beliefs. There was good response among group members and they enjoyed the visit by this man who did not fit their vision of a chaplain. The session was engaging and invited even more questions.   Group Time: 2:45- 4pm  Participation Level: Active  Behavioral Response: Sharing  Type of Therapy: Psycho-education Group  Topic: Psycho-Ed: Core Beliefs: How they are formed and perpetuated. The second half of group was spent in a psycho-ed. The session included a slide show and a continuation of the session on Monday when the topic of "Core Beliefs" was first introduced. When asked for someone's core belief, a member offered hers. The belief was diagrammed on the board and the process and perpetuation of these distorted beliefs was described. There was good disclosure and feedback among group members.   Summary: The patient arrived late to group, but she had phoned ahead to inform her counselor of her late arrival. She engaged actively in the discussion with the chaplain. She also had been raised in a religion that taught her drinking and drugging was a sin. I questioned this since she has shared that addiction is rampant in her family. The patient dmitted she was embarrassed and ashamed to be an alcoholic and admitted that many of her family and friends wonder if its her choice. She pointed out, though, that the love and support she experienced on Monday evening at the [facility] was incredible and very empowering. She didn't feel so alone. The  patient recognizes she has core beliefs that must be addressed if she is to make the changes she needs to make. The patient responded well to this intervention. Her sobriety date is 10/12.    Family Program: Family present? No   Name of family member(s):   UDS collected: No Results:   AA/NA attended?: Yes Monday evening  Sponsor?: No, but she is new to the program and will have to attend more meetings before she secures a temporary sponsor   Neri Vieyra, LCAS

## 2014-04-06 NOTE — Progress Notes (Unsigned)
Shannon Cervantes is a 43 y.o. female patient ***. CD-IOP: Pt was scheduled to meet with me for an individual session and treatment planning at the conclusion today of the group session. She reported she couldn't stay for our scheduled weekly session due to her son having a haircut appt. I questioned the scheduling of the haircut appt and the importance of her treatment here at [facility]. I encouraged her to review her priorities of treatment. The pt was 45 minutes late for group today, when I inquired her tardiness, she reported she had to work over due to her work requirements. I will see the pt on Friday in group and remind her of the strict requirements around [facility] and work hours.       [name], Licensed Cli

## 2014-04-07 ENCOUNTER — Other Ambulatory Visit (HOSPITAL_COMMUNITY): Payer: 59

## 2014-04-07 MED ORDER — ACAMPROSATE CALCIUM 333 MG PO TBEC: 666.0000 mg | DELAYED_RELEASE_TABLET | Freq: Three times a day (TID) | ORAL | Status: DC

## 2014-04-07 NOTE — Progress Notes (Deleted)
Psychiatric Assessment Adult  Patient Identification:  Shannon Cervantes Date of Evaluation:  04/07/2014 Chief Complaint: *** History of Chief Complaint:  No chief complaint on file.   HPI Review of Systems Physical Exam  Depressive Symptoms: {DEPRESSION SYMPTOMS:20000}  (Hypo) Manic Symptoms:   Elevated Mood:  {BHH YES OR NO:22294} Irritable Mood:  {BHH YES OR NO:22294} Grandiosity:  {BHH YES OR NO:22294} Distractibility:  {BHH YES OR NO:22294} Labiality of Mood:  {BHH YES OR NO:22294} Delusions:  {BHH YES OR NO:22294} Hallucinations:  {BHH YES OR NO:22294} Impulsivity:  {BHH YES OR NO:22294} Sexually Inappropriate Behavior:  {BHH YES OR NO:22294} Financial Extravagance:  {BHH YES OR NO:22294} Flight of Ideas:  {BHH YES OR NO:22294}  Anxiety Symptoms: Excessive Worry:  {BHH YES OR NO:22294} Panic Symptoms:  {BHH YES OR NO:22294} Agoraphobia:  {BHH YES OR NO:22294} Obsessive Compulsive: {BHH YES OR NO:22294}  Symptoms: {Obsessive Compulsive Symptoms:22671} Specific Phobias:  {BHH YES OR NO:22294} Social Anxiety:  {BHH YES OR NO:22294}  Psychotic Symptoms:  Hallucinations: {BHH YES OR NO:22294} {Hallucinations:22672} Delusions:  {BHH YES OR NO:22294} Paranoia:  {BHH YES OR NO:22294}   Ideas of Reference:  {BHH YES OR NO:22294}  PTSD Symptoms: Ever had a traumatic exposure:  {BHH YES OR NO:22294} Had a traumatic exposure in the last month:  {BHH YES OR NO:22294} Re-experiencing: {BHH YES OR NO:22294} {Re-experiencing:22673} Hypervigilance:  {BHH YES OR NO:22294} Hyperarousal: {BHH YES OR NO:22294} {Hyperarousal:22674} Avoidance: {BHH YES OR NO:22294} {Avoidance:22675}  Traumatic Brain Injury: {BHH YES OR NO:22294} {Traumatic Brain Injury:22676}  Past Psychiatric History: Diagnosis: ***  Hospitalizations: ***  Outpatient Care: ***  Substance Abuse Care: ***  Self-Mutilation: ***  Suicidal Attempts: ***  Violent Behaviors: ***   Past Medical History:   Past  Medical History  Diagnosis Date  . Foot fracture 01-07-2011  . Hypertensive cardiomyopathy   . Depression   . Hyperlipidemia   . Anxiety   . Alcohol abuse    History of Loss of Consciousness:  {BHH YES OR NO:22294} Seizure History:  {BHH YES OR NO:22294} Cardiac History:  {BHH YES OR NO:22294} Allergies:   Allergies  Allergen Reactions  . Suboxone [Buprenorphine Hcl-Naloxone Hcl] Shortness Of Breath    dizziness    Current Medications:  Current Outpatient Prescriptions  Medication Sig Dispense Refill  . acamprosate (CAMPRAL) 333 MG tablet Take 2 tablets (666 mg total) by mouth 3 (three) times daily with meals. For alcohol dependence  180 tablet  0  . hydrOXYzine (ATARAX/VISTARIL) 25 MG tablet Take 1 tablet (25 mg) three times daily as needed for anxiety  45 tablet  0  . medroxyPROGESTERone (DEPO-PROVERA) 150 MG/ML injection Inject 1 mL (150 mg total) into the muscle every 3 (three) months. For birth control  1 mL    . temazepam (RESTORIL) 30 MG capsule Take 1 capsule (30 mg total) by mouth at bedtime. For sleep  10 capsule  0   No current facility-administered medications for this visit.    Previous Psychotropic Medications:  Medication Dose   ***  ***                     Substance Abuse History in the last 12 months: Substance Age of 1st Use Last Use Amount Specific Type  Nicotine  ***  ***  ***  ***  Alcohol  ***  ***  ***  ***  Cannabis  ***  ***  ***  ***  Opiates  ***  ***  ***  ***  Cocaine  ***  ***  ***  ***  Methamphetamines  ***  ***  ***  ***  LSD  ***  ***  ***  ***  Ecstasy  ***   ***  ***  ***  Benzodiazepines  ***  ***  ***  ***  Caffeine  ***  ***  ***  ***  Inhalants  ***  ***  ***  ***  Others:                          Medical Consequences of Substance Abuse: ***  Legal Consequences of Substance Abuse: ***  Family Consequences of Substance Abuse: ***  Blackouts:  {BHH YES OR NO:22294} DT's:  {BHH YES OR NO:22294} Withdrawal Symptoms:   {BHH YES OR NO:22294} {Withdrawal Symptoms:22677}  Social History: Current Place of Residence: *** Place of Birth: *** Family Members: *** Marital Status:  {Marital Status:22678} Children: ***  Sons: ***  Daughters: *** Relationships: *** Education:  {Education:22679} Educational Problems/Performance: *** Religious Beliefs/Practices: *** History of Abuse: {Desc; abuse:16542} Occupational Experiences; Military History:  {Military History:22680} Legal History: *** Hobbies/Interests: ***  Family History:   Family History  Problem Relation Age of Onset  . Hypertension Mother   . Heart disease Mother   . Hyperlipidemia Mother   . Heart disease Father   . Alcohol abuse Brother   . Alcohol abuse Paternal Uncle     Mental Status Examination/Evaluation: Objective:  Appearance: {Appearance:22683}  Eye Contact::  {BHH EYE CONTACT:22684}  Speech:  {Speech:22685}  Volume:  {Volume (PAA):22686}  Mood:  ***  Affect:  {Affect (PAA):22687}  Thought Process:  {Thought Process (PAA):22688}  Orientation:  {BHH ORIENTATION (PAA):22689}  Thought Content:  {Thought Content:22690}  Suicidal Thoughts:  {ST/HT (PAA):22692}  Homicidal Thoughts:  {ST/HT (PAA):22692}  Judgement:  {Judgement (PAA):22694}  Insight:  {Insight (PAA):22695}  Psychomotor Activity:  {Psychomotor (PAA):22696}  Akathisia:  {BHH YES OR NO:22294}  Handed:  {Handed:22697}  AIMS (if indicated):  ***  Assets:  {Assets (PAA):22698}    Laboratory/X-Ray Psychological Evaluation(s)   ***  ***   Assessment:  {axis diagnosis:3049000}  AXIS I {psych axis 1:31909}  AXIS II {psych axis 2:31910}  AXIS III Past Medical History  Diagnosis Date  . Foot fracture 01-07-2011  . Hypertensive cardiomyopathy   . Depression   . Hyperlipidemia   . Anxiety   . Alcohol abuse      AXIS IV {psych axis iv:31915}  AXIS V {psych axis v score:31919}   Treatment Plan/Recommendations:  Plan of Care: ***  Laboratory:   {Laboratory:22682}  Psychotherapy: ***  Medications: ***  Routine PRN Medications:  {BHH YES OR NO:22294}  Consultations: ***  Safety Concerns:  ***  Other:      [facility] Chem 10/16/20151:21 PM

## 2014-04-07 NOTE — Progress Notes (Unsigned)
Patient ID: Shannon Cervantes, female   DOB: March 02, 1971, 43 y.o.   MRN: [id number] Pt scheduled for CD IOP intake Assessment.Did not come to group.Assessment cancelled

## 2014-04-08 ENCOUNTER — Encounter (HOSPITAL_COMMUNITY): Payer: Self-pay

## 2014-04-10 ENCOUNTER — Encounter (HOSPITAL_COMMUNITY): Payer: Self-pay | Admitting: Psychology

## 2014-04-10 ENCOUNTER — Other Ambulatory Visit (HOSPITAL_COMMUNITY): Payer: 59 | Admitting: Psychology

## 2014-04-10 DIAGNOSIS — F1121 Opioid dependence, in remission: Secondary | ICD-10-CM

## 2014-04-10 DIAGNOSIS — F1020 Alcohol dependence, uncomplicated: Secondary | ICD-10-CM | POA: Diagnosis not present

## 2014-04-10 DIAGNOSIS — F10239 Alcohol dependence with withdrawal, unspecified: Secondary | ICD-10-CM

## 2014-04-11 ENCOUNTER — Encounter (HOSPITAL_COMMUNITY): Payer: Self-pay | Admitting: Psychology

## 2014-04-11 NOTE — Progress Notes (Signed)
Daily Group Progress Note  Program: CD-IOP   Group Time: 1-2:30 pm  Participation Level: Active  Behavioral Response: Sharing, Rationalizing, Resistant, Evasive and Minimizing  Type of Therapy: Process Group  Topic: Process: the first half of group was spent in process. Members shared about their weekend and any issues or concerns that may have challenged their sobriety. One member disclosed that she had relapsed over the weekend. She shared about the struggles of early recovery. Another complained about PAWS and wondered when they would go away? Members provided good feedback and support and the session was very engaging and informative.  Drug tests were collected during group today.   Group Time: 2:45- 4pm  Participation Level: Active  Behavioral Response: Sharing and Rationalizing  Type of Therapy: Psycho-education Group  Topic: Psycho-Ed: Guilt versus Shame: what is the difference? The second half of group was spent in a psycho-ed on Guilt vs Shame. A slide show was presented and members engaged in a discussion as the slide show included questions. Members were provided a handout and asked to list things they feel guilty about and those they feel ashamed of? The session proved very enlightening to most members, many of whom admitted they hadn't realized there was a difference between the two. Members were very revealing and open about identifying their shame issues and there was a depth of disclosure not previously witnessed.   Summary: the patient arrived late for group today. As the discussion continued, she was asked to check-in. The patient reported a sobriety date of 10/19, today. When asked to explain what had happened over the weekend, the patient recounted that Friday was a 'bad day'.she had left work around 2 pm, but instead of driving to group, she reported driving to the [facility] store where she bought alcohol. She began drinking and continued to drink through the weekend.  The patient became teary as she recounted passing out for about 6 hours on Saturday and finding her 42 yo son had made cereal during that time. She was teary and shared how she had finally phoned another group member here in the program and spoken with her late yesterday. The patient admitted it had been a huge relief to speak with someone about her struggles. She discussed her guilt over her son and her drinking. When a member suggested she consider inpatient, the patient reported that she has "a lot of stuff going on financially" and suggested she couldn't afford to go into treatment. Another member reminded her she wouldn't have a job if she didn't change something. The patient seemed to have plenty of excuses about why she couldn't do anything, but it seemed quite clear to others present, that she needed to do something. I pointed out at least half of her fellow group members have been in at least one inpatient treatment setting. All of them admitted it had been just what they needed. The patient reported she feels as if she can't stop and blamed it all on the weekend before last, when she went on the trip for the [employer] football game. She had begun drinking then and now had the taste and the desire. The patient shared openly about her problems and admitted it felt good and a big relief to talk about these things. Her sobriety date is today and we will discuss with her options that may need to be taken at this time. The patient has missed 2 group sessions that are unexcused, been late to 2 group sessions, missed  an individual session with her individual therappist and been absent for 2 consecutive sessions with the program director. She has also failed to attend any AA meetings in the last week.This cannot continue.    Family Program: Family present? No   Name of family member(s):   UDS collected: Yes Results: patient disclosed a relapse on alcohol over the weekend  AA/NA attended?: No  Sponsor?:  No   Ewing Fandino, LCAS

## 2014-04-11 NOTE — Progress Notes (Unsigned)
Shannon Cervantes is a 43 y.o. female patient ***. CD-IOP: I met with this patient after group today. She reported in group that she drank all weekend and passed out for 5 hours Saturday. On Friday she had an argument at work as well as an argument with a friend and then drove to the [facility] store. She missed group on Friday 10/16 and didn't call. She has missed 2 appointments with our Program Director Harold Hedge, has relapsed twice and has not gone to any meetings within the last week. She has missed 2 group sessions and was 45 minutes late to a 3rd group session.  I talked with her about her inability to stay sober in IOP and the need for a higher level of care. She has a 63 yo son and is concerned about who would take care of her son if she went into residential treatment. I also discussed her FMLA with her and pointed out that she must follow the FMLA guidelines or there could be repercussions with her job. The patient and I discussed options of residential placement. The patient will begin a conversation with her family members to discuss the care of her son if the patient follows our recommendation to go into residential treatment.  The patient will follow up with a phone call tomorrow (10/20) as to the progress of her discussion with her family. We will continue to follow the patient closely. Her sobriety date is 10/19. (Note by Binnie Rail, LPC-A, LCAS-A)       Katsumi Wisler, LCAS

## 2014-04-12 ENCOUNTER — Other Ambulatory Visit (HOSPITAL_COMMUNITY): Payer: 59 | Admitting: Psychology

## 2014-04-12 DIAGNOSIS — F10239 Alcohol dependence with withdrawal, unspecified: Secondary | ICD-10-CM

## 2014-04-12 DIAGNOSIS — F1121 Opioid dependence, in remission: Secondary | ICD-10-CM

## 2014-04-12 DIAGNOSIS — F1020 Alcohol dependence, uncomplicated: Secondary | ICD-10-CM | POA: Diagnosis not present

## 2014-04-13 ENCOUNTER — Encounter (HOSPITAL_COMMUNITY): Payer: Self-pay | Admitting: Psychology

## 2014-04-13 NOTE — Progress Notes (Signed)
    Daily Group Progress Note  Program: CD-IOP   Group Time: 1-2:30 pm  Participation Level: Active  Behavioral Response: Sharing, Suspicious and angry  Type of Therapy: Process Group  Topic: After checking in, group members each shared what has been going on in their lives since our last group meeting, and any recovery based activities they did.  The group members were all willing to share.  Drug tests were returned to group members today as well. One member arrived late for group and reported she had been contacted by DSS and they were investigating a complaint filed by someone in this group. This disclosure led to a lengthy discussion among group members.   Group Time: 2:45- 4pm  Participation Level: Active  Behavioral Response: Sharing  Type of Therapy: Psycho-education Group  Topic:Psychoeducation:  During the second part of group, the counselor led a discussion about how addiction affects the brain.  A slide show accompanied this presentation. The limbic system is the part of the brain where addiction occurs. Pet scans of an addict's brain were shown, including how it appears 10 days and 100 days after last use.  These slides helped members understand the lack of dopamine and accompanying discomfort and lethargy in early recovery. The concept of the brain's "plasticity" (or ability to repair itself) was emphasized.  Group members discussed their ideas and questions related to the material presented and were active and engaged in the session.  One group member read his "Step One" packet to the group and was given good feedback following his reading.   Summary: The patient arrived late due to a meeting and reported that she had received a call from [place] regarding the night she was passed out and unable to care for her son.  The report of possible child neglect was said to have come from someone in the group and the patient expressed her feelings of anger and  betrayal.  She stated that the report was a "wake up call", but wishes that she had been talked to before the call was made.  The group processed this issue and gave the patient feedback about how she could use this situation as a motivator to succeed in recovery.  she asked some good questions during the slide show in the second half of group and admitted it helped her make more sense of her cravings since her first relapse since getting out of detox. The patient's sobriety date is 10/19.   Family Program: Family present? No   Name of family member(s):   UDS collected: No Results:   AA/NA attended?: No  Sponsor?: No   Ellanore Vanhook, LCAS

## 2014-04-14 ENCOUNTER — Other Ambulatory Visit (HOSPITAL_COMMUNITY): Payer: 59

## 2014-04-17 ENCOUNTER — Other Ambulatory Visit (HOSPITAL_COMMUNITY): Payer: 59

## 2014-04-19 ENCOUNTER — Other Ambulatory Visit (HOSPITAL_COMMUNITY): Payer: 59

## 2014-04-21 ENCOUNTER — Other Ambulatory Visit (HOSPITAL_COMMUNITY): Payer: 59

## 2014-04-24 ENCOUNTER — Other Ambulatory Visit (HOSPITAL_COMMUNITY): Payer: 59

## 2014-04-26 ENCOUNTER — Other Ambulatory Visit (HOSPITAL_COMMUNITY): Payer: 59

## 2014-04-28 ENCOUNTER — Other Ambulatory Visit (HOSPITAL_COMMUNITY): Payer: 59

## 2014-04-29 ENCOUNTER — Encounter (HOSPITAL_COMMUNITY): Payer: Self-pay

## 2014-04-29 NOTE — Progress Notes (Unsigned)
  [place] Discharge Summary   Shannon Cervantes [id number]  Date of Admission: 04/03/2014 Date of Discharge: 04/14/2013  Course of Treatment: Pt failed to attend groups as scheduled and was recommended to Higher level of care  Goals and Activities to Help Maintain Sobriety: 1. Stay away from old friends who continue to drink and use mind-altering chemicals. 2. Continue practicing Fair Fighting rules in interpersonal conflicts. 3. Continue alcohol and drug refusal skills and call on support systems. 4. IP Treatment  Referrals: Treatment facility of choice   Aftercare services: na 1. Attend AA/NA meetings natimes per week. 2. Obtain a sponsor and a home group in [place]. 3. Return to Psychotherapist: PRN  Next appointment: NA  Plan of Action to Address Continuing Problems: IP therapy as noted     Client has NOT participated in the development of this discharge plan and has received a copy of this completed plan  Dara Hoyer  04/29/2014   [facility] CHEM 04/29/2014

## 2014-05-01 ENCOUNTER — Other Ambulatory Visit (HOSPITAL_COMMUNITY): Payer: 59

## 2014-05-03 ENCOUNTER — Other Ambulatory Visit (HOSPITAL_COMMUNITY): Payer: 59

## 2014-05-05 ENCOUNTER — Other Ambulatory Visit (HOSPITAL_COMMUNITY): Payer: 59

## 2014-05-08 ENCOUNTER — Other Ambulatory Visit (HOSPITAL_COMMUNITY): Payer: 59

## 2014-05-10 ENCOUNTER — Other Ambulatory Visit (HOSPITAL_COMMUNITY): Payer: 59

## 2014-05-10 ENCOUNTER — Encounter: Payer: Self-pay | Admitting: Psychiatry

## 2014-05-12 ENCOUNTER — Other Ambulatory Visit (HOSPITAL_COMMUNITY): Payer: 59

## 2014-05-15 ENCOUNTER — Other Ambulatory Visit (HOSPITAL_COMMUNITY): Payer: 59

## 2014-05-17 ENCOUNTER — Other Ambulatory Visit (HOSPITAL_COMMUNITY): Payer: 59

## 2014-05-19 ENCOUNTER — Other Ambulatory Visit (HOSPITAL_COMMUNITY): Payer: 59

## 2014-05-22 ENCOUNTER — Other Ambulatory Visit (HOSPITAL_COMMUNITY): Payer: 59

## 2014-05-24 ENCOUNTER — Other Ambulatory Visit (HOSPITAL_COMMUNITY): Payer: 59

## 2014-05-26 ENCOUNTER — Other Ambulatory Visit (HOSPITAL_COMMUNITY): Payer: 59

## 2014-05-29 ENCOUNTER — Other Ambulatory Visit (HOSPITAL_COMMUNITY): Payer: 59

## 2014-05-31 ENCOUNTER — Other Ambulatory Visit (HOSPITAL_COMMUNITY): Payer: 59

## 2014-06-02 ENCOUNTER — Other Ambulatory Visit (HOSPITAL_COMMUNITY): Payer: 59

## 2014-06-05 ENCOUNTER — Other Ambulatory Visit (HOSPITAL_COMMUNITY): Payer: 59

## 2014-06-07 ENCOUNTER — Other Ambulatory Visit (HOSPITAL_COMMUNITY): Payer: 59

## 2014-06-09 ENCOUNTER — Other Ambulatory Visit (HOSPITAL_COMMUNITY): Payer: 59

## 2014-06-12 ENCOUNTER — Other Ambulatory Visit (HOSPITAL_COMMUNITY): Payer: 59

## 2014-06-14 ENCOUNTER — Other Ambulatory Visit (HOSPITAL_COMMUNITY): Payer: 59

## 2014-08-24 ENCOUNTER — Encounter (HOSPITAL_COMMUNITY): Payer: Self-pay | Admitting: Emergency Medicine

## 2014-08-24 ENCOUNTER — Emergency Department (HOSPITAL_COMMUNITY): Payer: 59

## 2014-08-24 ENCOUNTER — Emergency Department (HOSPITAL_COMMUNITY)
Admission: EM | Admit: 2014-08-24 | Discharge: 2014-08-24 | Disposition: A | Discharge status: Home / Self Care | Payer: 59 | Attending: Emergency Medicine | Admitting: Emergency Medicine

## 2014-08-24 DIAGNOSIS — Z3202 Encounter for pregnancy test, result negative: Secondary | ICD-10-CM | POA: Insufficient documentation

## 2014-08-24 DIAGNOSIS — F329 Major depressive disorder, single episode, unspecified: Secondary | ICD-10-CM | POA: Insufficient documentation

## 2014-08-24 DIAGNOSIS — R110 Nausea: Secondary | ICD-10-CM | POA: Diagnosis not present

## 2014-08-24 DIAGNOSIS — G4700 Insomnia, unspecified: Secondary | ICD-10-CM

## 2014-08-24 DIAGNOSIS — Z79899 Other long term (current) drug therapy: Secondary | ICD-10-CM | POA: Insufficient documentation

## 2014-08-24 DIAGNOSIS — Z8781 Personal history of (healed) traumatic fracture: Secondary | ICD-10-CM | POA: Diagnosis not present

## 2014-08-24 DIAGNOSIS — K219 Gastro-esophageal reflux disease without esophagitis: Secondary | ICD-10-CM | POA: Diagnosis not present

## 2014-08-24 DIAGNOSIS — Z720 Tobacco use: Secondary | ICD-10-CM | POA: Diagnosis not present

## 2014-08-24 DIAGNOSIS — I119 Hypertensive heart disease without heart failure: Secondary | ICD-10-CM | POA: Insufficient documentation

## 2014-08-24 DIAGNOSIS — R079 Chest pain, unspecified: Secondary | ICD-10-CM

## 2014-08-24 DIAGNOSIS — E785 Hyperlipidemia, unspecified: Secondary | ICD-10-CM | POA: Insufficient documentation

## 2014-08-24 DIAGNOSIS — F419 Anxiety disorder, unspecified: Secondary | ICD-10-CM

## 2014-08-24 LAB — CBC WITH DIFFERENTIAL/PLATELET
Basophils Absolute: 0.1 10*3/uL (ref 0.0–0.1)
Basophils Relative: 1 % (ref 0–1)
EOS PCT: 7 % — AB (ref 0–5)
Eosinophils Absolute: 0.4 10*3/uL (ref 0.0–0.7)
HEMATOCRIT: 35.2 % — AB (ref 36.0–46.0)
HEMOGLOBIN: 12 g/dL (ref 12.0–15.0)
LYMPHS ABS: 1.7 10*3/uL (ref 0.7–4.0)
Lymphocytes Relative: 28 % (ref 12–46)
MCH: 31.3 pg (ref 26.0–34.0)
MCHC: 34.1 g/dL (ref 30.0–36.0)
MCV: 91.7 fL (ref 78.0–100.0)
MONO ABS: 0.4 10*3/uL (ref 0.1–1.0)
MONOS PCT: 7 % (ref 3–12)
NEUTROS ABS: 3.5 10*3/uL (ref 1.7–7.7)
Neutrophils Relative %: 57 % (ref 43–77)
Platelets: 297 10*3/uL (ref 150–400)
RBC: 3.84 MIL/uL — AB (ref 3.87–5.11)
RDW: 14.3 % (ref 11.5–15.5)
WBC: 6.1 10*3/uL (ref 4.0–10.5)

## 2014-08-24 LAB — URINALYSIS, ROUTINE W REFLEX MICROSCOPIC
BILIRUBIN URINE: NEGATIVE
GLUCOSE, UA: NEGATIVE mg/dL
Ketones, ur: NEGATIVE mg/dL
Leukocytes, UA: NEGATIVE
Nitrite: NEGATIVE
Protein, ur: NEGATIVE mg/dL
Specific Gravity, Urine: 1.028 (ref 1.005–1.030)
Urobilinogen, UA: 1 mg/dL (ref 0.0–1.0)
pH: 6.5 (ref 5.0–8.0)

## 2014-08-24 LAB — COMPREHENSIVE METABOLIC PANEL
ALBUMIN: 4 g/dL (ref 3.5–5.2)
ALK PHOS: 76 U/L (ref 39–117)
ALT: 20 U/L (ref 0–35)
ANION GAP: 5 (ref 5–15)
AST: 25 U/L (ref 0–37)
BUN: 14 mg/dL (ref 6–23)
CO2: 22 mmol/L (ref 19–32)
CREATININE: 0.44 mg/dL — AB (ref 0.50–1.10)
Calcium: 8.3 mg/dL — ABNORMAL LOW (ref 8.4–10.5)
Chloride: 112 mmol/L (ref 96–112)
GFR calc non Af Amer: >90 mL/min (ref >90)
GLUCOSE: 91 mg/dL (ref 70–99)
Potassium: 3.7 mmol/L (ref 3.5–5.1)
Sodium: 139 mmol/L (ref 135–145)
TOTAL PROTEIN: 7.2 g/dL (ref 6.0–8.3)
Total Bilirubin: 0.5 mg/dL (ref 0.3–1.2)

## 2014-08-24 LAB — I-STAT TROPONIN, ED: TROPONIN I, POC: 0 ng/mL (ref 0.00–0.08)

## 2014-08-24 LAB — POC URINE PREG, ED: PREG TEST UR: NEGATIVE

## 2014-08-24 LAB — URINE MICROSCOPIC-ADD ON

## 2014-08-24 LAB — BRAIN NATRIURETIC PEPTIDE: B NATRIURETIC PEPTIDE 5: 27.2 pg/mL (ref 0.0–100.0)

## 2014-08-24 LAB — D-DIMER, QUANTITATIVE: D-Dimer, Quant: 0.47 ug/mL-FEU (ref 0.00–0.48)

## 2014-08-24 LAB — LIPASE, BLOOD: Lipase: 23 U/L (ref 11–59)

## 2014-08-24 MED ORDER — GI COCKTAIL ~~LOC~~
30.0000 mL | Freq: Once | ORAL | Status: AC
  Administered 2014-08-24: 30 mL via ORAL
  Filled 2014-08-24: qty 30

## 2014-08-24 MED ORDER — ONDANSETRON HCL 4 MG/2ML IJ SOLN
4.0000 mg | Freq: Once | INTRAMUSCULAR | Status: AC
  Administered 2014-08-24: 4 mg via INTRAVENOUS
  Filled 2014-08-24: qty 2

## 2014-08-24 MED ORDER — PANTOPRAZOLE SODIUM 40 MG PO TBEC: 40.0000 mg | DELAYED_RELEASE_TABLET | Freq: Every day | ORAL | Status: DC

## 2014-08-24 MED ORDER — ONDANSETRON 4 MG PO TBDP: 4.0000 mg | ORAL_TABLET | Freq: Three times a day (TID) | ORAL | Status: DC | PRN

## 2014-08-24 MED ORDER — SODIUM CHLORIDE 0.9 % IV BOLUS (SEPSIS)
1000.0000 mL | Freq: Once | INTRAVENOUS | Status: AC
  Administered 2014-08-24: 1000 mL via INTRAVENOUS

## 2014-08-24 MED ORDER — TEMAZEPAM 30 MG PO CAPS: 30.0000 mg | ORAL_CAPSULE | Freq: Every evening | ORAL | Status: DC | PRN

## 2014-08-24 MED ORDER — PANTOPRAZOLE SODIUM 40 MG PO TBEC
40.0000 mg | DELAYED_RELEASE_TABLET | Freq: Once | ORAL | Status: AC
  Administered 2014-08-24: 40 mg via ORAL
  Filled 2014-08-24: qty 1

## 2014-08-24 NOTE — ED Notes (Signed)
Patient transported to X-ray 

## 2014-08-24 NOTE — Discharge Instructions (Signed)
Food Choices for Gastroesophageal Reflux Disease When you have gastroesophageal reflux disease (GERD), the foods you eat and your eating habits are very important. Choosing the right foods can help ease the discomfort of GERD. WHAT GENERAL GUIDELINES DO I NEED TO FOLLOW?  Choose fruits, vegetables, whole grains, low-fat dairy products, and low-fat meat, fish, and poultry.  Limit fats such as oils, salad dressings, butter, nuts, and avocado.  Keep a food diary to identify foods that cause symptoms.  Avoid foods that cause reflux. These may be different for different people.  Eat frequent small meals instead of three large meals each day.  Eat your meals slowly, in a relaxed setting.  Limit fried foods.  Cook foods using methods other than frying.  Avoid drinking alcohol.  Avoid drinking large amounts of liquids with your meals.  Avoid bending over or lying down until 2-3 hours after eating. WHAT FOODS ARE NOT RECOMMENDED? The following are some foods and drinks that may worsen your symptoms: Vegetables Tomatoes. Tomato juice. Tomato and spaghetti sauce. Chili peppers. Onion and garlic. Horseradish. Fruits Oranges, grapefruit, and lemon (fruit and juice). Meats High-fat meats, fish, and poultry. This includes hot dogs, ribs, ham, sausage, salami, and bacon. Dairy Whole milk and chocolate milk. Sour cream. Cream. Butter. Ice cream. Cream cheese.  Beverages Coffee and tea, with or without caffeine. Carbonated beverages or energy drinks. Condiments Hot sauce. Barbecue sauce.  Sweets/Desserts Chocolate and cocoa. Donuts. Peppermint and spearmint. Fats and Oils High-fat foods, including [place] fries and potato chips. Other Vinegar. Strong spices, such as black pepper, white pepper, red pepper, cayenne, curry powder, cloves, ginger, and chili powder. The items listed above may not be a complete list of foods and beverages to avoid. Contact your dietitian for more  information. Document Released: 06/09/2005 Document Revised: 06/14/2013 Document Reviewed: 04/13/2013 [facility] Patient Information 2015 [place], [place]. This information is not intended to replace advice given to you by your health care provider. Make sure you discuss any questions you have with your health care provider.  Gastroesophageal Reflux Disease, Adult Gastroesophageal reflux disease (GERD) happens when acid from your stomach flows up into the esophagus. When acid comes in contact with the esophagus, the acid causes soreness (inflammation) in the esophagus. Over time, GERD may create small holes (ulcers) in the lining of the esophagus. CAUSES   Increased body weight. This puts pressure on the stomach, making acid rise from the stomach into the esophagus.  Smoking. This increases acid production in the stomach.  Drinking alcohol. This causes decreased pressure in the lower esophageal sphincter (valve or ring of muscle between the esophagus and stomach), allowing acid from the stomach into the esophagus.  Late evening meals and a full stomach. This increases pressure and acid production in the stomach.  A malformed lower esophageal sphincter. Sometimes, no cause is found. SYMPTOMS   Burning pain in the lower part of the mid-chest behind the breastbone and in the mid-stomach area. This may occur twice a week or more often.  Trouble swallowing.  Sore throat.  Dry cough.  Asthma-like symptoms including chest tightness, shortness of breath, or wheezing. DIAGNOSIS  Your caregiver may be able to diagnose GERD based on your symptoms. In some cases, X-rays and other tests may be done to check for complications or to check the condition of your stomach and esophagus. TREATMENT  Your caregiver may recommend over-the-counter or prescription medicines to help decrease acid production. Ask your caregiver before starting or adding any new medicines.  HOME  CARE INSTRUCTIONS   Change the  factors that you can control. Ask your caregiver for guidance concerning weight loss, quitting smoking, and alcohol consumption.  Avoid foods and drinks that make your symptoms worse, such as:  Caffeine or alcoholic drinks.  Chocolate.  Peppermint or mint flavorings.  Garlic and onions.  Spicy foods.  Citrus fruits, such as oranges, lemons, or limes.  Tomato-based foods such as sauce, chili, salsa, and pizza.  Fried and fatty foods.  Avoid lying down for the 3 hours prior to your bedtime or prior to taking a nap.  Eat small, frequent meals instead of large meals.  Wear loose-fitting clothing. Do not wear anything tight around your waist that causes pressure on your stomach.  Raise the head of your bed 6 to 8 inches with wood blocks to help you sleep. Extra pillows will not help.  Only take over-the-counter or prescription medicines for pain, discomfort, or fever as directed by your caregiver.  Do not take aspirin, ibuprofen, or other nonsteroidal anti-inflammatory drugs (NSAIDs). SEEK IMMEDIATE MEDICAL CARE IF:   You have pain in your arms, neck, jaw, teeth, or back.  Your pain increases or changes in intensity or duration.  You develop nausea, vomiting, or sweating (diaphoresis).  You develop shortness of breath, or you faint.  Your vomit is green, yellow, black, or looks like coffee grounds or blood.  Your stool is red, bloody, or black. These symptoms could be signs of other problems, such as heart disease, gastric bleeding, or esophageal bleeding. MAKE SURE YOU:   Understand these instructions.  Will watch your condition.  Will get help right away if you are not doing well or get worse. Document Released: 03/19/2005 Document Revised: 09/01/2011 Document Reviewed: 12/27/2010 [facility] Patient Information 2015 [place], [place]. This information is not intended to replace advice given to you by your health care provider. Make sure you discuss any questions you have  with your health care provider.  Insomnia Insomnia is frequent trouble falling and/or staying asleep. Insomnia can be a long term problem or a short term problem. Both are common. Insomnia can be a short term problem when the wakefulness is related to a certain stress or worry. Long term insomnia is often related to ongoing stress during waking hours and/or poor sleeping habits. Overtime, sleep deprivation itself can make the problem worse. Every little thing feels more severe because you are overtired and your ability to cope is decreased. CAUSES   Stress, anxiety, and depression.  Poor sleeping habits.  Distractions such as TV in the bedroom.  Naps close to bedtime.  Engaging in emotionally charged conversations before bed.  Technical reading before sleep.  Alcohol and other sedatives. They may make the problem worse. They can hurt normal sleep patterns and normal dream activity.  Stimulants such as caffeine for several hours prior to bedtime.  Pain syndromes and shortness of breath can cause insomnia.  Exercise late at night.  Changing time zones may cause sleeping problems (jet lag). It is sometimes helpful to have someone observe your sleeping patterns. They should look for periods of not breathing during the night (sleep apnea). They should also look to see how long those periods last. If you live alone or observers are uncertain, you can also be observed at a sleep clinic where your sleep patterns will be professionally monitored. Sleep apnea requires a checkup and treatment. Give your caregivers your medical history. Give your caregivers observations your family has made about your sleep.  SYMPTOMS  Not feeling rested in the morning.  Anxiety and restlessness at bedtime.  Difficulty falling and staying asleep. TREATMENT   Your caregiver may prescribe treatment for an underlying medical disorders. Your caregiver can give advice or help if you are using alcohol or other  drugs for self-medication. Treatment of underlying problems will usually eliminate insomnia problems.  Medications can be prescribed for short time use. They are generally not recommended for lengthy use.  Over-the-counter sleep medicines are not recommended for lengthy use. They can be habit forming.  You can promote easier sleeping by making lifestyle changes such as:  Using relaxation techniques that help with breathing and reduce muscle tension.  Exercising earlier in the day.  Changing your diet and the time of your last meal. No night time snacks.  Establish a regular time to go to bed.  Counseling can help with stressful problems and worry.  Soothing music and white noise may be helpful if there are background noises you cannot remove.  Stop tedious detailed work at least one hour before bedtime. HOME CARE INSTRUCTIONS   Keep a diary. Inform your caregiver about your progress. This includes any medication side effects. See your caregiver regularly. Take note of:  Times when you are asleep.  Times when you are awake during the night.  The quality of your sleep.  How you feel the next day. This information will help your caregiver care for you.  Get out of bed if you are still awake after 15 minutes. Read or do some quiet activity. Keep the lights down. Wait until you feel sleepy and go back to bed.  Keep regular sleeping and waking hours. Avoid naps.  Exercise regularly.  Avoid distractions at bedtime. Distractions include watching television or engaging in any intense or detailed activity like attempting to balance the household checkbook.  Develop a bedtime ritual. Keep a familiar routine of bathing, brushing your teeth, climbing into bed at the same time each night, listening to soothing music. Routines increase the success of falling to sleep faster.  Use relaxation techniques. This can be using breathing and muscle tension release routines. It can also include  visualizing peaceful scenes. You can also help control troubling or intruding thoughts by keeping your mind occupied with boring or repetitive thoughts like the old concept of counting sheep. You can make it more creative like imagining planting one beautiful flower after another in your backyard garden.  During your day, work to eliminate stress. When this is not possible use some of the previous suggestions to help reduce the anxiety that accompanies stressful situations. MAKE SURE YOU:   Understand these instructions.  Will watch your condition.  Will get help right away if you are not doing well or get worse. Document Released: 06/06/2000 Document Revised: 09/01/2011 Document Reviewed: 07/07/2007 [facility] Patient Information 2015 [place], [place]. This information is not intended to replace advice given to you by your health care provider. Make sure you discuss any questions you have with your health care provider.    Emergency Department Resource Guide 1) Find a Doctor and Pay Out of Pocket Although you won't have to find out who is covered by your insurance plan, it is a good idea to ask around and get recommendations. You will then need to call the office and see if the doctor you have chosen will accept you as a new patient and what types of options they offer for patients who are self-pay. Some doctors offer discounts or will set up  payment plans for their patients who do not have insurance, but you will need to ask so you aren't surprised when you get to your appointment.  2) Contact Your Local Health Department Not all health departments have doctors that can see patients for sick visits, but many do, so it is worth a call to see if yours does. If you don't know where your local health department is, you can check in your phone book. The CDC also has a tool to help you locate your state's health department, and many state websites also have listings of all of their local health  departments.  3) Find a Colon Clinic If your illness is not likely to be very severe or complicated, you may want to try a walk in clinic. These are popping up all over the country in pharmacies, drugstores, and shopping centers. They're usually staffed by nurse practitioners or physician assistants that have been trained to treat common illnesses and complaints. They're usually fairly quick and inexpensive. However, if you have serious medical issues or chronic medical problems, these are probably not your best option.  No Primary Care Doctor: - Call Health Connect at  [phone] - they can help you locate a primary care doctor that  accepts your insurance, provides certain services, etc. - Physician Referral Service- [phone]  Chronic Pain Problems: Organization         Address  Phone   Notes  [facility]  [phone] Patients need to be referred by their primary care doctor.   Medication Assistance: Organization         Address  Phone   Notes  [facility] Medication [facility] [address] [place], [place] [address] [phone] --Must be a resident of [facility] -- Must have NO insurance coverage whatsoever (no Medicaid/ Medicare, etc.) -- The pt. MUST have a primary care doctor that directs their care regularly and follows them in the community   MedAssist  [phone]   [employer]  [phone]    Agencies that provide inexpensive medical care: Organization         Address  Phone   Notes  [place]  [phone]   [facility]    [phone]   [facility] [place], [place] [address] [phone]   [place] [address] [place] [phone]   Planned Parenthood    [phone]   [facility]    [phone]   [place] and [place] [address], [place] Phone:  [phone], Fax:  [phone] Hours of Operation:  9 am - 6 pm, M-F.  Also accepts Medicaid/Medicare and self-pay.  [facility] [place] [address], [address], [place] Phone: [phone], Fax: [phone]. Hours of Operation:  8:30 am - 5:30 pm, M-F.  Also accepts Medicaid and self-pay.  [facility] [address] [place] Phone: [phone]   [place], [place], [place] [phone], Ext. [phone] Mondays & Thursdays: 7-9 AM.  First 15 patients are seen on a first come, first serve basis.    [place] Providers:  Organization         Address  Phone   Notes  [facility] [address], [place] [phone] Also accepts  self-pay patients.  [facility] [address], [place]  [phone]   [place] [address], [place] [phone]   [facility] [address] [place] [phone]   Lucianne Lei [address], [place]   [phone] Only accepts [place] Access [place] patients after they have their name applied to their card.   Self-Pay (no insurance) in [facility]:  Organization         Address  Phone   Notes  Sickle Cell Patients, [facility] [place] [phone]   [facility] [place] [phone]   [facility] [place], [place], [place] [phone]   [facility]/Dr. Osei-Bonsu  [address] [place] or [address], [place] [phone] Phone number for both [place] and [place] locations is the same.  Urgent Medical and [facility] [address], [place] [phone]   [facility] [address] [place] or [address] [phone]   [facility] [address] [place] [phone], phone; [phone], fax Sees patients 1st and 3rd Saturday of every month.  Must not qualify for public or private insurance (i.e. Medicaid, Medicare, [place] Health Choice, Veterans' Benefits)  Household income should be no more than 200% of the poverty level The clinic cannot treat you if you are pregnant or think you are pregnant  Sexually transmitted diseases are not treated at the clinic.    Dental Care: Organization         Address  Phone  Notes  [facility] Department of [facility] [place] [phone] Accepts children up to age 51 who are enrolled in [place] or [place]; pregnant women with a Medicaid card; and children who have applied for Medicaid or [place] Health Choice, but were declined, whose parents can pay a reduced fee at time of service.  [facility] Department of [facility]  [address], [place] [phone] Accepts children up to age 20 who are enrolled in [place] or [place]; pregnant women with a Medicaid card; and children who have applied for Medicaid or [place] Health Choice, but were declined, whose parents can pay a reduced fee at time of service.  [place] Adult Dental Access PROGRAM  [place] [phone] Patients are seen by appointment only. Walk-ins are not accepted. [place] will see patients 61 years of age and older. Monday - Tuesday (8am-5pm) Most Wednesdays (8:30-5pm) $30 per visit, cash only  [facility] Adult Dental Access PROGRAM  [address] [place], [facility] [phone] Patients are seen by appointment only. Walk-ins are not accepted. [place] will see patients 68 years of age and older. One Wednesday Evening (Monthly: Volunteer Based).  $30 per visit, cash only  [place]  [phone] for adults;  Children under age 25, call Graduate Pediatric Dentistry at [phone]. Children aged 27-14, please call [phone] to request a pediatric application.  Dental services are provided in all areas of dental care including fillings, crowns and bridges, complete and partial dentures, implants, gum treatment, root canals, and extractions. Preventive care is also provided. Treatment is provided  to both adults and children. Patients are selected via a lottery and there is often a waiting list.   [facility] [address], [place]  [phone] [email or web address]   [facility] [address] [place], [place] [phone], Ext. [phone] Second and Fourth Thursday of each month, opens at 6:30 AM; Clinic ends at 9 AM.  Patients are seen on a first-come first-served basis, and a limited number are seen during each clinic.   [facility]  [address] Hillard Danker [place], [place] [phone]   Eligibility Requirements You must have lived in [place], [place], or [place] counties for at least the last three months.   You cannot be eligible for state or federal sponsored [employer], including [employer], [place], or [employer].   You generally cannot be eligible for healthcare insurance through your employer.    How to apply: Eligibility screenings are held every Tuesday and Wednesday afternoon from 1:00 pm until 4:00 pm. You do not need an appointment for the interview!  [facility] [address], [place], [place]   [place]  [place] Department  [place]  [phone]    Behavioral Health Resources in the Community: Intensive Outpatient Programs Organization         Address  Phone  Notes  [place]. [address] [place], [place] [phone]   [facility] Outpatient [address], [place], [place]   ADS: Alcohol & Drug Svcs [address], [place], [place]   [place] [address],  [place], [place] or [phone]   Substance Abuse Resources Organization         Address  Phone  Notes  Alcohol and Drug Services  [phone]   [place]  [phone]   [facility] [place]   Chinita Pester  [phone]   Residential & Outpatient Substance Abuse Program  [phone]   Psychological Services Organization         Address  Phone  Notes  [facility] [place]  [place]  [phone]   [place] [address] [place] or [phone]    Mobile Crisis Teams Organization         Address  Phone  Notes  [facility], Mobile Crisis Care Unit  [phone]   [facility] Psych[facility]  [address] [place], [place]   [place] [address], [place] [phone]    Self-Help/Support Groups Organization         Address  Phone             Notes  [place]. of [place] - variety of support groups  [place] Call for more information  Narcotics Anonymous (NA), [facility] [address], [employer] [place]  2 meetings at this location   Special educational needs teacher         Address  Phone  Notes  [facility]AP Residential Treatment [place],    [place]  [phone]   [facility]  [address], [place] [address], [place], [place]   [place], [place] [phone] Admissions: 8am-3pm M-F  Incentives Substance [facility] [address],    [place], [place] [phone]  [facility] [address] Jadene Pierini [place], [place]   The [place].,  [place], [place]   Insight Programs - Intensive  Outpatient [address] Kristeen [address], [place], [place]   [facility] [address]  [place], [place] [phone] or [phone]   [facility] Services (RTS) [address] [place], [place] Accepts Medicaid  [facility] [place] [address],  [place] 1-5611728959 Substance Abuse/Addiction Treatment   [facility] Organization         Address  Phone  Notes  [facility]  [phone]   Domenic Schwab, PhD [address] Arlis Porta [place], [place]   [phone] or [phone]   [place], [place] [phone]   [facility] [address], [place], [place] [phone] Insurance/Medicaid/sponsorship through [facility] and Families [address]                                    [place], [place] [phone] [place] [address], [place] [phone]    Dr. Adele Schilder  [phone]   [facility] Dept. 1) [address] [place] 2) [place] 3)  [place], [place] [phone]  [phone]   [place] [phone] or [phone] (After Hours)

## 2014-08-24 NOTE — ED Notes (Signed)
MD at bedside. EDP WARD PRESENT

## 2014-08-24 NOTE — ED Notes (Signed)
Pt c/o epigastric pain that radiates to lt chest and back.  States that this began last night at appx 10 pm.  States hx of anxiety which she believes may be contributing.  Pt describes the pain as a burning, stabbing pain.  Also c/o diarrhea that started 2 days ago.  Nausea but no vomiting.

## 2014-08-24 NOTE — ED Provider Notes (Signed)
TIME SEEN: 8:20 AM  CHIEF COMPLAINT: Chest pain  HPI: Pt is a 44 y.o. female with history of hypertensive cardiomyopathy, hyperlipidemia, alcohol abuse who presents to the emergency department with complaints of chest pain. States she has had intermittent, sharp, moderate chest pain for the past several days without aggravating or relieving factors that does not radiate. Describes his pain is only lasting for several seconds and then completely resolves. States she thinks that her anxiety is contributing to this. Denies any shortness of breath associated with this pain.  It is not exertional or pleuritic.  States that 2 days ago she began having a diffuse burning chest pain that radiates into her back is worse with eating.  Has tried drinking milk and taking toms without relief. Has had similar pain with GERD in the past. No alleviating factors. She has had nausea with this pain. She is also had intermittent lightheadedness but no near syncopal or syncopal events. States she was concerned because both of her parents have history of coronary artery disease. She has a history of tobacco use and is on Depo-Provera but denies any history of prior PE or DVT. No lower extremity swelling or pain. No recent fracture, surgery, trauma, hospitalization.  Denies fever or cough. No vomiting. Has had 2 days of nonbloody diarrhea, no melena.  PCP - none Cardiologist - Dr. Katharina Caper   ROS: See HPI Constitutional: no fever  Eyes: no drainage  ENT: no runny nose   Cardiovascular:   chest pain  Resp: no SOB  GI: no vomiting GU: no dysuria Integumentary: no rash  Allergy: no hives  Musculoskeletal: no leg swelling  Neurological: no slurred speech ROS otherwise negative  PAST MEDICAL HISTORY/PAST SURGICAL HISTORY:  Past Medical History  Diagnosis Date  . Foot fracture 01-07-2011  . Hypertensive cardiomyopathy   . Depression   . Hyperlipidemia   . Anxiety   . Alcohol abuse     MEDICATIONS:  Prior to  Admission medications   Medication Sig Start Date End Date Taking? Authorizing Provider  acamprosate (CAMPRAL) 333 MG tablet Take 2 tablets (666 mg total) by mouth 3 (three) times daily with meals. For alcohol dependence 04/07/14   Dara Hoyer, PA-C  hydrOXYzine (ATARAX/VISTARIL) 25 MG tablet Take 1 tablet (25 mg) three times daily as needed for anxiety 03/20/14   Encarnacion Slates, NP  medroxyPROGESTERone (DEPO-PROVERA) 150 MG/ML injection Inject 1 mL (150 mg total) into the muscle every 3 (three) months. For birth control 03/20/14   Encarnacion Slates, NP  temazepam (RESTORIL) 30 MG capsule Take 1 capsule (30 mg total) by mouth at bedtime. For sleep 03/20/14   Encarnacion Slates, NP    ALLERGIES:  Allergies  Allergen Reactions  . Suboxone [Buprenorphine Hcl-Naloxone Hcl] Shortness Of Breath    dizziness     SOCIAL HISTORY:  History  Substance Use Topics  . Smoking status: Current Some Day Smoker -- 0.01 packs/day    Types: Cigarettes  . Smokeless tobacco: Never Used     Comment: 1 Cigarettes every other Day  . Alcohol Use: 9.0 oz/week    15 Shots of liquor per week     Comment: daily    FAMILY HISTORY: Family History  Problem Relation Age of Onset  . Hypertension Mother   . Heart disease Mother   . Hyperlipidemia Mother   . Heart disease Father   . Alcohol abuse Brother   . Alcohol abuse Paternal Uncle     EXAM: BP 116/76  mmHg  Pulse 95  Temp(Src) 97.6 F (36.4 C) (Oral)  Resp 18  Ht 5\' 4"  (1.626 m)  Wt 162 lb (73.483 kg)  BMI 27.79 kg/m2  SpO2 100% CONSTITUTIONAL: Alert and oriented and responds appropriately to questions. Well-appearing; well-nourished HEAD: Normocephalic EYES: Conjunctivae clear, PERRL ENT: normal nose; no rhinorrhea; moist mucous membranes; pharynx without lesions noted NECK: Supple, no meningismus, no LAD  CARD: RRR; S1 and S2 appreciated; no murmurs, no clicks, no rubs, no gallops RESP: Normal chest excursion without splinting or tachypnea; breath  sounds clear and equal bilaterally; no wheezes, no rhonchi, no rales,  chest wall nontender to palpation, painful senses, no hypoxia or respiratory distress ABD/GI: Normal bowel sounds; non-distended; soft, non-tender, no rebound, no guarding BACK:  The back appears normal and is non-tender to palpation, there is no CVA tenderness EXT: Normal ROM in all joints; non-tender to palpation; no edema; normal capillary refill; no cyanosis; no Tenderness or swelling     SKIN: Normal color for age and race; warm NEURO: Moves all extremities equally PSYCH: The patient's mood and manner are appropriate. Grooming and personal hygiene are appropriate.  MEDICAL DECISION MAKING:  Patient here with atypical chest pain but does have risk factors for ACS including family history, tobacco use. Also has risk factors for pulmonary embolus - depo and smoking.  EKG shows no significant change from prior EKG and no ischemic changes, arrhythmia. Vital signs stable. Will treat with GI cocktail, Protonix, IV fluids and Zofran. We'll also obtain cardiac labs, chest x-ray. We'll also obtain d-dimer.  ED PROGRESS: Patient's labs are unremarkable. Negative troponin with constant burning chest pain since last night. Her sharp pain is very atypical and last several seconds. Still hemodynamically stable. EKG shows no ischemic changes. Chest x-ray clear. Troponin negative. D-dimer negative. BNP normal. LFTs and lipase normal. Abdominal exam benign. I do not feel patient needs admission at this time. Pain likely related to GERD, gastritis. Pain is gone after GI cocktail and Protonix. We'll discharge with prescription for Protonix. She has a therapist to see for her anxiety. Denies SI or HI. She reports because of her anxiety she is having worsening insomnia. She has had long-standing history of insomnia and has tried Ambien without relief. She is also tried over-the-counter melatonin without relief. She states she's taken Restoril in the  past and this is helped significantly. We'll give her a refill for this medication but have advised outpatient follow-up. Will give PCP follow-up information. Discussed avoiding alcohol, NSAIDs, spicy, acidic and greasy foods at this time. Recommended bland diet. She verbalized understanding and is comfortable with this plan. Discussed return precautions.      EKG Interpretation  Date/Time:  Thursday August 24 2014 08:02:32 EST Ventricular Rate:  89 PR Interval:  130 QRS Duration: 76 QT Interval:  370 QTC Calculation: 450 R Axis:   25 Text Interpretation:  Sinus rhythm Abnormal R-wave progression, early transition No significant change since last tracing Confirmed by Tomeshia Pizzi,  DO, Aneliese Beaudry [id number]) on 08/24/2014 8:18:43 AM        [place], DO 08/24/14 1006

## 2014-09-12 ENCOUNTER — Emergency Department (HOSPITAL_COMMUNITY): Payer: 59

## 2014-09-12 ENCOUNTER — Emergency Department (HOSPITAL_COMMUNITY)
Admission: EM | Admit: 2014-09-12 | Discharge: 2014-09-12 | Disposition: A | Discharge status: Home / Self Care | Payer: 59 | Attending: Emergency Medicine | Admitting: Emergency Medicine

## 2014-09-12 ENCOUNTER — Encounter (HOSPITAL_COMMUNITY): Payer: Self-pay

## 2014-09-12 DIAGNOSIS — I119 Hypertensive heart disease without heart failure: Secondary | ICD-10-CM | POA: Insufficient documentation

## 2014-09-12 DIAGNOSIS — Z8781 Personal history of (healed) traumatic fracture: Secondary | ICD-10-CM | POA: Insufficient documentation

## 2014-09-12 DIAGNOSIS — J020 Streptococcal pharyngitis: Secondary | ICD-10-CM | POA: Insufficient documentation

## 2014-09-12 DIAGNOSIS — Z792 Long term (current) use of antibiotics: Secondary | ICD-10-CM | POA: Insufficient documentation

## 2014-09-12 DIAGNOSIS — R05 Cough: Secondary | ICD-10-CM

## 2014-09-12 DIAGNOSIS — F419 Anxiety disorder, unspecified: Secondary | ICD-10-CM | POA: Diagnosis not present

## 2014-09-12 DIAGNOSIS — R059 Cough, unspecified: Secondary | ICD-10-CM

## 2014-09-12 DIAGNOSIS — I429 Cardiomyopathy, unspecified: Secondary | ICD-10-CM | POA: Insufficient documentation

## 2014-09-12 DIAGNOSIS — Z793 Long term (current) use of hormonal contraceptives: Secondary | ICD-10-CM | POA: Insufficient documentation

## 2014-09-12 DIAGNOSIS — Z79899 Other long term (current) drug therapy: Secondary | ICD-10-CM | POA: Diagnosis not present

## 2014-09-12 DIAGNOSIS — J111 Influenza due to unidentified influenza virus with other respiratory manifestations: Secondary | ICD-10-CM

## 2014-09-12 DIAGNOSIS — J029 Acute pharyngitis, unspecified: Secondary | ICD-10-CM | POA: Diagnosis present

## 2014-09-12 DIAGNOSIS — Z8639 Personal history of other endocrine, nutritional and metabolic disease: Secondary | ICD-10-CM | POA: Insufficient documentation

## 2014-09-12 DIAGNOSIS — F329 Major depressive disorder, single episode, unspecified: Secondary | ICD-10-CM | POA: Insufficient documentation

## 2014-09-12 DIAGNOSIS — Z720 Tobacco use: Secondary | ICD-10-CM | POA: Diagnosis not present

## 2014-09-12 DIAGNOSIS — R69 Illness, unspecified: Secondary | ICD-10-CM

## 2014-09-12 LAB — CBC WITH DIFFERENTIAL/PLATELET
Basophils Absolute: 0.1 10*3/uL (ref 0.0–0.1)
Basophils Relative: 1 % (ref 0–1)
Eosinophils Absolute: 0.2 10*3/uL (ref 0.0–0.7)
Eosinophils Relative: 2 % (ref 0–5)
HCT: 37.2 % (ref 36.0–46.0)
Hemoglobin: 12.5 g/dL (ref 12.0–15.0)
LYMPHS PCT: 12 % (ref 12–46)
Lymphs Abs: 1.2 10*3/uL (ref 0.7–4.0)
MCH: 30.6 pg (ref 26.0–34.0)
MCHC: 33.6 g/dL (ref 30.0–36.0)
MCV: 91.2 fL (ref 78.0–100.0)
MONO ABS: 0.7 10*3/uL (ref 0.1–1.0)
MONOS PCT: 7 % (ref 3–12)
NEUTROS ABS: 7.3 10*3/uL (ref 1.7–7.7)
Neutrophils Relative %: 78 % — ABNORMAL HIGH (ref 43–77)
Platelets: 350 10*3/uL (ref 150–400)
RBC: 4.08 MIL/uL (ref 3.87–5.11)
RDW: 13.8 % (ref 11.5–15.5)
WBC: 9.3 10*3/uL (ref 4.0–10.5)

## 2014-09-12 LAB — BASIC METABOLIC PANEL
Anion gap: 10 (ref 5–15)
BUN: 7 mg/dL (ref 6–23)
CALCIUM: 8.4 mg/dL (ref 8.4–10.5)
CO2: 22 mmol/L (ref 19–32)
CREATININE: 0.53 mg/dL (ref 0.50–1.10)
Chloride: 108 mmol/L (ref 96–112)
GFR calc Af Amer: >90 mL/min (ref >90)
Glucose, Bld: 88 mg/dL (ref 70–99)
Potassium: 3.4 mmol/L — ABNORMAL LOW (ref 3.5–5.1)
SODIUM: 140 mmol/L (ref 135–145)

## 2014-09-12 LAB — I-STAT BETA HCG BLOOD, ED (MC, WL, AP ONLY): I-stat hCG, quantitative: <5 m[IU]/mL (ref <5)

## 2014-09-12 LAB — RAPID STREP SCREEN (MED CTR MEBANE ONLY): Streptococcus, Group A Screen (Direct): POSITIVE — AB

## 2014-09-12 LAB — I-STAT CG4 LACTIC ACID, ED: Lactic Acid, Venous: 1.41 mmol/L (ref 0.5–2.0)

## 2014-09-12 MED ORDER — ACETAMINOPHEN 325 MG PO TABS
650.0000 mg | ORAL_TABLET | Freq: Once | ORAL | Status: AC
  Administered 2014-09-12: 650 mg via ORAL
  Filled 2014-09-12: qty 2

## 2014-09-12 MED ORDER — KETOROLAC TROMETHAMINE 15 MG/ML IJ SOLN
15.0000 mg | Freq: Once | INTRAMUSCULAR | Status: AC
  Administered 2014-09-12: 15 mg via INTRAVENOUS
  Filled 2014-09-12: qty 1

## 2014-09-12 MED ORDER — HYDROCODONE-ACETAMINOPHEN 5-325 MG PO TABS: ORAL_TABLET | ORAL | Status: DC

## 2014-09-12 MED ORDER — PENICILLIN G BENZATHINE 1200000 UNIT/2ML IM SUSP
1.2000 10*6.[IU] | Freq: Once | INTRAMUSCULAR | Status: AC
  Administered 2014-09-12: 1.2 10*6.[IU] via INTRAMUSCULAR
  Filled 2014-09-12: qty 2

## 2014-09-12 MED ORDER — OSELTAMIVIR PHOSPHATE 75 MG PO CAPS
75.0000 mg | ORAL_CAPSULE | Freq: Once | ORAL | Status: AC
  Administered 2014-09-12: 75 mg via ORAL
  Filled 2014-09-12 (×2): qty 1

## 2014-09-12 MED ORDER — SODIUM CHLORIDE 0.9 % IV BOLUS (SEPSIS)
2000.0000 mL | Freq: Once | INTRAVENOUS | Status: AC
Start: 2014-09-12 — End: 2014-09-12
  Administered 2014-09-12: 2000 mL via INTRAVENOUS

## 2014-09-12 MED ORDER — DEXAMETHASONE SODIUM PHOSPHATE 10 MG/ML IJ SOLN
10.0000 mg | Freq: Once | INTRAMUSCULAR | Status: AC
  Administered 2014-09-12: 10 mg via INTRAMUSCULAR
  Filled 2014-09-12: qty 1

## 2014-09-12 MED ORDER — OSELTAMIVIR PHOSPHATE 75 MG PO CAPS: 75.0000 mg | ORAL_CAPSULE | Freq: Two times a day (BID) | ORAL | Status: DC

## 2014-09-12 NOTE — Discharge Instructions (Signed)
Return to the emergency room for any worsening or concerning symptoms including fast breathing, heart racing, confusion, vomiting.  Rest, cover your mouth when you cough and wash your hands frequently.   Push fluids: water or Gatorade, do not drink any soda, juice or caffeinated beverages.  For fever and pain control you can take Motrin (ibuprofen) as follows: 400 mg (this is normally 2 over the counter pills) every 4 hours with food.  Do not return to work until a day after your fever breaks.   Take Vicodin for cough and pain control, do not drink alcohol, drive, care for children or do other critical tasks while taking Vicodin     Do not hesitate to return to the emergency room for any new, worsening or concerning symptoms.  Please obtain primary care using resource guide below. But the minute you were seen in the emergency room and that they will need to obtain records for further outpatient management.    Emergency Department Resource Guide 1) Find a Doctor and Pay Out of Pocket Although you won't have to find out who is covered by your insurance plan, it is a good idea to ask around and get recommendations. You will then need to call the office and see if the doctor you have chosen will accept you as a new patient and what types of options they offer for patients who are self-pay. Some doctors offer discounts or will set up payment plans for their patients who do not have insurance, but you will need to ask so you aren't surprised when you get to your appointment.  2) Contact Your Local Health Department Not all health departments have doctors that can see patients for sick visits, but many do, so it is worth a call to see if yours does. If you don't know where your local health department is, you can check in your phone book. The CDC also has a tool to help you locate your state's health department, and many state websites also have listings of all of their local health  departments.  3) Find a [facility] If your illness is not likely to be very severe or complicated, you may want to try a walk in clinic. These are popping up all over the country in pharmacies, drugstores, and shopping centers. They're usually staffed by nurse practitioners or physician assistants that have been trained to treat common illnesses and complaints. They're usually fairly quick and inexpensive. However, if you have serious medical issues or chronic medical problems, these are probably not your best option.  No Primary Care Doctor: - Call Health Connect at  [phone] - they can help you locate a primary care doctor that  accepts your insurance, provides certain services, etc. - Physician Referral Service- [phone]  Chronic Pain Problems: Organization         Address  Phone   Notes  [facility]  [phone] Patients need to be referred by their primary care doctor.   Medication Assistance: Organization         Address  Phone   Notes  [facility] Medication [facility] [place], [place] [address] [phone] --Must be a resident of [facility] -- Must have NO insurance coverage whatsoever (no Medicaid/ Medicare, etc.) -- The pt. MUST have a primary care doctor that directs their care regularly and follows them in the community   Med[facility]ist  [phone]   [address]  [phone]  Agencies that provide inexpensive medical care: Organization         Address  Phone   Notes  [place]  [phone]   [facility]    [phone]   [facility] [place], [place] [address] [phone]   [place] [address] [place] [phone]   Planned Parenthood    [phone]   [facility]    [phone]   [place] and [place] [address], [place] Phone:  [phone], Fax:  [phone] Hours of Operation:  9 am - 6 pm, M-F.  Also accepts Medicaid/Medicare and self-pay.  [facility] [place] [place], [address], [place] Phone: [phone], Fax: [phone]. Hours of Operation:  8:30 am - 5:30 pm, M-F.  Also accepts Medicaid and self-pay.  [facility] [place] [address], [place] Phone: [phone]   [place], [place], [place] [phone], Ext. [phone] Mondays & Thursdays: 7-9 AM.  First 15 patients are seen on a first come, first serve basis.    [place] Providers:  Organization         Address  Phone   Notes  [facility] [address], [place] [phone] Also accepts self-pay patients.  [facility] [address], [place]  [phone]   [place], [address], [place] [phone]   [facility] [address], [place] [phone]   Lucianne Lei [address], [place]   [phone] Only accepts [place] Access [place] patients after they have their name applied to their card.   Self-Pay (no insurance) in [facility]:  Organization         Address  Phone   Notes  Sickle Cell Patients, [facility] [place] [phone]   [facility] [place] [phone]   [facility] [place]  [place], [place], [place] [phone]   [facility]/Dr. Osei-Bonsu  [address], [place] or [address], [place] [phone] Phone number for both [place] and [place] locations is the same.  Urgent Medical and [facility] [address] [place] [phone]   [facility] [address], [place] or [address] [phone]   [facility] [address] [place] [phone], phone; [phone], fax Sees patients 1st and 3rd Saturday of every month.  Must not qualify for public or private insurance (i.e. Medicaid, Medicare, [place] Health Choice, Veterans' Benefits)  Household income should be no more than 200% of the poverty level The clinic cannot treat you if you are pregnant or think you are pregnant  Sexually transmitted diseases are not treated at the clinic.    Dental Care: Organization         Address  Phone  Notes  [facility] Department of [facility] [phone] Accepts children up to age 13 who are enrolled in [place] or [place]; pregnant women with a Medicaid card; and children who have applied for Medicaid or  [place] Health Choice, but were declined, whose parents can pay a reduced fee at time of service.  [facility] Department of [facility]  [address], [place] [phone] Accepts children up to age 75 who are enrolled in [place] or [place]; pregnant women with a Medicaid card; and children who have applied for Medicaid or [place] Health Choice, but were declined, whose parents can pay a reduced fee at time of service.  [place] Adult Dental Access PROGRAM  [place] [phone] Patients are seen by appointment only. Walk-ins are not accepted. [place] will see patients 44 years of age and older. Monday - Tuesday (8am-5pm) Most Wednesdays (8:30-5pm) $30 per visit, cash only  [facility] Adult Dental Access PROGRAM  [address], [facility] [phone] Patients are seen by appointment only. Walk-ins are not accepted. [place] will see patients 78 years of age and older. One Wednesday Evening (Monthly: Volunteer Based).  $30 per visit, cash only  [place]  [phone] for adults;  Children under age 52, call Graduate Pediatric Dentistry at [phone]. Children aged 86-14, please call [phone] to request a pediatric application.  Dental services are provided in all areas of dental care including fillings, crowns and bridges, complete and partial dentures, implants, gum treatment, root canals, and extractions. Preventive care is also provided. Treatment is provided to both adults and children. Patients are selected via a lottery and there is often a waiting list.   [facility] [address] [place]  [phone] [email or web address]   [facility] [address] [place], [place] [phone], Ext. [phone] Second and Fourth Thursday of each month, opens at 6:30 AM; Clinic ends at 9 AM.  Patients are seen on a first-come first-served basis, and a limited number are seen during each clinic.   [facility]  [address] Hillard Danker [place], [place] [phone]   Eligibility Requirements You must have lived in [place], [place], or [place] counties for at least the last three months.   You cannot be eligible for state or federal sponsored [employer], including [employer], [place], or [employer].   You generally cannot be eligible for healthcare insurance through your employer.    How to apply: Eligibility screenings are held every Tuesday and Wednesday afternoon from 1:00 pm until 4:00 pm. You do not need an appointment for the interview!  [facility] [address], [employer], [place]   [place]  [place] Department  [place]  [phone]    Behavioral Health Resources in the Community: Intensive Outpatient Programs Organization         Address  Phone  Notes  [place] Rudy. [address], [place], [place] [phone]   [facility] Outpatient [address] [place], [place]   ADS: Alcohol & Drug Svcs [address] [place], [place]   [place] [address],  [place], [place] or [phone]   Substance Abuse Resources Organization         Address  Phone  Notes  Alcohol and Drug Services  [phone]   [facility]  [phone]   [facility] [place]  [phone]   Chinita Pester  [phone]   Residential & Outpatient Substance Abuse Program  [phone]  Psychological Services Organization         Address  Phone  Notes  [facility] [place]  [place]  [phone]   [place] [address], [place] or [phone]    Mobile Crisis Teams Organization         Address  Phone  Notes  [facility], Mobile Crisis Care Unit  [phone]   [facility]  [address] [place], [place]   [place] [address] [place] [phone]    Self-Help/Support Groups Organization         Address  Phone             Notes  [place]. of [place] - variety of support groups  [place] Call for more information  Narcotics Anonymous (NA), [facility] [address], [employer] [place]  2 meetings at this location   Special educational needs teacher         Address  Phone  Notes  [facility] Residential Treatment [place],    [place]  [phone]   [facility]  [address], [place] [phone], [place], [place]   [place], [place] [phone] Admissions: 8am-3pm M-F  Incentives Substance [place] [address],    [place], [place] [phone]   [facility] [address] [place], [place], [place]   [facility] [address]  [place], [place]   [facility]ight Programs - Intensive  Outpatient [address], [place], [place]   [facility] [place].,  [place], [place] [phone] or [phone]   [facility]idential Treatment Services (RTS) [address] [place], [place] Accepts Medicaid  [facility] [place] [address],  [place] [phone] Substance Abuse/Addiction Treatment   [facility] Organization         Address  Phone  Notes  [facility]  [phone]   Domenic Schwab, PhD [address] Arlis Porta [place], [place]   [phone] or [phone]   [place], [place] [phone]   [facility] [address], [place], [place] [phone] Insurance/Medicaid/sponsorship through [facility] [place] and Families [address], [address] [place]                                    [place], [place] [phone] [place] [address], [place] [phone]    Dr. Adele Schilder  [phone]   [facility] Dept. 1) [address], [place] 2) [place] 3)  [place] [address], [place] [phone]  [phone]   [place] [phone] or [phone] (After Hours)

## 2014-09-12 NOTE — ED Notes (Signed)
Pt with body aches and sore throat since yesterday.  Pt went to urgent care. No tested for flu.  Given antibiotics for "ear infection".  Pt has taken 3 doses.  Pt unknown for fever.  Denies cough/congestion.

## 2014-09-12 NOTE — ED Provider Notes (Signed)
CSN: [id number]     Arrival date & time 09/12/14  7829 History   First MD Initiated Contact with Patient 09/12/14 [phone]     Chief Complaint  Patient presents with  . Sore Throat  . Generalized Body Aches     (Consider location/radiation/quality/duration/timing/severity/associated sxs/prior Treatment) HPI  Shannon Cervantes is a 44 y.o. female complaining of generalized fatigue, myalgia, runny nose, dry cough, severe pharyngitis, bilateral otalgia onset 2 days ago, significantly worsening over the last 24 hours. Patient was seen at urgent care and given cefdinir for ear infection. States that she has taken 3 doses but she feels significantly worse. She denies fever, chills, chest pain, shortness of breath, focal abdominal pain, change in bowel or bladder habits. She did have her flu shot this year. She's been taking Motrin every 4-6 hours for pain and myalgia control.  Past Medical History  Diagnosis Date  . Foot fracture 01-07-2011  . Hypertensive cardiomyopathy   . Depression   . Hyperlipidemia   . Anxiety   . Alcohol abuse    Past Surgical History  Procedure Laterality Date  . Cesarean section     Family History  Problem Relation Age of Onset  . Hypertension Mother   . Heart disease Mother   . Hyperlipidemia Mother   . Heart disease Father   . Alcohol abuse Brother   . Alcohol abuse Paternal Uncle    History  Substance Use Topics  . Smoking status: Current Some Day Smoker -- 0.01 packs/day    Types: Cigarettes  . Smokeless tobacco: Never Used     Comment: 1 Cigarettes every other Day  . Alcohol Use: 9.0 oz/week    15 Shots of liquor per week     Comment: daily   OB History    No data available     Review of Systems  10 systems reviewed and found to be negative, except as noted in the HPI.   Allergies  Suboxone  Home Medications   Prior to Admission medications   Medication Sig Start Date End Date Taking? Authorizing Provider  cefdinir (OMNICEF) 300 MG  capsule Take 1 capsule by mouth every 12 (twelve) hours. For 10 days 09/11/14  Yes Historical Provider, MD  estazolam (PROSOM) 2 MG tablet Take 1 tablet by mouth at bedtime as needed. sleep 08/30/14  Yes Historical Provider, MD  ibuprofen (ADVIL,MOTRIN) 200 MG tablet Take 800 mg by mouth every 6 (six) hours as needed for headache or moderate pain.   Yes Historical Provider, MD  lidocaine (XYLOCAINE) 2 % solution Use as directed 15 mLs in the mouth or throat every 3 (three) hours as needed. Throat pain 09/11/14  Yes Historical Provider, MD  medroxyPROGESTERone (DEPO-PROVERA) 150 MG/ML injection Inject 1 mL (150 mg total) into the muscle every 3 (three) months. For birth control 03/20/14  Yes Encarnacion Slates, NP  pantoprazole (PROTONIX) 40 MG tablet Take 1 tablet (40 mg total) by mouth daily. 08/24/14  Yes Kristen N Ward, DO  temazepam (RESTORIL) 30 MG capsule Take 1 capsule (30 mg total) by mouth at bedtime as needed for sleep. 08/24/14  Yes Kristen N Ward, DO  valACYclovir (VALTREX) 500 MG tablet Take 1 tablet by mouth 2 (two) times daily as needed. outbreaks 08/29/14  Yes Historical Provider, MD  acamprosate (CAMPRAL) 333 MG tablet Take 2 tablets (666 mg total) by mouth 3 (three) times daily with meals. For alcohol dependence Patient not taking: Reported on 08/24/2014 04/07/14   Dara Hoyer,  PA-C  HYDROcodone-acetaminophen (NORCO/VICODIN) 5-325 MG per tablet Take 1-2 tablets by mouth every 6 hours as needed for pain and/or cough. 09/12/14   Amariah Kierstead, PA-C  hydrOXYzine (ATARAX/VISTARIL) 25 MG tablet Take 1 tablet (25 mg) three times daily as needed for anxiety Patient not taking: Reported on 08/24/2014 03/20/14   Encarnacion Slates, NP  ondansetron (ZOFRAN ODT) 4 MG disintegrating tablet Take 1 tablet (4 mg total) by mouth every 8 (eight) hours as needed for nausea or vomiting. Patient not taking: Reported on 09/12/2014 08/24/14   Delice Bison Ward, DO  oseltamivir (TAMIFLU) 75 MG capsule Take 1 capsule (75 mg total)  by mouth every 12 (twelve) hours. 09/12/14   Leianne Callins, PA-C   BP 127/70 mmHg  Pulse 102  Temp(Src) 101.8 F (38.8 C) (Oral)  Resp 18  SpO2 100% Physical Exam  Constitutional: She is oriented to person, place, and time. She appears well-developed and well-nourished. No distress.  HENT:  Head: Normocephalic and atraumatic.  Mouth/Throat: Oropharynx is clear and moist.  Posterior pharynx is injected, 2+ tonsillar hypertrophy bilaterally, there is exudate.  No drooling or stridor.  Soft palate rises symmetrically. No TTP or induration under tongue.   No tenderness to palpation of frontal or bilateral maxillary sinuses.  No mucosal edema in the nares.  Bilateral tympanic membranes opaque with normal architecture and good light reflex.    Eyes: Conjunctivae and EOM are normal. Pupils are equal, round, and reactive to light.  Neck: Normal range of motion. Neck supple.  FROM to C-spine. Pt can touch chin to chest without discomfort. No TTP of midline cervical spine.   Cardiovascular: Regular rhythm and intact distal pulses.   Tachycardiac  Pulmonary/Chest: Effort normal. No stridor. No respiratory distress. She has no wheezes. She has no rales. She exhibits no tenderness.  Abdominal: Soft. Bowel sounds are normal. She exhibits no distension and no mass. There is no tenderness. There is no rebound and no guarding.  Musculoskeletal: Normal range of motion.  Neurological: She is alert and oriented to person, place, and time.  Psychiatric: She has a normal mood and affect.  Nursing note and vitals reviewed.   ED Course  Procedures (including critical care time) Labs Review Labs Reviewed  RAPID STREP SCREEN - Abnormal; Notable for the following:    Streptococcus, Group A Screen (Direct) POSITIVE (*)    All other components within normal limits  CBC WITH DIFFERENTIAL/PLATELET - Abnormal; Notable for the following:    Neutrophils Relative % 78 (*)    All other components  within normal limits  BASIC METABOLIC PANEL - Abnormal; Notable for the following:    Potassium 3.4 (*)    All other components within normal limits  I-STAT BETA HCG BLOOD, ED ([facility], [facility], [facility] ONLY)  I-STAT CG4 LACTIC ACID, ED  I-STAT CG4 LACTIC ACID, ED    Imaging Review Dg Chest 2 View  09/12/2014   CLINICAL DATA:  Ear infection, cough, weakness  EXAM: CHEST  2 VIEW  COMPARISON:  08/24/2014  FINDINGS: Cardiomediastinal silhouette is stable. No acute infiltrate or pleural effusion. No pulmonary edema. Minimal lower thoracic dextroscoliosis.  IMPRESSION: No active cardiopulmonary disease.   Electronically Signed   By: Lahoma Crocker M.D.   On: 09/12/2014 10:41     EKG Interpretation None      MDM   Final diagnoses:  Cough  Strep pharyngitis  Influenza-like illness    Filed Vitals:   09/12/14 0906 09/12/14 1100 09/12/14 1234  BP: 152/87  135/85 127/70  Pulse: 114 109 102  Temp: 99.2 F (37.3 C) 102.1 F (38.9 C) 101.8 F (38.8 C)  TempSrc: Oral Oral Oral  Resp: 18 16 18   SpO2: 100% 100% 100%    Medications  oseltamivir (TAMIFLU) capsule 75 mg (75 mg Oral Given 09/12/14 1146)  sodium chloride 0.9 % bolus 2,000 mL (0 mLs Intravenous Stopped 09/12/14 1320)  penicillin g benzathine (BICILLIN LA) 1200000 UNIT/2ML injection 1.2 Million Units (1.2 Million Units Intramuscular Given 09/12/14 1145)  dexamethasone (DECADRON) injection 10 mg (10 mg Intramuscular Given 09/12/14 1145)  ketorolac (TORADOL) 15 MG/ML injection 15 mg (15 mg Intravenous Given 09/12/14 1203)  acetaminophen (TYLENOL) tablet 650 mg (650 mg Oral Given 09/12/14 1203)    Shannon Cervantes is a pleasant 44 y.o. female presenting with sore throat, myalgia, generalized fatigue, rhinorrhea and dry cough. Patient is tachycardic up to 115, patient is afebrile however she states that she's taking Motrin at home and this would mask a fever. Blood work is reassuring with lactic acid of 1.41 and no leukocytosis. Plan IV fluids. She is  being treated for otitis media with Omnicef. Reports worsening of condition. Strep test today is positive, responding to the patient that although Omnicef will treated I could give her Bicillin shot if she would like. Patient opts to have the injection. Based on her severe myalgia, fatigue, rhinorrhea, cough and this is likely influenza as well. I have discussed the pros and cons of Tamiflu and in shared decision-making she opts for the Tamiflu. Recommend aggressive hydration.  Evaluation does not show pathology that would require ongoing emergent intervention or inpatient treatment. Pt is hemodynamically stable and mentating appropriately. Discussed findings and plan with patient/guardian, who agrees with care plan. All questions answered. Return precautions discussed and outpatient follow up given.   Discharge Medication List as of 09/12/2014 12:57 PM    START taking these medications   Details  HYDROcodone-acetaminophen (NORCO/VICODIN) 5-325 MG per tablet Take 1-2 tablets by mouth every 6 hours as needed for pain and/or cough., Print    oseltamivir (TAMIFLU) 75 MG capsule Take 1 capsule (75 mg total) by mouth every 12 (twelve) hours., Starting 09/12/2014, Until Discontinued, Print             Monico Blitz, PA-C 09/12/14 [place], MD 09/12/14 [phone]

## 2014-09-12 NOTE — ED Notes (Signed)
PA at bedside.

## 2014-10-27 ENCOUNTER — Encounter: Payer: Self-pay | Admitting: Family

## 2014-10-27 ENCOUNTER — Ambulatory Visit (INDEPENDENT_AMBULATORY_CARE_PROVIDER_SITE_OTHER): Payer: 59 | Admitting: Family

## 2014-10-27 VITALS — BP 138/82 | HR 93 | Temp 97.8°F | Wt 168.8 lb

## 2014-10-27 DIAGNOSIS — Z0000 Encounter for general adult medical examination without abnormal findings: Secondary | ICD-10-CM

## 2014-10-27 MED ORDER — ONDANSETRON 4 MG PO TBDP: 4.0000 mg | ORAL_TABLET | Freq: Three times a day (TID) | ORAL | Status: DC | PRN

## 2014-10-27 MED ORDER — CLONAZEPAM 0.5 MG PO TABS: 0.5000 mg | ORAL_TABLET | Freq: Every day | ORAL | Status: DC

## 2014-10-27 NOTE — Patient Instructions (Addendum)
Thank you for choosing [facility].  Summary/Instructions:  Your prescription(s) have been submitted to your pharmacy or been printed and provided for you. Please take as directed and contact our office if you believe you are having problem(s) with the medication(s) or have any questions.  Please stop by the lab on the basement level of the building for your blood work. Your results will be released to [place] (or called to you) after review, usually within 72 hours after test completion. If any changes need to be made, you will be notified at that same time.  If your symptoms worsen or fail to improve, please contact our office for further instruction, or in case of emergency go directly to the emergency room at the closest medical facility.   Health Maintenance Adopting a healthy lifestyle and getting preventive care can go a long way to promote health and wellness. Talk with your health care provider about what schedule of regular examinations is right for you. This is a good chance for you to check in with your provider about disease prevention and staying healthy. In between checkups, there are plenty of things you can do on your own. Experts have done a lot of research about which lifestyle changes and preventive measures are most likely to keep you healthy. Ask your health care provider for more information. WEIGHT AND DIET  Eat a healthy diet  Be sure to include plenty of vegetables, fruits, low-fat dairy products, and lean protein.  Do not eat a lot of foods high in solid fats, added sugars, or salt.  Get regular exercise. This is one of the most important things you can do for your health.  Most adults should exercise for at least 150 minutes each week. The exercise should increase your heart rate and make you sweat (moderate-intensity exercise).  Most adults should also do strengthening exercises at least twice a week. This is in addition to the moderate-intensity exercise.   Maintain a healthy weight  Body mass index (BMI) is a measurement that can be used to identify possible weight problems. It estimates body fat based on height and weight. Your health care provider can help determine your BMI and help you achieve or maintain a healthy weight.  For females 31 years of age and older:   A BMI below 18.5 is considered underweight.  A BMI of 18.5 to 24.9 is normal.  A BMI of 25 to 29.9 is considered overweight.  A BMI of 30 and above is considered obese.  Watch levels of cholesterol and blood lipids  You should start having your blood tested for lipids and cholesterol at 43 years of age, then have this test every 5 years.  You may need to have your cholesterol levels checked more often if:  Your lipid or cholesterol levels are high.  You are older than 43 years of age.  You are at high risk for heart disease.  CANCER SCREENING   Lung Cancer  Lung cancer screening is recommended for adults 42-23 years old who are at high risk for lung cancer because of a history of smoking.  A yearly low-dose CT scan of the lungs is recommended for people who:  Currently smoke.  Have quit within the past 15 years.  Have at least a 30-pack-year history of smoking. A pack year is smoking an average of one pack of cigarettes a day for 1 year.  Yearly screening should continue until it has been 15 years since you quit.  Yearly  screening should stop if you develop a health problem that would prevent you from having lung cancer treatment.  Breast Cancer  Practice breast self-awareness. This means understanding how your breasts normally appear and feel.  It also means doing regular breast self-exams. Let your health care provider know about any changes, no matter how small.  If you are in your 20s or 30s, you should have a clinical breast exam (CBE) by a health care provider every 1-3 years as part of a regular health exam.  If you are 11 or older, have a  CBE every year. Also consider having a breast X-ray (mammogram) every year.  If you have a family history of breast cancer, talk to your health care provider about genetic screening.  If you are at high risk for breast cancer, talk to your health care provider about having an MRI and a mammogram every year.  Breast cancer gene (BRCA) assessment is recommended for women who have family members with BRCA-related cancers. BRCA-related cancers include:  Breast.  Ovarian.  Tubal.  Peritoneal cancers.  Results of the assessment will determine the need for genetic counseling and BRCA1 and BRCA2 testing. Cervical Cancer Routine pelvic examinations to screen for cervical cancer are no longer recommended for nonpregnant women who are considered low risk for cancer of the pelvic organs (ovaries, uterus, and vagina) and who do not have symptoms. A pelvic examination may be necessary if you have symptoms including those associated with pelvic infections. Ask your health care provider if a screening pelvic exam is right for you.   The Pap test is the screening test for cervical cancer for women who are considered at risk.  If you had a hysterectomy for a problem that was not cancer or a condition that could lead to cancer, then you no longer need Pap tests.  If you are older than 65 years, and you have had normal Pap tests for the past 10 years, you no longer need to have Pap tests.  If you have had past treatment for cervical cancer or a condition that could lead to cancer, you need Pap tests and screening for cancer for at least 20 years after your treatment.  If you no longer get a Pap test, assess your risk factors if they change (such as having a new sexual partner). This can affect whether you should start being screened again.  Some women have medical problems that increase their chance of getting cervical cancer. If this is the case for you, your health care provider may recommend more  frequent screening and Pap tests.  The human papillomavirus (HPV) test is another test that may be used for cervical cancer screening. The HPV test looks for the virus that can cause cell changes in the cervix. The cells collected during the Pap test can be tested for HPV.  The HPV test can be used to screen women 37 years of age and older. Getting tested for HPV can extend the interval between normal Pap tests from three to five years.  An HPV test also should be used to screen women of any age who have unclear Pap test results.  After 43 years of age, women should have HPV testing as often as Pap tests.  Colorectal Cancer  This type of cancer can be detected and often prevented.  Routine colorectal cancer screening usually begins at 43 years of age and continues through 43 years of age.  Your health care provider may recommend screening at an  earlier age if you have risk factors for colon cancer.  Your health care provider may also recommend using home test kits to check for hidden blood in the stool.  A small camera at the end of a tube can be used to examine your colon directly (sigmoidoscopy or colonoscopy). This is done to check for the earliest forms of colorectal cancer.  Routine screening usually begins at age 76.  Direct examination of the colon should be repeated every 5-10 years through 43 years of age. However, you may need to be screened more often if early forms of precancerous polyps or small growths are found. Skin Cancer  Check your skin from head to toe regularly.  Tell your health care provider about any new moles or changes in moles, especially if there is a change in a mole's shape or color.  Also tell your health care provider if you have a mole that is larger than the size of a pencil eraser.  Always use sunscreen. Apply sunscreen liberally and repeatedly throughout the day.  Protect yourself by wearing long sleeves, pants, a wide-brimmed hat, and sunglasses  whenever you are outside. HEART DISEASE, DIABETES, AND HIGH BLOOD PRESSURE   Have your blood pressure checked at least every 1-2 years. High blood pressure causes heart disease and increases the risk of stroke.  If you are between 6 years and 75 years old, ask your health care provider if you should take aspirin to prevent strokes.  Have regular diabetes screenings. This involves taking a blood sample to check your fasting blood sugar level.  If you are at a normal weight and have a low risk for diabetes, have this test once every three years after 43 years of age.  If you are overweight and have a high risk for diabetes, consider being tested at a younger age or more often. PREVENTING INFECTION  Hepatitis B  If you have a higher risk for hepatitis B, you should be screened for this virus. You are considered at high risk for hepatitis B if:  You were born in a country where hepatitis B is common. Ask your health care provider which countries are considered high risk.  Your parents were born in a high-risk country, and you have not been immunized against hepatitis B (hepatitis B vaccine).  You have HIV or AIDS.  You use needles to inject street drugs.  You live with someone who has hepatitis B.  You have had sex with someone who has hepatitis B.  You get hemodialysis treatment.  You take certain medicines for conditions, including cancer, organ transplantation, and autoimmune conditions. Hepatitis C  Blood testing is recommended for:  Everyone born from [birth year] through 1964.  Anyone with known risk factors for hepatitis C. Sexually transmitted infections (STIs)  You should be screened for sexually transmitted infections (STIs) including gonorrhea and chlamydia if:  You are sexually active and are younger than 43 years of age.  You are older than 43 years of age and your health care provider tells you that you are at risk for this type of infection.  Your sexual activity  has changed since you were last screened and you are at an increased risk for chlamydia or gonorrhea. Ask your health care provider if you are at risk.  If you do not have HIV, but are at risk, it may be recommended that you take a prescription medicine daily to prevent HIV infection. This is called pre-exposure prophylaxis (PrEP). You are considered at risk  if:  You are sexually active and do not regularly use condoms or know the HIV status of your partner(s).  You take drugs by injection.  You are sexually active with a partner who has HIV. Talk with your health care provider about whether you are at high risk of being infected with HIV. If you choose to begin PrEP, you should first be tested for HIV. You should then be tested every 3 months for as long as you are taking PrEP.  PREGNANCY   If you are premenopausal and you may become pregnant, ask your health care provider about preconception counseling.  If you may become pregnant, take 400 to 800 micrograms (mcg) of folic acid every day.  If you want to prevent pregnancy, talk to your health care provider about birth control (contraception). OSTEOPOROSIS AND MENOPAUSE   Osteoporosis is a disease in which the bones lose minerals and strength with aging. This can result in serious bone fractures. Your risk for osteoporosis can be identified using a bone density scan.  If you are 65 years of age or older, or if you are at risk for osteoporosis and fractures, ask your health care provider if you should be screened.  Ask your health care provider whether you should take a calcium or vitamin D supplement to lower your risk for osteoporosis.  Menopause may have certain physical symptoms and risks.  Hormone replacement therapy may reduce some of these symptoms and risks. Talk to your health care provider about whether hormone replacement therapy is right for you.  HOME CARE INSTRUCTIONS   Schedule regular health, dental, and eye  exams.  Stay current with your immunizations.   Do not use any tobacco products including cigarettes, chewing tobacco, or electronic cigarettes.  If you are pregnant, do not drink alcohol.  If you are breastfeeding, limit how much and how often you drink alcohol.  Limit alcohol intake to no more than 1 drink per day for nonpregnant women. One drink equals 12 ounces of beer, 5 ounces of wine, or 1 ounces of hard liquor.  Do not use street drugs.  Do not share needles.  Ask your health care provider for help if you need support or information about quitting drugs.  Tell your health care provider if you often feel depressed.  Tell your health care provider if you have ever been abused or do not feel safe at home. Document Released: 12/23/2010 Document Revised: 10/24/2013 Document Reviewed: 05/11/2013 [employer] Patient Information 2015 [employer]. This information is not intended to replace advice given to you by your health care provider. Make sure you discuss any questions you have with your health care provider.   

## 2014-10-27 NOTE — Progress Notes (Signed)
Subjective:    Patient ID: Shannon Cervantes, female    DOB: 02-15-1971, 44 y.o.   MRN: [id number]  Chief Complaint  Patient presents with  . Annual Exam  . Establish Care    HPI:  [name] is a 44 y.o. female who presents today for an annual wellness visit.   1) Health Maintenance -   Diet -  Averages about 2 meals per day consisting of chicken, vegetables and some fruit; Caffeine intake about 1-2 cups per day.  Exercise - No structured program presently sometimes 3-4 times per week.    2) Preventative Exams / Immunizations:  Dental -- Due for exam  Vision -- Due for exam    Health Maintenance  Topic Date Due  . HIV Screening  06/26/1986  . INFLUENZA VACCINE  01/22/2015  . PAP SMEAR  12/07/2016  . TETANUS/TDAP  09/28/2017     Immunization History  Administered Date(s) Administered  . Influenza,inj,Quad PF,36+ Mos 03/21/2014    Allergies  Allergen Reactions  . Suboxone [Buprenorphine Hcl-Naloxone Hcl] Shortness Of Breath    dizziness     Current Outpatient Prescriptions on File Prior to Visit  Medication Sig Dispense Refill  . acamprosate (CAMPRAL) 333 MG tablet Take 2 tablets (666 mg total) by mouth 3 (three) times daily with meals. For alcohol dependence 180 tablet 0  . estazolam (PROSOM) 2 MG tablet Take 1 tablet by mouth at bedtime as needed. sleep  2  . ibuprofen (ADVIL,MOTRIN) 200 MG tablet Take 800 mg by mouth every 6 (six) hours as needed for headache or moderate pain.    . medroxyPROGESTERone (DEPO-PROVERA) 150 MG/ML injection Inject 1 mL (150 mg total) into the muscle every 3 (three) months. For birth control 1 mL   . ondansetron (ZOFRAN ODT) 4 MG disintegrating tablet Take 1 tablet (4 mg total) by mouth every 8 (eight) hours as needed for nausea or vomiting. 20 tablet 0  . pantoprazole (PROTONIX) 40 MG tablet Take 1 tablet (40 mg total) by mouth daily. 30 tablet 1  . temazepam (RESTORIL) 30 MG capsule Take 1 capsule (30 mg total) by mouth at  bedtime as needed for sleep. 30 capsule 0  . valACYclovir (VALTREX) 500 MG tablet Take 1 tablet by mouth 2 (two) times daily as needed. outbreaks  3   No current facility-administered medications on file prior to visit.    Past Medical History  Diagnosis Date  . Foot fracture 01-07-2011  . Hypertensive cardiomyopathy   . Depression   . Hyperlipidemia   . Anxiety   . Alcohol abuse     Past Surgical History  Procedure Laterality Date  . Cesarean section    . Gastric bypass      Family History  Problem Relation Age of Onset  . Hypertension Mother   . Heart disease Mother   . Hyperlipidemia Mother   . Heart disease Father   . Alcohol abuse Brother   . Alcohol abuse Paternal Uncle     History   Social History  . Marital Status: Divorced    Spouse Name: N/A  . Number of Children: 4  . Years of Education: 16   Occupational History  . RN    Social History Main Topics  . Smoking status: Current Some Day Smoker -- 0.01 packs/day    Types: Cigarettes  . Smokeless tobacco: Never Used     Comment: 1 Cigarettes every other Day  . Alcohol Use: Yes     Comment:  occasionally  . Drug Use: No  . Sexual Activity: No   Other Topics Concern  . Not on file   Social History Narrative   Fun: Travel and shopping   Denies religious beliefs effecting healthcare.     Review of Systems  Constitutional: Denies fever, chills, fatigue, or significant weight gain/loss. HENT: Head: Denies headache or neck pain Ears: Denies changes in hearing, ringing in ears, earache, drainage Nose: Denies discharge, stuffiness, itching, nosebleed, sinus pain Throat: Denies sore throat, hoarseness, dry mouth, sores, thrush Eyes: Denies loss/changes in vision, pain, redness, blurry/double vision, flashing lights Cardiovascular: Denies chest pain/discomfort, tightness, palpitations, shortness of breath with activity, difficulty lying down, swelling, sudden awakening with shortness of  breath Respiratory: Denies shortness of breath, cough, sputum production, wheezing Gastrointestinal: Denies dysphasia, heartburn, change in appetite, nausea, change in bowel habits, rectal bleeding, constipation, diarrhea, yellow skin or eyes Genitourinary: Denies frequency, urgency, burning/pain, blood in urine, incontinence, change in urinary strength. Musculoskeletal: Denies muscle/joint pain, stiffness, back pain, redness or swelling of joints, trauma Skin: Denies rashes, lumps, itching, dryness, color changes, or hair/nail changes Neurological: Denies dizziness, fainting, seizures, weakness, numbness, tingling, tremor Psychiatric - Denies nervousness, stress, depression or memory loss Endocrine: Denies heat or cold intolerance, sweating, frequent urination, excessive thirst, changes in appetite Hematologic: Denies ease of bruising or bleeding     Objective:     BP 138/82 mmHg  Pulse 93  Temp(Src) 97.8 F (36.6 C)  Wt 168 lb 12 oz (76.544 kg)  SpO2 99% Nursing note and vital signs reviewed.  Physical Exam  Constitutional: She is oriented to person, place, and time. She appears well-developed and well-nourished.  HENT:  Head: Normocephalic.  Right Ear: Hearing, tympanic membrane, external ear and ear canal normal.  Left Ear: Hearing, tympanic membrane, external ear and ear canal normal.  Nose: Nose normal.  Mouth/Throat: Uvula is midline, oropharynx is clear and moist and mucous membranes are normal.  Eyes: Conjunctivae and EOM are normal. Pupils are equal, round, and reactive to light.  Neck: Neck supple. No JVD present. No tracheal deviation present. No thyromegaly present.  Cardiovascular: Normal rate, regular rhythm, normal heart sounds and intact distal pulses.   Pulmonary/Chest: Effort normal and breath sounds normal.  Abdominal: Soft. Bowel sounds are normal. She exhibits no distension and no mass. There is no tenderness. There is no rebound and no guarding.   Musculoskeletal: Normal range of motion. She exhibits no edema or tenderness.  Lymphadenopathy:    She has no cervical adenopathy.  Neurological: She is alert and oriented to person, place, and time. She has normal reflexes. No cranial nerve deficit. She exhibits normal muscle tone. Coordination normal.  Skin: Skin is warm and dry.  Psychiatric: She has a normal mood and affect. Her behavior is normal. Judgment and thought content normal.       Assessment & Plan:

## 2014-10-27 NOTE — Progress Notes (Signed)
Pre visit review using our clinic review tool, if applicable. No additional management support is needed unless otherwise documented below in the visit note. 

## 2014-10-27 NOTE — Assessment & Plan Note (Addendum)
1) Anticipatory Guidance: Discussed importance of wearing a seatbelt while driving and not texting while driving; changing batteries in smoke detector at least once annually; wearing suntan lotion when outside; eating a balanced and moderate diet; getting physical activity at least 30 minutes per day.  2) Immunizations / Screenings / Labs:  All immunizations are up to date per recommendations. She is due for dental and vision exam which she will schedule independently. All other screenings are up to date per recommendations. Obtain CBC, BMET, Lipid profile and TSH.   Overall well exam. Patient has minimal risk factors for cardiovascular disease. She does have increased anxiety levels which she used to take clonazepam for. She is currently on benzodiazepines for sleep. Limited supply of clonazepam given. She will discuss this with her counselor. Follow up prevention exam in 1 year. Follow up office visit in 1 month.

## 2014-10-31 ENCOUNTER — Telehealth: Payer: Self-pay | Admitting: Family

## 2014-10-31 ENCOUNTER — Other Ambulatory Visit (INDEPENDENT_AMBULATORY_CARE_PROVIDER_SITE_OTHER): Payer: 59

## 2014-10-31 DIAGNOSIS — Z0000 Encounter for general adult medical examination without abnormal findings: Secondary | ICD-10-CM

## 2014-10-31 DIAGNOSIS — R718 Other abnormality of red blood cells: Secondary | ICD-10-CM

## 2014-10-31 LAB — BASIC METABOLIC PANEL
BUN: 6 mg/dL (ref 6–23)
CO2: 27 mEq/L (ref 19–32)
Calcium: 8.8 mg/dL (ref 8.4–10.5)
Chloride: 105 mEq/L (ref 96–112)
Creatinine, Ser: 0.53 mg/dL (ref 0.40–1.20)
GFR: 161.65 mL/min (ref >60.00)
Glucose, Bld: 86 mg/dL (ref 70–99)
POTASSIUM: 3.1 meq/L — AB (ref 3.5–5.1)
SODIUM: 140 meq/L (ref 135–145)

## 2014-10-31 LAB — CBC
HCT: 35.8 % — ABNORMAL LOW (ref 36.0–46.0)
Hemoglobin: 11.8 g/dL — ABNORMAL LOW (ref 12.0–15.0)
MCHC: 33 g/dL (ref 30.0–36.0)
MCV: 89.7 fl (ref 78.0–100.0)
PLATELETS: 362 10*3/uL (ref 150.0–400.0)
RBC: 4 Mil/uL (ref 3.87–5.11)
RDW: 16.3 % — ABNORMAL HIGH (ref 11.5–15.5)
WBC: 5.8 10*3/uL (ref 4.0–10.5)

## 2014-10-31 LAB — LIPID PANEL
Cholesterol: 167 mg/dL (ref 0–200)
HDL: 86.9 mg/dL (ref >39.00)
LDL Cholesterol: 68 mg/dL (ref 0–99)
NonHDL: 80.1
TRIGLYCERIDES: 62 mg/dL (ref 0.0–149.0)
Total CHOL/HDL Ratio: 2
VLDL: 12.4 mg/dL (ref 0.0–40.0)

## 2014-10-31 LAB — TSH: TSH: 1.15 u[IU]/mL (ref 0.35–4.50)

## 2014-10-31 NOTE — Telephone Encounter (Signed)
Please inform the patient that her blood work shows that her kidney function, cholesterol and thyroid are normal. Her red blood cells are slightly small and her hemoglobin (oxygen carrying part of the red blood cell) is low indicating the potential for iron deficiency anemia. I have placed additional orders for her to check her iron at her convenience. This is not a fasting test and can be done at any part of the day. Also her potassium is slightly low which we will continue to monitor but can increase her intake in her diet through beans, leafy green vegetables, bananas, etc.

## 2014-10-31 NOTE — Telephone Encounter (Signed)
Pt aware of results 

## 2014-11-03 ENCOUNTER — Emergency Department (HOSPITAL_COMMUNITY): Payer: 59

## 2014-11-03 ENCOUNTER — Encounter (HOSPITAL_COMMUNITY): Payer: Self-pay | Admitting: Emergency Medicine

## 2014-11-03 ENCOUNTER — Emergency Department (HOSPITAL_COMMUNITY)
Admission: EM | Admit: 2014-11-03 | Discharge: 2014-11-03 | Disposition: A | Discharge status: Home / Self Care | Payer: 59 | Attending: Emergency Medicine | Admitting: Emergency Medicine

## 2014-11-03 DIAGNOSIS — Z8781 Personal history of (healed) traumatic fracture: Secondary | ICD-10-CM | POA: Insufficient documentation

## 2014-11-03 DIAGNOSIS — Z720 Tobacco use: Secondary | ICD-10-CM | POA: Insufficient documentation

## 2014-11-03 DIAGNOSIS — F419 Anxiety disorder, unspecified: Secondary | ICD-10-CM | POA: Insufficient documentation

## 2014-11-03 DIAGNOSIS — Z79899 Other long term (current) drug therapy: Secondary | ICD-10-CM | POA: Insufficient documentation

## 2014-11-03 DIAGNOSIS — K270 Acute peptic ulcer, site unspecified, with hemorrhage: Secondary | ICD-10-CM | POA: Diagnosis not present

## 2014-11-03 DIAGNOSIS — F329 Major depressive disorder, single episode, unspecified: Secondary | ICD-10-CM | POA: Insufficient documentation

## 2014-11-03 DIAGNOSIS — Z8639 Personal history of other endocrine, nutritional and metabolic disease: Secondary | ICD-10-CM | POA: Insufficient documentation

## 2014-11-03 DIAGNOSIS — Z8679 Personal history of other diseases of the circulatory system: Secondary | ICD-10-CM | POA: Insufficient documentation

## 2014-11-03 DIAGNOSIS — F1310 Sedative, hypnotic or anxiolytic abuse, uncomplicated: Secondary | ICD-10-CM | POA: Insufficient documentation

## 2014-11-03 DIAGNOSIS — R079 Chest pain, unspecified: Secondary | ICD-10-CM | POA: Diagnosis present

## 2014-11-03 DIAGNOSIS — K279 Peptic ulcer, site unspecified, unspecified as acute or chronic, without hemorrhage or perforation: Secondary | ICD-10-CM

## 2014-11-03 LAB — COMPREHENSIVE METABOLIC PANEL
ALT: 15 U/L (ref 14–54)
AST: 19 U/L (ref 15–41)
Albumin: 3.7 g/dL (ref 3.5–5.0)
Alkaline Phosphatase: 82 U/L (ref 38–126)
Anion gap: 10 (ref 5–15)
BUN: 8 mg/dL (ref 6–20)
CO2: 23 mmol/L (ref 22–32)
Calcium: 8.1 mg/dL — ABNORMAL LOW (ref 8.9–10.3)
Chloride: 108 mmol/L (ref 101–111)
Creatinine, Ser: 0.47 mg/dL (ref 0.44–1.00)
GFR calc Af Amer: >60 mL/min (ref >60)
GFR calc non Af Amer: >60 mL/min (ref >60)
Glucose, Bld: 85 mg/dL (ref 65–99)
Potassium: 3.2 mmol/L — ABNORMAL LOW (ref 3.5–5.1)
Sodium: 141 mmol/L (ref 135–145)
Total Bilirubin: 0.4 mg/dL (ref 0.3–1.2)
Total Protein: 7 g/dL (ref 6.5–8.1)

## 2014-11-03 LAB — CBC WITH DIFFERENTIAL/PLATELET
Basophils Absolute: 0.1 10*3/uL (ref 0.0–0.1)
Basophils Relative: 1 % (ref 0–1)
Eosinophils Absolute: 0.5 10*3/uL (ref 0.0–0.7)
Eosinophils Relative: 6 % — ABNORMAL HIGH (ref 0–5)
HCT: 32.2 % — ABNORMAL LOW (ref 36.0–46.0)
Hemoglobin: 10.6 g/dL — ABNORMAL LOW (ref 12.0–15.0)
LYMPHS PCT: 26 % (ref 12–46)
Lymphs Abs: 2.1 10*3/uL (ref 0.7–4.0)
MCH: 29.6 pg (ref 26.0–34.0)
MCHC: 32.9 g/dL (ref 30.0–36.0)
MCV: 89.9 fL (ref 78.0–100.0)
MONO ABS: 0.5 10*3/uL (ref 0.1–1.0)
Monocytes Relative: 6 % (ref 3–12)
NEUTROS ABS: 5.1 10*3/uL (ref 1.7–7.7)
Neutrophils Relative %: 61 % (ref 43–77)
PLATELETS: 313 10*3/uL (ref 150–400)
RBC: 3.58 MIL/uL — ABNORMAL LOW (ref 3.87–5.11)
RDW: 14.8 % (ref 11.5–15.5)
WBC: 8.3 10*3/uL (ref 4.0–10.5)

## 2014-11-03 LAB — I-STAT CHEM 8, ED
BUN: 6 mg/dL (ref 6–20)
CALCIUM ION: 1.12 mmol/L (ref 1.12–1.23)
Chloride: 107 mmol/L (ref 101–111)
Creatinine, Ser: 0.5 mg/dL (ref 0.44–1.00)
GLUCOSE: 82 mg/dL (ref 65–99)
HCT: 36 % (ref 36.0–46.0)
HEMOGLOBIN: 12.2 g/dL (ref 12.0–15.0)
POTASSIUM: 3.1 mmol/L — AB (ref 3.5–5.1)
Sodium: 144 mmol/L (ref 135–145)
TCO2: 21 mmol/L (ref 0–100)

## 2014-11-03 LAB — LIPASE, BLOOD: Lipase: 18 U/L — ABNORMAL LOW (ref 22–51)

## 2014-11-03 LAB — RAPID URINE DRUG SCREEN, HOSP PERFORMED
Amphetamines: NOT DETECTED
Barbiturates: NOT DETECTED
Benzodiazepines: POSITIVE — AB
Cocaine: NOT DETECTED
Opiates: NOT DETECTED
Tetrahydrocannabinol: NOT DETECTED

## 2014-11-03 LAB — I-STAT BETA HCG BLOOD, ED (MC, WL, AP ONLY): I-stat hCG, quantitative: <5 m[IU]/mL (ref <5)

## 2014-11-03 LAB — I-STAT TROPONIN, ED: TROPONIN I, POC: 0 ng/mL (ref 0.00–0.08)

## 2014-11-03 MED ORDER — SUCRALFATE 1 G PO TABS: 1.0000 g | ORAL_TABLET | Freq: Three times a day (TID) | ORAL | Status: DC

## 2014-11-03 MED ORDER — GI COCKTAIL ~~LOC~~
30.0000 mL | Freq: Once | ORAL | Status: AC
  Administered 2014-11-03: 30 mL via ORAL
  Filled 2014-11-03: qty 30

## 2014-11-03 NOTE — Discharge Instructions (Signed)
Peptic Ulcer A peptic ulcer is a sore in the lining of your esophagus (esophageal ulcer), stomach (gastric ulcer), or in the first part of your small intestine (duodenal ulcer). The ulcer causes erosion into the deeper tissue. CAUSES  Normally, the lining of the stomach and the small intestine protects itself from the acid that digests food. The protective lining can be damaged by:  An infection caused by a bacterium called Helicobacter pylori (H. pylori).  Regular use of nonsteroidal anti-inflammatory drugs (NSAIDs), such as ibuprofen or aspirin.  Smoking tobacco. Other risk factors include being older than 9, drinking alcohol excessively, and having a family history of ulcer disease.  SYMPTOMS   Burning pain or gnawing in the area between the chest and the belly button.  Heartburn.  Nausea and vomiting.  Bloating. The pain can be worse on an empty stomach and at night. If the ulcer results in bleeding, it can cause:  Black, tarry stools.  Vomiting of bright red blood.  Vomiting of coffee-ground-looking materials. DIAGNOSIS  A diagnosis is usually made based upon your history and an exam. Other tests and procedures may be performed to find the cause of the ulcer. Finding a cause will help determine the best treatment. Tests and procedures may include:  Blood tests, stool tests, or breath tests to check for the bacterium H. pylori.  An upper gastrointestinal (GI) series of the esophagus, stomach, and small intestine.  An endoscopy to examine the esophagus, stomach, and small intestine.  A biopsy. TREATMENT  Treatment may include:  Eliminating the cause of the ulcer, such as smoking, NSAIDs, or alcohol.  Medicines to reduce the amount of acid in your digestive tract.  Antibiotic medicines if the ulcer is caused by the H. pylori bacterium.  An upper endoscopy to treat a bleeding ulcer.  Surgery if the bleeding is severe or if the ulcer created a hole somewhere in the  digestive system. HOME CARE INSTRUCTIONS   Avoid tobacco, alcohol, and caffeine. Smoking can increase the acid in the stomach, and continued smoking will impair the healing of ulcers.  Avoid foods and drinks that seem to cause discomfort or aggravate your ulcer.  Only take medicines as directed by your caregiver. Do not substitute over-the-counter medicines for prescription medicines without talking to your caregiver.  Keep any follow-up appointments and tests as directed. SEEK MEDICAL CARE IF:   Your do not improve within 7 days of starting treatment.  You have ongoing indigestion or heartburn. SEEK IMMEDIATE MEDICAL CARE IF:   You have sudden, sharp, or persistent abdominal pain.  You have bloody or dark black, tarry stools.  You vomit blood or vomit that looks like coffee grounds.  You become light-headed, weak, or feel faint.  You become sweaty or clammy. MAKE SURE YOU:   Understand these instructions.  Will watch your condition.  Will get help right away if you are not doing well or get worse. Document Released: 06/06/2000 Document Revised: 10/24/2013 Document Reviewed: 01/07/2012 [facility] Patient Information 2015 [place], [place]. This information is not intended to replace advice given to you by your health care provider. Make sure you discuss any questions you have with your health care provider.  Please monitor for new or worsening signs or symptoms, return to the emergency room for further evaluation if any present. Please follow-up with your primary care provider and GI specialist. Please discontinue using alcohol and ibuprofen.

## 2014-11-03 NOTE — ED Notes (Signed)
PA at bedside.

## 2014-11-03 NOTE — ED Provider Notes (Signed)
CSN: [id number]     Arrival date & time 11/03/14  0604 History   First MD Initiated Contact with Patient 11/03/14 [phone]     Chief Complaint  Patient presents with  . Chest Pain   HPI   25 YOF presents today with CP that started 14 hours ago. Pt reports that she was feeling "ill" throughout the day and started to develop epigastric and substernal pain that evening. She describes it as "gnawing" and pressure. She reports associated nausea and radiation of pain into her back. Denies vomiting, SOB, diaphoresis, palpitations, tachypnea. Patient reports she has significant history of alcoholism, started drinking again approximately week ago having 5-6 shots a day additionally no she's been using ibuprofen 600 mg 3 times a day. Patient reports she has a history of GERD for she's taking Protonix.  Past Medical History  Diagnosis Date  . Foot fracture 01-07-2011  . Hypertensive cardiomyopathy   . Depression   . Hyperlipidemia   . Anxiety   . Alcohol abuse    Past Surgical History  Procedure Laterality Date  . Cesarean section    . Gastric bypass     Family History  Problem Relation Age of Onset  . Hypertension Mother   . Heart disease Mother   . Hyperlipidemia Mother   . Heart disease Father   . Alcohol abuse Brother   . Alcohol abuse Paternal Uncle    History  Substance Use Topics  . Smoking status: Current Some Day Smoker -- 0.01 packs/day    Types: Cigarettes  . Smokeless tobacco: Never Used     Comment: 1 Cigarettes every other Day  . Alcohol Use: Yes     Comment: occasionally   OB History    No data available     Review of Systems  All other systems reviewed and are negative.   Allergies  Suboxone  Home Medications   Prior to Admission medications   Medication Sig Start Date End Date Taking? Authorizing Provider  acamprosate (CAMPRAL) 333 MG tablet Take 2 tablets (666 mg total) by mouth 3 (three) times daily with meals. For alcohol dependence 04/07/14  Yes Dara Hoyer, PA-C  buPROPion (WELLBUTRIN XL) 300 MG 24 hr tablet Take 300 mg by mouth daily.   Yes Historical Provider, MD  clonazePAM (KLONOPIN) 0.5 MG tablet Take 1 tablet (0.5 mg total) by mouth daily. 10/27/14  Yes Golden Circle, FNP  estazolam (PROSOM) 2 MG tablet Take 2 mg by mouth at bedtime as needed (for sleep). sleep 08/30/14  Yes Historical Provider, MD  ibuprofen (ADVIL,MOTRIN) 200 MG tablet Take 800 mg by mouth every 6 (six) hours as needed for headache or moderate pain.   Yes Historical Provider, MD  medroxyPROGESTERone (DEPO-PROVERA) 150 MG/ML injection Inject 1 mL (150 mg total) into the muscle every 3 (three) months. For birth control 03/20/14  Yes Encarnacion Slates, NP  ondansetron (ZOFRAN ODT) 4 MG disintegrating tablet Take 1 tablet (4 mg total) by mouth every 8 (eight) hours as needed for nausea or vomiting. 10/27/14  Yes Golden Circle, FNP  pantoprazole (PROTONIX) 40 MG tablet Take 1 tablet (40 mg total) by mouth daily. 08/24/14  Yes Kristen N Ward, DO  valACYclovir (VALTREX) 500 MG tablet Take 1 tablet by mouth 2 (two) times daily as needed. outbreaks 08/29/14  Yes Historical Provider, MD  temazepam (RESTORIL) 30 MG capsule Take 1 capsule (30 mg total) by mouth at bedtime as needed for sleep. Patient not taking: Reported on  11/03/2014 08/24/14   Kristen N Ward, DO   Pulse 73  Temp(Src) 98.7 F (37.1 C) (Oral)  Resp 18  SpO2 97% Physical Exam  Constitutional: She is oriented to person, place, and time. She appears well-developed and well-nourished.  HENT:  Head: Normocephalic and atraumatic.  Eyes: Pupils are equal, round, and reactive to light.  Neck: Normal range of motion. Neck supple. No JVD present. No tracheal deviation present. No thyromegaly present.  Cardiovascular: Normal rate, regular rhythm, normal heart sounds and intact distal pulses.  Exam reveals no gallop and no friction rub.   No murmur heard. Pulmonary/Chest: Effort normal and breath sounds normal. No stridor. No  respiratory distress. She has no wheezes. She has no rales. She exhibits no tenderness.  Abdominal: Soft. She exhibits no distension. There is no hepatosplenomegaly. There is no rigidity, no rebound, no guarding, no CVA tenderness, no tenderness at McBurney's point and negative Murphy's sign.  Mild epigastric tenderness  Musculoskeletal: Normal range of motion.  Lymphadenopathy:    She has no cervical adenopathy.  Neurological: She is alert and oriented to person, place, and time. Coordination normal.  Skin: Skin is warm and dry.  Psychiatric: She has a normal mood and affect. Her behavior is normal. Judgment and thought content normal.  Nursing note and vitals reviewed.   ED Course  Procedures (including critical care time) Labs Review Labs Reviewed  CBC WITH DIFFERENTIAL/PLATELET - Abnormal; Notable for the following:    RBC 3.58 (*)    Hemoglobin 10.6 (*)    HCT 32.2 (*)    Eosinophils Relative 6 (*)    All other components within normal limits  I-STAT CHEM 8, ED - Abnormal; Notable for the following:    Potassium 3.1 (*)    All other components within normal limits  URINE RAPID DRUG SCREEN (HOSP PERFORMED)  I-STAT TROPOININ, ED  I-STAT BETA HCG BLOOD, ED ([facility], [facility], [facility] ONLY)    Imaging Review Dg Chest 2 View  11/03/2014   CLINICAL DATA:  New onset Mid chest pain yesterday.  EXAM: CHEST  2 VIEW  COMPARISON:  09/12/2014  FINDINGS: The heart size and mediastinal contours are within normal limits. Both lungs are clear. The visualized skeletal structures are unremarkable.  IMPRESSION: No active cardiopulmonary disease.   Electronically Signed   By: Lucienne Capers M.D.   On: 11/03/2014 06:42     EKG Interpretation   Date/Time:  Friday Nov 03 2014 06:16:45 EDT Ventricular Rate:  76 PR Interval:  131 QRS Duration: 83 QT Interval:  423 QTC Calculation: 476 R Axis:   46 Text Interpretation:  Sinus rhythm Abnormal R-wave progression, early  transition Confirmed by [facility]   MD, APRIL ([id number]) on 11/03/2014  6:44:05 AM      MDM   Final diagnoses:  PUD (peptic ulcer disease)    Labs: I-STAT Chem-8, i-STAT troponin, i-STAT beta hCG, drug screen, CBC, CMP, lipase no significant findings- hyperkalemia  Imaging: DG chest no active cardiopulmonary disease  Consults: None  Therapeutics: GI cocktail  Assessment: PUD  Plan: Patient presents with epigastric pain approximately 14 hours in duration. Patient is a alcoholic who is recently relapsed drinking for the past week, also been using ibuprofen therapy for menstrual related cramping. Patient has a history of GERD for she's taking Protonix. Patient is perk negative, his heart score of 1. Laboratory results, EKG, DG chest make this unlikely for cardiopulmonary etiology. Patient was given a GI cocktail with improvement in her epigastric symptoms. She was  instructed to continue taking her Protonix at home, avoid alcohol and ibuprofen use, follow-up with primary care provider, and also given a GI contact information. Patient was encouraged to follow up with both primary care and GI further evaluation and management of her PUD. Patient was given strict return precautions the event that symptoms continue to persist or worsen. Patient verbalized understanding today's plan and agreed to follow-up evaluation.      Okey Regal, PA-C 11/03/14 1735  Veatrice Kells, MD 11/03/14 2348

## 2014-11-03 NOTE — ED Notes (Signed)
Patient presents for centralized CP onset 0200, described as "pressure". Pt c/o nausea, unrelieved by zofran at home, dizziness. Denies vomiting and SOB at this time. Pt reports ETOH abuse, last drink 1800 yesterday.

## 2014-11-05 ENCOUNTER — Encounter (HOSPITAL_COMMUNITY): Payer: Self-pay | Admitting: *Deleted

## 2014-11-05 ENCOUNTER — Emergency Department (HOSPITAL_COMMUNITY)
Admission: EM | Admit: 2014-11-05 | Discharge: 2014-11-05 | Disposition: A | Discharge status: Home / Self Care | Payer: 59 | Attending: Emergency Medicine | Admitting: Emergency Medicine

## 2014-11-05 DIAGNOSIS — Z8639 Personal history of other endocrine, nutritional and metabolic disease: Secondary | ICD-10-CM | POA: Insufficient documentation

## 2014-11-05 DIAGNOSIS — Z720 Tobacco use: Secondary | ICD-10-CM | POA: Diagnosis not present

## 2014-11-05 DIAGNOSIS — Z79899 Other long term (current) drug therapy: Secondary | ICD-10-CM | POA: Insufficient documentation

## 2014-11-05 DIAGNOSIS — F1010 Alcohol abuse, uncomplicated: Secondary | ICD-10-CM | POA: Diagnosis not present

## 2014-11-05 DIAGNOSIS — Z8781 Personal history of (healed) traumatic fracture: Secondary | ICD-10-CM | POA: Diagnosis not present

## 2014-11-05 DIAGNOSIS — F410 Panic disorder [episodic paroxysmal anxiety] without agoraphobia: Secondary | ICD-10-CM | POA: Insufficient documentation

## 2014-11-05 DIAGNOSIS — I429 Cardiomyopathy, unspecified: Secondary | ICD-10-CM | POA: Diagnosis not present

## 2014-11-05 DIAGNOSIS — F329 Major depressive disorder, single episode, unspecified: Secondary | ICD-10-CM | POA: Insufficient documentation

## 2014-11-05 DIAGNOSIS — I119 Hypertensive heart disease without heart failure: Secondary | ICD-10-CM | POA: Insufficient documentation

## 2014-11-05 DIAGNOSIS — F430 Acute stress reaction: Secondary | ICD-10-CM

## 2014-11-05 MED ORDER — CHLORDIAZEPOXIDE HCL 25 MG PO CAPS: ORAL_CAPSULE | ORAL | Status: DC

## 2014-11-05 MED ORDER — ACETAMINOPHEN 325 MG PO TABS
650.0000 mg | ORAL_TABLET | Freq: Once | ORAL | Status: AC
  Administered 2014-11-05: 650 mg via ORAL
  Filled 2014-11-05: qty 2

## 2014-11-05 NOTE — ED Notes (Signed)
Pt denies SI/HI,  States she had been drinking all day,  Then got anxious and took two klonopin then felt "bad" pt is alert and oriented in NAD

## 2014-11-05 NOTE — ED Notes (Signed)
Pt given ginger ale per Power PA

## 2014-11-05 NOTE — ED Notes (Signed)
Pt received Zofran 4 mg IV enroute for nausea

## 2014-11-05 NOTE — Discharge Instructions (Signed)
Chemical Dependency Chemical dependency is an addiction to drugs or alcohol. It is characterized by the repeated behavior of seeking out and using drugs and alcohol despite harmful consequences to the health and safety of ones self and others.  RISK FACTORS There are certain situations or behaviors that increase a person's risk for chemical dependency. These include:  A family history of chemical dependency.  A history of mental health issues, including depression and anxiety.  A home environment where drugs and alcohol are easily available to you.  Drug or alcohol use at a young age. SYMPTOMS  The following symptoms can indicate chemical dependency:  Inability to limit the use of drugs or alcohol.  Nausea, sweating, shakiness, and anxiety that occurs when alcohol or drugs are not being used.  An increase in amount of drugs or alcohol that is necessary to get drunk or high. People who experience these symptoms can assess their use of drugs and alcohol by asking themselves the following questions:  Have you been told by friends or family that they are worried about your use of alcohol or drugs?  Do friends and family ever tell you about things you did while drinking alcohol or using drugs that you do not remember?  Do you lie about using alcohol or drugs or about the amounts you use?  Do you have difficulty completing daily tasks unless you use alcohol or drugs?  Is the level of your work or school performance lower because of your drug or alcohol use?  Do you get sick from using drugs or alcohol but keep using anyway?  Do you feel uncomfortable in social situations unless you use alcohol or drugs?  Do you use drugs or alcohol to help forget problems? An answer of yes to any of these questions may indicate chemical dependency. Professional evaluation is suggested. Document Released: 06/03/2001 Document Revised: 09/01/2011 Document Reviewed: 08/15/2010 [facility] Patient  Information 2015 [place], [place]. This information is not intended to replace advice given to you by your health care provider. Make sure you discuss any questions you have with your health care provider.

## 2014-11-05 NOTE — ED Notes (Signed)
Bed: RU04 Expected date:  Expected time:  Means of arrival:  Comments: EMS ETOH / Klonopin use

## 2014-11-05 NOTE — ED Provider Notes (Signed)
CSN: [id number]     Arrival date & time 11/05/14  4008 History   First MD Initiated Contact with Patient 11/05/14 [phone]     Chief Complaint  Patient presents with  . Alcohol Intoxication     (Consider location/radiation/quality/duration/timing/severity/associated sxs/prior Treatment) HPI   44 year old female with a significant history of alcohol abuse brought here via EMS for evaluation of a panic attack. Patient admits that she has history of alcohol abuse. She is relapsing and admits to drinking half a pint of liquor since yesterday. This morning she became depressed, anxious and frustrated about alcohol abuse and felt that it is getting out of control.  She also took Klonopin to help with her anxiety but it became intense, prompting her to contact EMS.  She did felt nauseous and vomit a few times PTA but now felt better after receiving Zofran.  She currently denies SI/HI.  Admits occasional hallucination when she's drunk.  Denies having any pain.  She was seen in ER on 5/13 with CP which thoughts to be related to PUD due to alcohol abuse and NSAIDs use.  Cardiac work up non contributory.  She denies any active CP at this time.  She has an appointment with her counselor tomorrow for help with her alcohol abuse . Currently denies headache, cp, sob, abd pain, back pain, numbness or weakness.    Past Medical History  Diagnosis Date  . Foot fracture 01-07-2011  . Hypertensive cardiomyopathy   . Depression   . Hyperlipidemia   . Anxiety   . Alcohol abuse    Past Surgical History  Procedure Laterality Date  . Cesarean section    . Gastric bypass     Family History  Problem Relation Age of Onset  . Hypertension Mother   . Heart disease Mother   . Hyperlipidemia Mother   . Heart disease Father   . Alcohol abuse Brother   . Alcohol abuse Paternal Uncle    History  Substance Use Topics  . Smoking status: Current Some Day Smoker -- 0.01 packs/day    Types: Cigarettes  . Smokeless  tobacco: Never Used     Comment: 1 Cigarettes every other Day  . Alcohol Use: Yes     Comment: occasionally   OB History    No data available     Review of Systems  All other systems reviewed and are negative.     Allergies  Suboxone  Home Medications   Prior to Admission medications   Medication Sig Start Date End Date Taking? Authorizing Provider  acamprosate (CAMPRAL) 333 MG tablet Take 2 tablets (666 mg total) by mouth 3 (three) times daily with meals. For alcohol dependence 04/07/14   Dara Hoyer, PA-C  buPROPion (WELLBUTRIN XL) 300 MG 24 hr tablet Take 300 mg by mouth daily.    Historical Provider, MD  clonazePAM (KLONOPIN) 0.5 MG tablet Take 1 tablet (0.5 mg total) by mouth daily. 10/27/14   Golden Circle, FNP  estazolam (PROSOM) 2 MG tablet Take 2 mg by mouth at bedtime as needed (for sleep). sleep 08/30/14   Historical Provider, MD  ibuprofen (ADVIL,MOTRIN) 200 MG tablet Take 800 mg by mouth every 6 (six) hours as needed for headache or moderate pain.    Historical Provider, MD  medroxyPROGESTERone (DEPO-PROVERA) 150 MG/ML injection Inject 1 mL (150 mg total) into the muscle every 3 (three) months. For birth control 03/20/14   Encarnacion Slates, NP  ondansetron (ZOFRAN ODT) 4 MG disintegrating tablet Take  1 tablet (4 mg total) by mouth every 8 (eight) hours as needed for nausea or vomiting. 10/27/14   Golden Circle, FNP  pantoprazole (PROTONIX) 40 MG tablet Take 1 tablet (40 mg total) by mouth daily. 08/24/14   Kristen N Ward, DO  sucralfate (CARAFATE) 1 G tablet Take 1 tablet (1 g total) by mouth 4 (four) times daily -  with meals and at bedtime. 11/03/14   Dellis Filbert Hedges, PA-C  temazepam (RESTORIL) 30 MG capsule Take 1 capsule (30 mg total) by mouth at bedtime as needed for sleep. Patient not taking: Reported on 11/03/2014 08/24/14   Delice Bison Ward, DO  valACYclovir (VALTREX) 500 MG tablet Take 1 tablet by mouth 2 (two) times daily as needed. outbreaks 08/29/14   Historical  Provider, MD   BP 112/75 mmHg  Pulse 81  Temp(Src) 98.7 F (37.1 C) (Oral)  Resp 16  SpO2 97% Physical Exam  Constitutional: She is oriented to person, place, and time. She appears well-developed and well-nourished. No distress.  HENT:  Head: Atraumatic.  Mouth/Throat: Oropharynx is clear and moist.  Eyes: Conjunctivae are normal.  Neck: Neck supple.  Cardiovascular: Normal rate and regular rhythm.   Pulmonary/Chest: Effort normal and breath sounds normal.  Abdominal: Soft.  Neurological: She is alert and oriented to person, place, and time. She has normal strength. No cranial nerve deficit or sensory deficit. Coordination and gait normal. GCS eye subscore is 4. GCS verbal subscore is 5. GCS motor subscore is 6.  Skin: No rash noted.  Psychiatric: She has a normal mood and affect. Her speech is normal. She is withdrawn. Thought content is not paranoid. She expresses no homicidal and no suicidal ideation.  Patient is calm and cooperative.  Nursing note and vitals reviewed.   ED Course  Procedures (including critical care time)  Patient with history of alcohol abuse, relapse on alcohol use. She also use Klonopin and sleeping medication this morning due to having a bouts of anxiety attack. She is now back to her baseline. She has no focal neuro deficit and in no acute distress at this time. She is afebrile, vital signs stable.  I discussed the risk of overdose including death with a combination use of alcohol, benzos, and sleep medication. Since patient has close follow-up tomorrow, I feel the patient is stable for discharge given that she is not actively SI/HI.  She is mentating at her baseline, difficulty ambulating and able to make informed decision.  7:29 AM Pt will be discharge with Librium to help with alcohol cravings.  Pt no longer abuse opiates and has used Librium with some success in the past.  Pregnancy test 2 days ago is negative, labs are reassuring.    Labs Review Labs  Reviewed - No data to display  Imaging Review Dg Chest 2 View  11/03/2014   CLINICAL DATA:  New onset Mid chest pain yesterday.  EXAM: CHEST  2 VIEW  COMPARISON:  09/12/2014  FINDINGS: The heart size and mediastinal contours are within normal limits. Both lungs are clear. The visualized skeletal structures are unremarkable.  IMPRESSION: No active cardiopulmonary disease.   Electronically Signed   By: Lucienne Capers M.D.   On: 11/03/2014 06:42     EKG Interpretation None      MDM   Final diagnoses:  Panic attack as reaction to stress  Alcohol abuse    BP 112/75 mmHg  Pulse 81  Temp(Src) 98.7 F (37.1 C) (Oral)  Resp 16  SpO2 97%  Domenic Moras, PA-C 11/05/14 [id number]  Linton Flemings, MD 11/05/14 2107

## 2014-11-10 ENCOUNTER — Ambulatory Visit: Payer: 59 | Admitting: Family

## 2014-11-10 DIAGNOSIS — Z0289 Encounter for other administrative examinations: Secondary | ICD-10-CM

## 2014-11-12 ENCOUNTER — Encounter (HOSPITAL_COMMUNITY): Payer: Self-pay | Admitting: Behavioral Health

## 2014-11-12 ENCOUNTER — Inpatient Hospital Stay (HOSPITAL_COMMUNITY)
Admission: AD | Admit: 2014-11-12 | Discharge: 2014-11-17 | DRG: 897 | Disposition: A | Discharge status: Home / Self Care | Payer: 59 | Source: Intra-hospital | Attending: Psychiatry | Admitting: Psychiatry

## 2014-11-12 ENCOUNTER — Emergency Department (HOSPITAL_COMMUNITY)
Admission: EM | Admit: 2014-11-12 | Discharge: 2014-11-12 | Disposition: A | Discharge status: Other Acute Inpatient Hospital | Payer: 59 | Attending: Emergency Medicine | Admitting: Emergency Medicine

## 2014-11-12 ENCOUNTER — Encounter (HOSPITAL_COMMUNITY): Payer: Self-pay | Admitting: Emergency Medicine

## 2014-11-12 DIAGNOSIS — F10239 Alcohol dependence with withdrawal, unspecified: Principal | ICD-10-CM | POA: Diagnosis present

## 2014-11-12 DIAGNOSIS — K219 Gastro-esophageal reflux disease without esophagitis: Secondary | ICD-10-CM | POA: Diagnosis present

## 2014-11-12 DIAGNOSIS — Z8781 Personal history of (healed) traumatic fracture: Secondary | ICD-10-CM | POA: Diagnosis not present

## 2014-11-12 DIAGNOSIS — Z87891 Personal history of nicotine dependence: Secondary | ICD-10-CM

## 2014-11-12 DIAGNOSIS — Z79899 Other long term (current) drug therapy: Secondary | ICD-10-CM | POA: Insufficient documentation

## 2014-11-12 DIAGNOSIS — Z8639 Personal history of other endocrine, nutritional and metabolic disease: Secondary | ICD-10-CM | POA: Insufficient documentation

## 2014-11-12 DIAGNOSIS — F1994 Other psychoactive substance use, unspecified with psychoactive substance-induced mood disorder: Secondary | ICD-10-CM | POA: Diagnosis not present

## 2014-11-12 DIAGNOSIS — F1310 Sedative, hypnotic or anxiolytic abuse, uncomplicated: Secondary | ICD-10-CM | POA: Insufficient documentation

## 2014-11-12 DIAGNOSIS — F329 Major depressive disorder, single episode, unspecified: Secondary | ICD-10-CM | POA: Insufficient documentation

## 2014-11-12 DIAGNOSIS — Z9884 Bariatric surgery status: Secondary | ICD-10-CM | POA: Diagnosis not present

## 2014-11-12 DIAGNOSIS — Z3202 Encounter for pregnancy test, result negative: Secondary | ICD-10-CM | POA: Diagnosis not present

## 2014-11-12 DIAGNOSIS — F1924 Other psychoactive substance dependence with psychoactive substance-induced mood disorder: Secondary | ICD-10-CM | POA: Diagnosis not present

## 2014-11-12 DIAGNOSIS — F1024 Alcohol dependence with alcohol-induced mood disorder: Secondary | ICD-10-CM | POA: Diagnosis present

## 2014-11-12 DIAGNOSIS — I119 Hypertensive heart disease without heart failure: Secondary | ICD-10-CM | POA: Diagnosis not present

## 2014-11-12 DIAGNOSIS — F419 Anxiety disorder, unspecified: Secondary | ICD-10-CM | POA: Insufficient documentation

## 2014-11-12 DIAGNOSIS — F10230 Alcohol dependence with withdrawal, uncomplicated: Secondary | ICD-10-CM | POA: Insufficient documentation

## 2014-11-12 DIAGNOSIS — R45851 Suicidal ideations: Secondary | ICD-10-CM

## 2014-11-12 DIAGNOSIS — F332 Major depressive disorder, recurrent severe without psychotic features: Secondary | ICD-10-CM | POA: Diagnosis present

## 2014-11-12 DIAGNOSIS — F4310 Post-traumatic stress disorder, unspecified: Secondary | ICD-10-CM | POA: Diagnosis present

## 2014-11-12 DIAGNOSIS — F10129 Alcohol abuse with intoxication, unspecified: Secondary | ICD-10-CM | POA: Diagnosis present

## 2014-11-12 LAB — CBC WITH DIFFERENTIAL/PLATELET
Basophils Absolute: 0.1 10*3/uL (ref 0.0–0.1)
Basophils Relative: 1 % (ref 0–1)
EOS PCT: 2 % (ref 0–5)
Eosinophils Absolute: 0.2 10*3/uL (ref 0.0–0.7)
HCT: 34.3 % — ABNORMAL LOW (ref 36.0–46.0)
Hemoglobin: 11.4 g/dL — ABNORMAL LOW (ref 12.0–15.0)
Lymphocytes Relative: 30 % (ref 12–46)
Lymphs Abs: 2.6 10*3/uL (ref 0.7–4.0)
MCH: 30.1 pg (ref 26.0–34.0)
MCHC: 33.2 g/dL (ref 30.0–36.0)
MCV: 90.5 fL (ref 78.0–100.0)
Monocytes Absolute: 0.4 10*3/uL (ref 0.1–1.0)
Monocytes Relative: 4 % (ref 3–12)
NEUTROS PCT: 63 % (ref 43–77)
Neutro Abs: 5.4 10*3/uL (ref 1.7–7.7)
Platelets: 344 10*3/uL (ref 150–400)
RBC: 3.79 MIL/uL — ABNORMAL LOW (ref 3.87–5.11)
RDW: 15.6 % — ABNORMAL HIGH (ref 11.5–15.5)
WBC: 8.6 10*3/uL (ref 4.0–10.5)

## 2014-11-12 LAB — URINE MICROSCOPIC-ADD ON

## 2014-11-12 LAB — COMPREHENSIVE METABOLIC PANEL
ALBUMIN: 3.9 g/dL (ref 3.5–5.0)
ALT: 19 U/L (ref 14–54)
AST: 27 U/L (ref 15–41)
Alkaline Phosphatase: 88 U/L (ref 38–126)
Anion gap: 13 (ref 5–15)
BUN: 11 mg/dL (ref 6–20)
CHLORIDE: 105 mmol/L (ref 101–111)
CO2: 22 mmol/L (ref 22–32)
Calcium: 7.7 mg/dL — ABNORMAL LOW (ref 8.9–10.3)
Creatinine, Ser: 0.41 mg/dL — ABNORMAL LOW (ref 0.44–1.00)
GFR calc Af Amer: >60 mL/min (ref >60)
GFR calc non Af Amer: >60 mL/min (ref >60)
GLUCOSE: 79 mg/dL (ref 65–99)
Potassium: 3.5 mmol/L (ref 3.5–5.1)
Sodium: 140 mmol/L (ref 135–145)
Total Bilirubin: 0.5 mg/dL (ref 0.3–1.2)
Total Protein: 7.4 g/dL (ref 6.5–8.1)

## 2014-11-12 LAB — RAPID URINE DRUG SCREEN, HOSP PERFORMED
AMPHETAMINES: NOT DETECTED
BARBITURATES: NOT DETECTED
Benzodiazepines: POSITIVE — AB
COCAINE: NOT DETECTED
OPIATES: NOT DETECTED
Tetrahydrocannabinol: NOT DETECTED

## 2014-11-12 LAB — URINALYSIS, ROUTINE W REFLEX MICROSCOPIC
Bilirubin Urine: NEGATIVE
Glucose, UA: NEGATIVE mg/dL
Ketones, ur: 15 mg/dL — AB
LEUKOCYTES UA: NEGATIVE
Nitrite: NEGATIVE
PROTEIN: NEGATIVE mg/dL
SPECIFIC GRAVITY, URINE: 1.022 (ref 1.005–1.030)
UROBILINOGEN UA: 1 mg/dL (ref 0.0–1.0)
pH: 6.5 (ref 5.0–8.0)

## 2014-11-12 LAB — ETHANOL: ALCOHOL ETHYL (B): 230 mg/dL — AB (ref <5)

## 2014-11-12 LAB — PREGNANCY, URINE: PREG TEST UR: NEGATIVE

## 2014-11-12 MED ORDER — IBUPROFEN 200 MG PO TABS
600.0000 mg | ORAL_TABLET | Freq: Three times a day (TID) | ORAL | Status: DC | PRN
  Administered 2014-11-12: 600 mg via ORAL
  Filled 2014-11-12: qty 3

## 2014-11-12 MED ORDER — POTASSIUM CHLORIDE CRYS ER 20 MEQ PO TBCR
40.0000 meq | EXTENDED_RELEASE_TABLET | Freq: Every day | ORAL | Status: DC
  Administered 2014-11-12: 40 meq via ORAL
  Filled 2014-11-12: qty 2

## 2014-11-12 MED ORDER — LORAZEPAM 1 MG PO TABS
0.0000 mg | ORAL_TABLET | Freq: Four times a day (QID) | ORAL | Status: DC
  Administered 2014-11-12: 2 mg via ORAL
  Administered 2014-11-12: 1 mg via ORAL
  Filled 2014-11-12: qty 1
  Filled 2014-11-12: qty 2

## 2014-11-12 MED ORDER — VITAMIN B-1 100 MG PO TABS
100.0000 mg | ORAL_TABLET | Freq: Every day | ORAL | Status: DC
  Administered 2014-11-12: 100 mg via ORAL
  Filled 2014-11-12: qty 1

## 2014-11-12 MED ORDER — TRAZODONE HCL 100 MG PO TABS
100.0000 mg | ORAL_TABLET | Freq: Every evening | ORAL | Status: DC | PRN
  Administered 2014-11-12 – 2014-11-13 (×2): 100 mg via ORAL
  Filled 2014-11-12 (×2): qty 1

## 2014-11-12 MED ORDER — BUPROPION HCL ER (XL) 300 MG PO TB24
300.0000 mg | ORAL_TABLET | Freq: Every day | ORAL | Status: DC
  Filled 2014-11-12: qty 1

## 2014-11-12 MED ORDER — THIAMINE HCL 100 MG/ML IJ SOLN
100.0000 mg | Freq: Every day | INTRAMUSCULAR | Status: DC
Start: 2014-11-13 — End: 2014-11-13

## 2014-11-12 MED ORDER — VITAMIN B-1 100 MG PO TABS
100.0000 mg | ORAL_TABLET | Freq: Every day | ORAL | Status: DC
Start: 2014-11-13 — End: 2014-11-13
  Filled 2014-11-12 (×2): qty 1

## 2014-11-12 MED ORDER — NICOTINE 21 MG/24HR TD PT24
21.0000 mg | MEDICATED_PATCH | Freq: Every day | TRANSDERMAL | Status: DC
Start: 2014-11-13 — End: 2014-11-17
  Filled 2014-11-12 (×2): qty 1
  Filled 2014-11-12: qty 14
  Filled 2014-11-12 (×3): qty 1
  Filled 2014-11-12: qty 14
  Filled 2014-11-12: qty 1

## 2014-11-12 MED ORDER — LORAZEPAM 1 MG PO TABS: 0.0000 mg | ORAL_TABLET | Freq: Two times a day (BID) | ORAL | Status: DC

## 2014-11-12 MED ORDER — ZOLPIDEM TARTRATE 5 MG PO TABS: 5.0000 mg | ORAL_TABLET | Freq: Every evening | ORAL | Status: DC | PRN

## 2014-11-12 MED ORDER — ONDANSETRON HCL 4 MG PO TABS
4.0000 mg | ORAL_TABLET | Freq: Three times a day (TID) | ORAL | Status: DC | PRN
  Administered 2014-11-12 (×2): 4 mg via ORAL
  Filled 2014-11-12 (×2): qty 1

## 2014-11-12 MED ORDER — THIAMINE HCL 100 MG/ML IJ SOLN: 100.0000 mg | Freq: Every day | INTRAMUSCULAR | Status: DC

## 2014-11-12 MED ORDER — ONDANSETRON HCL 4 MG PO TABS
4.0000 mg | ORAL_TABLET | Freq: Three times a day (TID) | ORAL | Status: DC | PRN
  Administered 2014-11-12: 4 mg via ORAL
  Filled 2014-11-12: qty 1

## 2014-11-12 MED ORDER — ALUM & MAG HYDROXIDE-SIMETH 200-200-20 MG/5ML PO SUSP: 30.0000 mL | ORAL | Status: DC | PRN

## 2014-11-12 MED ORDER — MAGNESIUM HYDROXIDE 400 MG/5ML PO SUSP: 30.0000 mL | Freq: Every day | ORAL | Status: DC | PRN

## 2014-11-12 MED ORDER — ACETAMINOPHEN 325 MG PO TABS
650.0000 mg | ORAL_TABLET | Freq: Four times a day (QID) | ORAL | Status: DC | PRN
  Administered 2014-11-12 – 2014-11-15 (×3): 650 mg via ORAL
  Filled 2014-11-12 (×3): qty 2

## 2014-11-12 MED ORDER — BUPROPION HCL ER (XL) 300 MG PO TB24
300.0000 mg | ORAL_TABLET | Freq: Every day | ORAL | Status: DC
  Administered 2014-11-13: 300 mg via ORAL
  Filled 2014-11-12 (×2): qty 1

## 2014-11-12 MED ORDER — ACETAMINOPHEN 325 MG PO TABS: 650.0000 mg | ORAL_TABLET | ORAL | Status: DC | PRN

## 2014-11-12 MED ORDER — LORAZEPAM 1 MG PO TABS
0.0000 mg | ORAL_TABLET | Freq: Four times a day (QID) | ORAL | Status: DC
  Administered 2014-11-12: 1 mg via ORAL
  Administered 2014-11-12 – 2014-11-13 (×2): 2 mg via ORAL
  Filled 2014-11-12: qty 2
  Filled 2014-11-12: qty 1
  Filled 2014-11-12: qty 2

## 2014-11-12 MED ORDER — NICOTINE 21 MG/24HR TD PT24
21.0000 mg | MEDICATED_PATCH | Freq: Every day | TRANSDERMAL | Status: DC
  Filled 2014-11-12: qty 1

## 2014-11-12 MED ORDER — IBUPROFEN 600 MG PO TABS
600.0000 mg | ORAL_TABLET | Freq: Three times a day (TID) | ORAL | Status: DC | PRN
  Administered 2014-11-12 – 2014-11-17 (×9): 600 mg via ORAL
  Filled 2014-11-12 (×9): qty 1

## 2014-11-12 NOTE — [facility] Counselor (Signed)
Patient reviewed and signed voluntary consent to treat and release forms to transport to [facility]. Forms signed and given to the nurse.

## 2014-11-12 NOTE — ED Notes (Addendum)
TTS still at bedside, RN Jana Half notified. Will draw blood upon TTS's completion

## 2014-11-12 NOTE — ED Notes (Signed)
Brought in by EMS from home with c/o alcohol withdrawal.  Pt reported that she has been having some s/s alcohol withdrawal like "tremors, nausea, depression..." Pt denies SI but has stated that she does not "like to live this way anymore".  Pt has hx of alcohol addiction/abuse.  She was here last Sunday to seek help with alcohol detox--- was discharge with Librium and instruction for out-patient support.  She has taken a dose of Librium but had made her "ill" so she stopped taking it and started drinking alcohol instead--- reports having alcohol binge for a week, with her last drink at around 0400 this morning.  Pt feels depressed, has tremors and nausea at this time and decided to call EMS.

## 2014-11-12 NOTE — ED Notes (Signed)
Nausea has improved some, PO fluids encouraged, will take the wellbutrin shortly.

## 2014-11-12 NOTE — ED Notes (Signed)
Up to the bathroom 

## 2014-11-12 NOTE — ED Notes (Signed)
Pt provided warm blankets

## 2014-11-12 NOTE — ED Notes (Signed)
Bed: [facility] Expected date:  Expected time:  Means of arrival:  Comments: Hold for 23

## 2014-11-12 NOTE — ED Notes (Signed)
Bed: [facility] Expected date:  Expected time:  Means of arrival:  Comments: EMS 44 yo female from home/wants detox

## 2014-11-12 NOTE — ED Provider Notes (Signed)
CSN: [id number]     Arrival date & time 11/12/14  0998 History   First MD Initiated Contact with Patient 11/12/14 [phone]     Chief Complaint  Patient presents with  . Alcohol Intoxication     (Consider location/radiation/quality/duration/timing/severity/associated sxs/prior Treatment) HPI Shannon Cervantes is a 44 year-old female with pmhx of alcohol abuse, depression, anxiety who presents the ER complaining of alcohol withdrawal. Patient states that for the past several weeks she has been attempting to detox from alcohol. She states she came to the emergency room earlier this week, was discharged with a Librium taper. Patient states after taking one dose of Librium taper she became extremely drowsy, was "unable to function". She states she stopped taking Librium and continued to drink alcohol. Patient states this morning around 4:00 AM she started drinking again. She states she is attempting to stop drinking, and is experiencing tremors, nausea, anxiety. Patient denies active thoughts of wine to harm herself, however states she "does not want to live anymore". Patient endorses feelings of depression, states she attempted to follow up with her psychotherapist as an outpatient this week, however her alcoholism and depression was refractory to going, and she feels she needs inpatient treatment.  Past Medical History  Diagnosis Date  . Foot fracture 01-07-2011  . Hypertensive cardiomyopathy   . Depression   . Hyperlipidemia   . Anxiety   . Alcohol abuse    Past Surgical History  Procedure Laterality Date  . Cesarean section    . Gastric bypass     Family History  Problem Relation Age of Onset  . Hypertension Mother   . Heart disease Mother   . Hyperlipidemia Mother   . Heart disease Father   . Alcohol abuse Brother   . Alcohol abuse Paternal Uncle    History  Substance Use Topics  . Smoking status: Former Smoker -- 0.01 packs/day    Types: Cigarettes  . Smokeless tobacco: Never Used   Comment: 1 Cigarettes every other Day  . Alcohol Use: Yes     Comment: occasionally   OB History    No data available     Review of Systems  Constitutional: Negative for fever.  HENT: Negative for trouble swallowing.   Eyes: Negative for visual disturbance.  Respiratory: Negative for shortness of breath.   Cardiovascular: Negative for chest pain.  Gastrointestinal: Positive for nausea. Negative for vomiting and abdominal pain.  Genitourinary: Negative for dysuria.  Musculoskeletal: Negative for neck pain.  Skin: Negative for rash.  Neurological: Negative for dizziness, weakness and numbness.  Psychiatric/Behavioral: Positive for suicidal ideas and dysphoric mood. Negative for self-injury.      Allergies  Suboxone  Home Medications   Prior to Admission medications   Medication Sig Start Date End Date Taking? Authorizing Provider  buPROPion (WELLBUTRIN XL) 300 MG 24 hr tablet Take 300 mg by mouth daily.   Yes Historical Provider, MD  ibuprofen (ADVIL,MOTRIN) 200 MG tablet Take 800 mg by mouth every 6 (six) hours as needed for headache or moderate pain.   Yes Historical Provider, MD  medroxyPROGESTERone (DEPO-PROVERA) 150 MG/ML injection Inject 1 mL (150 mg total) into the muscle every 3 (three) months. For birth control 03/20/14  Yes Encarnacion Slates, NP  pantoprazole (PROTONIX) 40 MG tablet Take 1 tablet (40 mg total) by mouth daily. 08/24/14  Yes Kristen N Ward, DO  acamprosate (CAMPRAL) 333 MG tablet Take 2 tablets (666 mg total) by mouth 3 (three) times daily with meals. For alcohol  dependence Patient not taking: Reported on 11/12/2014 04/07/14   Dara Hoyer, PA-C  chlordiazePOXIDE (LIBRIUM) 25 MG capsule 50mg  PO TID x 1D, then 25-50mg  PO BID X 1D, then 25-50mg  PO QD X 1D Patient not taking: Reported on 11/12/2014 11/05/14   Domenic Moras, PA-C  propranolol (INDERAL) 10 MG tablet Take 10 mg by mouth 3 (three) times daily.  11/07/14   Historical Provider, MD  sucralfate (CARAFATE) 1 G  tablet Take 1 tablet (1 g total) by mouth 4 (four) times daily -  with meals and at bedtime. Patient not taking: Reported on 11/12/2014 11/03/14   Okey Regal, PA-C   BP 120/80 mmHg  Pulse 97  Temp(Src) 98.7 F (37.1 C) (Oral)  Resp 16  SpO2 98% Physical Exam  Constitutional: She is oriented to person, place, and time. She appears well-developed and well-nourished. No distress.  HENT:  Head: Normocephalic and atraumatic.  Mouth/Throat: Uvula is midline, oropharynx is clear and moist and mucous membranes are normal. No trismus in the jaw. No uvula swelling. No oropharyngeal exudate, posterior oropharyngeal edema, posterior oropharyngeal erythema or tonsillar abscesses.  Eyes: Right eye exhibits no discharge. Left eye exhibits no discharge. No scleral icterus.  Neck: Normal range of motion.  Cardiovascular: Normal rate, regular rhythm and normal heart sounds.   No murmur heard. Pulmonary/Chest: Effort normal and breath sounds normal. No respiratory distress.  Abdominal: Soft. There is no tenderness.  Musculoskeletal: Normal range of motion. She exhibits no edema or tenderness.  Neurological: She is alert and oriented to person, place, and time. She has normal strength. No cranial nerve deficit or sensory deficit. Coordination normal. GCS eye subscore is 4. GCS verbal subscore is 5. GCS motor subscore is 6.  Patient fully alert, answering questions appropriately in full, clear sentences. Cranial nerves II through XII grossly intact. Moderate tongue fasciculations noted. Mild tremulousness of extremities. Motor strength 5 out of 5 in all major muscle groups of upper and lower extremities. Distal sensation intact.   Skin: Skin is warm and dry. No rash noted. She is not diaphoretic.  Psychiatric:  Patient's mood is depressed with affect appropriate to mood. Speech is slowed, however communicative. Behavior is slowed, patient does not appear to be responding to any internal stimuli. Thought  content is revolving around patient's feelings of depression, alcohol abuse problems. Patient endorses not wanting to live, however denies specific thoughts of wanting to harm herself. Judgment and insight are appropriate.  Nursing note and vitals reviewed.   ED Course  Procedures (including critical care time) Labs Review Labs Reviewed  CBC WITH DIFFERENTIAL/PLATELET - Abnormal; Notable for the following:    RBC 3.79 (*)    Hemoglobin 11.4 (*)    HCT 34.3 (*)    RDW 15.6 (*)    All other components within normal limits  COMPREHENSIVE METABOLIC PANEL - Abnormal; Notable for the following:    Creatinine, Ser 0.41 (*)    Calcium 7.7 (*)    All other components within normal limits  URINE RAPID DRUG SCREEN (HOSP PERFORMED) - Abnormal; Notable for the following:    Benzodiazepines POSITIVE (*)    All other components within normal limits  ETHANOL - Abnormal; Notable for the following:    Alcohol, Ethyl (B) 230 (*)    All other components within normal limits  URINALYSIS, ROUTINE W REFLEX MICROSCOPIC - Abnormal; Notable for the following:    Hgb urine dipstick TRACE (*)    Ketones, ur 15 (*)    All other  components within normal limits  PREGNANCY, URINE  URINE MICROSCOPIC-ADD ON    Imaging Review No results found.   EKG Interpretation   Date/Time:  Sunday Nov 12 2014 07:02:43 EDT Ventricular Rate:  85 PR Interval:  131 QRS Duration: 87 QT Interval:  411 QTC Calculation: 489 R Axis:   42 Text Interpretation:  Sinus rhythm Abnormal R-wave progression, early  transition Borderline prolonged QT interval Otherwise no significant  change Confirmed by HARRISON  MD, FORREST ([id number]) on 11/12/2014 7:06:15 AM      MDM   Final diagnoses:  Alcohol dependence with withdrawal, uncomplicated  Suicidal ideation  Substance induced mood disorder    Patient's last drink was at 4 AM this morning. Patient is beginning to have early signs of withdrawal. Patient is hemodynamically stable.  Vitals are stable this time. Considered discharged for follow-up as outpatient, however patient has failed Librium therapy this week, has failed outpatient therapy as she is already followed up with her outpatient psychotherapist for alcohol detox, is now endorsing some suicidal ideation, passive suicidal thoughts such as "I don't want to live anymore". Patient denies specifically wanting to cause harm to herself to me. Based on passive suicidal ideation, concerning the patient is unable to follow up for alcohol withdrawal as outpatient and signs and symptoms of alcohol withdrawal given patient's heavy drinking, and social situation, we'll place TTS consult and place patient in psych hold. No concern for encephalopathy or organic cause of patient's signs and symptoms. Patient clinically sober. Patient medically cleared at this point.  BP 120/80 mmHg  Pulse 97  Temp(Src) 98.7 F (37.1 C) (Oral)  Resp 16  SpO2 98%  Signed,  Dahlia Bailiff, PA-C 4:44 PM     Dahlia Bailiff, PA-C 11/12/14 [place], MD 11/12/14 2046

## 2014-11-12 NOTE — Progress Notes (Signed)
Patient did attend the evening speaker AA meeting.  
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
## 2014-11-12 NOTE — ED Notes (Signed)
Pt has been accepted to [facility] for admission and can transport after 1230

## 2014-11-12 NOTE — Tx Team (Signed)
Initial Interdisciplinary Treatment Plan   PATIENT STRESSORS: Financial difficulties Medication change or noncompliance Substance abuse   PATIENT STRENGTHS: Average or above average intelligence General fund of knowledge Motivation for treatment/growth Physical Health   PROBLEM LIST: Problem List/Patient Goals Date to be addressed Date deferred Reason deferred Estimated date of resolution  "Not managing my depression well" 11/12/14     Substance abuse 11/12/14     Suicidal Ideation 11/12/14     Depression 11/12/14                                    DISCHARGE CRITERIA:  Improved stabilization in mood, thinking, and/or behavior Withdrawal symptoms are absent or subacute and managed without 24-hour nursing intervention  PRELIMINARY DISCHARGE PLAN: Attend 12-step recovery group Outpatient therapy Return to previous living arrangement Return to previous work or school arrangements  PATIENT/FAMIILY INVOLVEMENT: This treatment plan has been presented to and reviewed with the patient, Shannon Cervantes, and/or family member.  The patient and family have been given the opportunity to ask questions and make suggestions.  Meher, Kucinski T 11/12/2014, 4:17 PM

## 2014-11-12 NOTE — Progress Notes (Signed)
Patient ID: Shannon Cervantes, female   DOB: [birth year], 44 y.o.   MRN: [id number]  44 year old female admitted for detox from alcohol. Pt reports that she relapsed from alcohol in April after 7 months of sobriety after learning that "Alfonse Spruce" passed away. Pt states that his death was devastating for her because she is one of his biggest fans. Pt also states "I haven't been managing my depression well". "I struggle with loneliness". "I isolate and withdraw when I'm drinking". Pt states that she hasn't been taking her meds like she should. Pt currently denies SI/HI. Endorses AVH when she is drinking. She denies any other substance use but admits to abusing opiates in the past (2 years ago). Consents signed and pt verbalized understanding. Skin visually assessed and belongings searched. Pt oriented to unit. No complaints of pain or discomfort at this time. Q15 min checks maintained. Will continue to monitor pt.
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
## 2014-11-12 NOTE — BH Assessment (Addendum)
Consulted with Waylan Boga, DNP who states that patient meets inpatient criteria at this time. Marland Kitchen

## 2014-11-12 NOTE — ED Notes (Addendum)
TTS at bedside,will attempt to draw labs upon TTS completion
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
## 2014-11-12 NOTE — ED Notes (Signed)
Pt is passively SI stating she doesn't want to live like this but doesn't admit to wanting to harm self,  Pt is placed in wine scrubs and belongings at nurses desk

## 2014-11-12 NOTE — ED Notes (Signed)
Pt ambulatory to [facility] w/  Difficulty w/ pehlam.  Belongings given to driver.

## 2014-11-12 NOTE — BHH Counselor (Signed)
Patient accepted to [facility] 302-2 Dr. Sabra Heck attending

## 2014-11-12 NOTE — ED Notes (Signed)
TTS in see

## 2014-11-12 NOTE — ED Notes (Signed)
Pt ambulatory w/o difficulty from ED

## 2014-11-12 NOTE — ED Notes (Signed)
Up to the desk

## 2014-11-13 DIAGNOSIS — F332 Major depressive disorder, recurrent severe without psychotic features: Secondary | ICD-10-CM | POA: Diagnosis present

## 2014-11-13 DIAGNOSIS — F4310 Post-traumatic stress disorder, unspecified: Secondary | ICD-10-CM | POA: Diagnosis present

## 2014-11-13 DIAGNOSIS — F10230 Alcohol dependence with withdrawal, uncomplicated: Secondary | ICD-10-CM

## 2014-11-13 MED ORDER — LORAZEPAM 1 MG PO TABS
1.0000 mg | ORAL_TABLET | Freq: Three times a day (TID) | ORAL | Status: AC
  Administered 2014-11-14 (×3): 1 mg via ORAL
  Filled 2014-11-13 (×5): qty 1

## 2014-11-13 MED ORDER — LORAZEPAM 1 MG PO TABS
1.0000 mg | ORAL_TABLET | Freq: Four times a day (QID) | ORAL | Status: DC | PRN
Start: 2014-11-13 — End: 2014-11-15
  Administered 2014-11-15: 1 mg via ORAL
  Filled 2014-11-13: qty 1

## 2014-11-13 MED ORDER — ADULT MULTIVITAMIN W/MINERALS CH
1.0000 | ORAL_TABLET | Freq: Every day | ORAL | Status: DC
  Administered 2014-11-13 – 2014-11-17 (×5): 1 via ORAL
  Filled 2014-11-13 (×8): qty 1

## 2014-11-13 MED ORDER — LORAZEPAM 1 MG PO TABS
1.0000 mg | ORAL_TABLET | Freq: Two times a day (BID) | ORAL | Status: AC
  Administered 2014-11-15 (×2): 1 mg via ORAL
  Filled 2014-11-13 (×2): qty 1

## 2014-11-13 MED ORDER — LORAZEPAM 1 MG PO TABS
1.0000 mg | ORAL_TABLET | Freq: Four times a day (QID) | ORAL | Status: AC
  Administered 2014-11-13 (×4): 1 mg via ORAL
  Filled 2014-11-13 (×2): qty 1

## 2014-11-13 MED ORDER — OXYCODONE HCL 5 MG PO TABS
ORAL_TABLET | ORAL | Status: AC
  Filled 2014-11-13: qty 1

## 2014-11-13 MED ORDER — KETOROLAC TROMETHAMINE 15 MG/ML IJ SOLN
15.0000 mg | Freq: Once | INTRAMUSCULAR | Status: AC
  Administered 2014-11-13: 15 mg via INTRAMUSCULAR
  Filled 2014-11-13 (×2): qty 1

## 2014-11-13 MED ORDER — RAMELTEON 8 MG PO TABS
8.0000 mg | ORAL_TABLET | Freq: Every day | ORAL | Status: DC
  Administered 2014-11-13 – 2014-11-16 (×3): 8 mg via ORAL
  Filled 2014-11-13 (×7): qty 1

## 2014-11-13 MED ORDER — LORAZEPAM 1 MG PO TABS
1.0000 mg | ORAL_TABLET | Freq: Every day | ORAL | Status: AC
  Administered 2014-11-16: 1 mg via ORAL
  Filled 2014-11-13: qty 1

## 2014-11-13 MED ORDER — ONDANSETRON 4 MG PO TBDP
4.0000 mg | ORAL_TABLET | Freq: Four times a day (QID) | ORAL | Status: AC | PRN
  Administered 2014-11-13 – 2014-11-15 (×5): 4 mg via ORAL
  Filled 2014-11-13 (×8): qty 1

## 2014-11-13 MED ORDER — BUPROPION HCL ER (XL) 150 MG PO TB24
150.0000 mg | ORAL_TABLET | Freq: Every day | ORAL | Status: DC
  Administered 2014-11-14 – 2014-11-17 (×4): 150 mg via ORAL
  Filled 2014-11-13 (×4): qty 1
  Filled 2014-11-13: qty 3
  Filled 2014-11-13: qty 1
  Filled 2014-11-13: qty 3

## 2014-11-13 MED ORDER — CYCLOBENZAPRINE HCL 10 MG PO TABS
10.0000 mg | ORAL_TABLET | Freq: Once | ORAL | Status: AC
  Administered 2014-11-13: 10 mg via ORAL
  Filled 2014-11-13 (×2): qty 1

## 2014-11-13 MED ORDER — ACAMPROSATE CALCIUM 333 MG PO TBEC
666.0000 mg | DELAYED_RELEASE_TABLET | Freq: Three times a day (TID) | ORAL | Status: DC
  Administered 2014-11-13 – 2014-11-17 (×13): 666 mg via ORAL
  Filled 2014-11-13 (×5): qty 2
  Filled 2014-11-13 (×2): qty 18
  Filled 2014-11-13: qty 2
  Filled 2014-11-13: qty 18
  Filled 2014-11-13 (×6): qty 2
  Filled 2014-11-13: qty 18
  Filled 2014-11-13 (×3): qty 2
  Filled 2014-11-13 (×2): qty 18
  Filled 2014-11-13 (×5): qty 2

## 2014-11-13 MED ORDER — HYDROXYZINE HCL 25 MG PO TABS
25.0000 mg | ORAL_TABLET | Freq: Four times a day (QID) | ORAL | Status: DC | PRN
  Filled 2014-11-13: qty 1

## 2014-11-13 MED ORDER — LOPERAMIDE HCL 2 MG PO CAPS: 2.0000 mg | ORAL_CAPSULE | ORAL | Status: AC | PRN

## 2014-11-13 MED ORDER — VITAMIN B-1 100 MG PO TABS
100.0000 mg | ORAL_TABLET | Freq: Every day | ORAL | Status: DC
  Administered 2014-11-13 – 2014-11-17 (×5): 100 mg via ORAL
  Filled 2014-11-13 (×6): qty 1

## 2014-11-13 NOTE — BHH Group Notes (Signed)
   [facility] LCSW Aftercare Discharge Planning Group Note  11/13/2014  8:45 AM   Participation Quality: Alert, Appropriate and Oriented  Mood/Affect: Depressed and Flat  Depression Rating: Patient reports experiencing low depression levels today   Anxiety Rating: 7  Thoughts of Suicide: Pt denies SI/HI  Will you contract for safety? Yes  Current AVH: Pt denies  Plan for Discharge/Comments: Pt attended discharge planning group and actively participated in group. CSW provided pt with today's workbook. Patient plans to return home to follow up with outpatient services at [facility].   Transportation Means: Pt reports access to transportation  Supports: No supports mentioned at this time  Tilden Fossa, MSW, [place] Social Worker [employer] [phone]

## 2014-11-13 NOTE — Progress Notes (Signed)
D: Pt presents depressed in affect and mood. Pt presents with withdrawal symptoms of tremors, nausea, anxiety, HA, sensitivity to light, and hot/cold sweats. Pt is currently negative for any SI/HI/AVH. Pt is visible and active within the milieu.  A: Writer administered scheduled and prn medications to pt. Continued support and availability as needed was extended to this pt. Staff continue to monitor pt with q25min checks.  R: No adverse drug reactions noted. Pt receptive to treatment. Pt remains safe at this time.

## 2014-11-13 NOTE — Clinical Social Work Note (Signed)
CSW left voicemail for patient's supervisor Howard Pouch [phone], awaiting return call.  Tilden Fossa, MSW, [place] Worker [facility] [phone]

## 2014-11-13 NOTE — BHH Group Notes (Signed)
[place] LCSW Group Therapy 11/13/2014  1:15 pm  Type of Therapy: Group Therapy Participation Level: Active  Participation Quality: Attentive, Sharing and Supportive  Affect: Appropriate  Cognitive: Alert and Oriented  Insight: Developing/Improving and Engaged  Engagement in Therapy: Developing/Improving and Engaged  Modes of Intervention: Clarification, Confrontation, Discussion, Education, Exploration,  Limit-setting, Orientation, Problem-solving, Rapport Building, Art therapist, Socialization and Support  Summary of Progress/Problems: Pt identified obstacles faced currently and processed barriers involved in overcoming these obstacles. Pt identified steps necessary for overcoming these obstacles and explored motivation (internal and external) for facing these difficulties head on. Pt further identified one area of concern in their lives and chose a goal to focus on for today. Patient discussed her history of depression and anxiety, and her relatively recent onset of alcoholism over the past few years. Patient discussed her difficulty in accepting her mental health diagnoses due to stigma and history of medication non-compliance. Patient appears to have come to a better place of acceptance since that time. She reports that she looks forward to residential treatment but does not feel that it is feasible at this time due to lack of stable child care. She shared that positive distractions helped her to not drink. CSW and other group members provided patient with emotional support and encouragement.  Tilden Fossa, MSW, [place] Worker [facility] [phone]
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
## 2014-11-13 NOTE — Progress Notes (Signed)
D: Patient complains of continuing withdrawal symptoms such as tremors, cravings, runny nose, sedation, chilling, cramping, nausea and irritability.  She rates her depression as a 3; denies any hopelessness and anxiety is a 8.  Her goal today is "to get through withdrawal."  She denies SI/HI/AVH.  She is attending groups and interacting well with staff and her peers. A: Continue to monitor medication and MD orders.  Safety checks completed every 15 minutes per protocol.  Meet 1:1 with patient to discuss concerns and offer encouragement. R: Patient's behavior is appropriate to situation.

## 2014-11-13 NOTE — Progress Notes (Signed)
Recreation Therapy Notes  Date: 11/13/14 Time: 9:30am Location: [address] Dayroom  Group Topic: Stress Management  Goal Area(s) Addresses:  Patient will verbalize importance of using healthy stress management.  Patient will identify positive emotions associated with healthy stress management.   Intervention: Stress Management  Activity :  Guided Imagery.  LRT introduced and educated patients on stress management technique of guided imagery.  Scripts were used to deliver the technique to patients, patients were asked to follow script read allowed by LRT to engage in practicing the stress management technique.  Education:  Stress Management, Discharge Planning.   Education Outcome: Needs additional education  Clinical Observations/Feedback:  Patient did not attend.   Victorino Sparrow, LRT/CTRS         Victorino Sparrow A 11/13/2014 3:41 PM

## 2014-11-13 NOTE — BHH Group Notes (Signed)
Pt attended [place] group and was attentative and appropriate
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
## 2014-11-13 NOTE — BHH Suicide Risk Assessment (Signed)
[facility] Admission Suicide Risk Assessment   Nursing information obtained from:  Patient Demographic factors:  NA Current Mental Status:  NA Loss Factors:  Financial problems / change in socioeconomic status Historical Factors:  NA Risk Reduction Factors:  Living with another person, especially a relative, Responsible for children under 43 years of age Total Time spent with patient: 45 minutes Principal Problem: Alcohol dependence with withdrawal, uncomplicated Diagnosis:   Patient Active Problem List   Diagnosis Date Noted  . Severe recurrent major depression without psychotic features [F33.2] 11/13/2014  . PTSD (post-traumatic stress disorder) [F43.10] 11/13/2014  . Alcohol dependence with withdrawal, uncomplicated [R48.546] 27/08/5007  . Suicidal ideation [R45.851] 11/12/2014  . 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 Depression, major, recurrent, moderate [F33.1] 03/21/2014  . Adjustment disorder with mixed anxiety and depressed mood [F43.23] 03/19/2014  . Chest pain [R07.9] 11/02/2013  . Obesity [E66.9] 08/21/2011  . Hypertensive cardiomyopathy [I11.9, I42.9] 08/20/2011     Continued Clinical Symptoms:  Alcohol Use Disorder Identification Test Final Score (AUDIT): 18 The "Alcohol Use Disorders Identification Test", Guidelines for Use in Primary Care, Second Edition.  World Pharmacologist [facility]). Score between 0-7:  no or low risk or alcohol related problems. Score between 8-15:  moderate risk of alcohol related problems. Score between 16-19:  high risk of alcohol related problems. Score 20 or above:  warrants further diagnostic evaluation for alcohol dependence and treatment.   CLINICAL FACTORS:   Depression:   Comorbid alcohol abuse/dependence Severe Alcohol/Substance Abuse/Dependencies  Psychiatric Specialty Exam: Physical Exam  ROS  Blood pressure 135/97, pulse 101, temperature 98.3 F (36.8 C), temperature source Oral, resp. rate 20, height 5\' 3"   (1.6 m), weight 74.844 kg (165 lb).Body mass index is 29.24 kg/(m^2).   COGNITIVE FEATURES THAT CONTRIBUTE TO RISK:  Closed-mindedness, Polarized thinking and Thought constriction (tunnel vision)    SUICIDE RISK:  Mild  PLAN OF CARE: Supportive approach/coping skills/relaspe prevention                                Alcohol dependence; detox                                Reassess and address the co morbidities                                CBT/mindfulness/stress management  Medical Decision Making:  Review of Psycho-Social Stressors (1), Review or order clinical lab tests (1), Review of Medication Regimen & Side Effects (2) and Review of New Medication or Change in Dosage (2)  I certify that inpatient services furnished can reasonably be expected to improve the patient's condition.   Elizabethanne Lusher A 11/13/2014, 6:30 PM

## 2014-11-14 MED ORDER — TRAZODONE HCL 100 MG PO TABS
100.0000 mg | ORAL_TABLET | Freq: Every day | ORAL | Status: DC
  Administered 2014-11-14 – 2014-11-15 (×2): 100 mg via ORAL
  Filled 2014-11-14 (×5): qty 1

## 2014-11-14 NOTE — Tx Team (Signed)
Interdisciplinary Treatment Plan Update (Adult) Date: 11/14/2014   Date: 11/14/2014 9:07 AM  Progress in Treatment:  Attending groups: Yes  Participating in groups: Yes  Taking medication as prescribed: Yes  Tolerating medication: Yes  Family/Significant othe contact made: No, CSW to make contact with Pt's daughter Patient understands diagnosis: Yes Discussing patient identified problems/goals with staff: Yes  Medical problems stabilized or resolved: Yes  Denies suicidal/homicidal ideation: Yes Patient has not harmed self or Others: Yes   New problem(s) identified: None at this time  Discharge Plan or Barriers: 5/24: Pt has follow-up appointments schedule with [facility] for therapy and medication management. Pt plans to return home to follow-up on an outpatient basis. Pt participating in group and therapeutic milieu.   Additional comments: 44 year old female admitted for detox from alcohol. Pt reports that she relapsed from alcohol in April after 7 months of sobriety after learning that "Alfonse Spruce" passed away. Pt states that his death was devastating for her because she is one of his biggest fans. Pt also states "I haven't been managing my depression well". "I struggle with loneliness". "I isolate and withdraw when I'm drinking". Pt states that she hasn't been taking her meds like she should. Pt currently denies SI/HI. Endorses AVH when she is drinking. She denies any other substance use but admits to abusing opiates in the past (2 years ago).   Reason for Continuation of Hospitalization:  Anxiety Depression Medical Issues Suicidal ideation   Estimated length of stay: 1-3 days  For review of initial/current patient goals, please see plan of care.   Attendees: Patient:  11/14/2014 8:30 AM   Family:  11/14/2014 8:30 AM   Physician: Dr. Carlton Adam, MD 11/14/2014 8:30 AM   Nursing: Angelina Sheriff RN [id number] 8:30 AM   Clinical Agustina Caroli, NP  11/14/2014 8:30 AM   Clinical Social Worker: Erasmo Downer Drinkard LCSWA; Peri Maris, [place] [id number] 8:30 AM   Other: Lars Pinks, Nurse Case Manager [phone] 8:30 AM   Other: Lucinda Dell; [facility]  11/10/2014 8:30 AM   Other:  11/10/2014 8:30 AM   Other:  11/10/2014 8:30 AM                    Ander Purpura Madie Reno MSW

## 2014-11-14 NOTE — Progress Notes (Signed)
D:Pt reports that she is feeling slightly better today compared to yesterday. Pt continues to have a mild headache and relates it to Welbutrin that has been lowered. Pt is appropriate on the unit attending groups and interacting. A:Offered support, encouragement and 15 minute checks. R:Pt denies si and hi. Safety maintained on the unit.

## 2014-11-14 NOTE — Progress Notes (Signed)
[facility] MD Progress Note  11/14/2014 5:29 PM Shannon Cervantes  MRN:  [id number] Subjective:  Ling states she is still not feeling right. States she hardly slept last night. She had to asked for a Trazodone when the Rozerem did not work. She is still dealing with the depression. She will addressing the PTSD more intensely when she goes back to see her therapist. She is still dealing with her daughter leaving. She is also anticipating what is going to happen at work, weather she is going to get more help or not.  Principal Problem: Alcohol dependence with withdrawal, uncomplicated Diagnosis:   Patient Active Problem List   Diagnosis Date Noted  . Severe recurrent major depression without psychotic features [F33.2] 11/13/2014  . PTSD (post-traumatic stress disorder) [F43.10] 11/13/2014  . Alcohol dependence with withdrawal, uncomplicated [W09.811] 91/47/8295  . Suicidal ideation [R45.851] 11/12/2014  . 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 Depression, major, recurrent, moderate [F33.1] 03/21/2014  . Adjustment disorder with mixed anxiety and depressed mood [F43.23] 03/19/2014  . Chest pain [R07.9] 11/02/2013  . Obesity [E66.9] 08/21/2011  . Hypertensive cardiomyopathy [I11.9, I42.9] 08/20/2011   Total Time spent with patient: 30 minutes   Past Medical History:  Past Medical History  Diagnosis Date  . Foot fracture 01-07-2011  . Hypertensive cardiomyopathy   . Depression   . Hyperlipidemia   . Anxiety   . Alcohol abuse     Past Surgical History  Procedure Laterality Date  . Cesarean section    . Gastric bypass     Family History:  Family History  Problem Relation Age of Onset  . Hypertension Mother   . Heart disease Mother   . Hyperlipidemia Mother   . Heart disease Father   . Alcohol abuse Brother   . Alcohol abuse Paternal Uncle    Social History:  History  Alcohol Use  . Yes    Comment: occasionally     History  Drug Use No    History   Social  History  . Marital Status: Divorced    Spouse Name: N/A  . Number of Children: 4  . Years of Education: 16   Occupational History  . RN    Social History Main Topics  . Smoking status: Former Smoker -- 0.01 packs/day    Types: Cigarettes  . Smokeless tobacco: Never Used     Comment: 1 Cigarettes every other Day  . Alcohol Use: Yes     Comment: occasionally  . Drug Use: No  . Sexual Activity: No   Other Topics Concern  . None   Social History Narrative   Fun: Travel and shopping   Denies religious beliefs effecting healthcare.    Additional History:    Sleep: Poor  Appetite:  Fair   Assessment:   Musculoskeletal: Strength & Muscle Tone: within normal limits Gait & Station: normal Patient leans: normal   Psychiatric Specialty Exam: Physical Exam  Review of Systems  Constitutional: Negative.   Eyes: Negative.   Respiratory: Negative.   Cardiovascular: Negative.   Gastrointestinal: Negative.   Genitourinary: Negative.   Musculoskeletal: Negative.   Skin: Negative.   Neurological: Positive for headaches.  Endo/Heme/Allergies: Negative.   Psychiatric/Behavioral: Positive for depression and substance abuse. The patient is nervous/anxious and has insomnia.     Blood pressure 113/64, pulse 86, temperature 98.4 F (36.9 C), temperature source Oral, resp. rate 16, height 5\' 3"  (1.6 m), weight 74.844 kg (165 lb).Body mass index is 29.24 kg/(m^2).  General  Appearance: Fairly Groomed  Engineer, water::  Fair  Speech:  Clear and Coherent and not spontaneous  Volume:  Decreased  Mood:  Depressed  Affect:  Depressed and Restricted  Thought Process:  Coherent and Goal Directed  Orientation:  Full (Time, Place, and Person)  Thought Content:  symptoms events worries concerns  Suicidal Thoughts:  No  Homicidal Thoughts:  No  Memory:  Immediate;   Fair Recent;   Fair Remote;   Fair  Judgement:  Fair  Insight:  Present  Psychomotor Activity:  Decreased  Concentration:   Fair  Recall:  [employer] of Knowledge:Fair  Language: Fair  Akathisia:  No  Handed:  Right  AIMS (if indicated):     Assets:  Desire for Improvement Housing Social Support Vocational/Educational  ADL's:  Intact  Cognition: WNL  Sleep:  Number of Hours: 6.5     Current Medications: Current Facility-Administered Medications  Medication Dose Route Frequency Provider Last Rate Last Dose  . acamprosate (CAMPRAL) tablet 666 mg  666 mg Oral TID WC Nicholaus Bloom, MD   666 mg at 11/14/14 1640  . acetaminophen (TYLENOL) tablet 650 mg  650 mg Oral Q6H PRN [name], NP   650 mg at 11/13/14 2158  . alum & mag hydroxide-simeth (MAALOX/MYLANTA) 200-200-20 MG/5ML suspension 30 mL  30 mL Oral Q4H PRN [name], NP      . buPROPion (WELLBUTRIN XL) 24 hr tablet 150 mg  150 mg Oral Daily Nicholaus Bloom, MD   150 mg at 11/14/14 2585  . hydrOXYzine (ATARAX/VISTARIL) tablet 25 mg  25 mg Oral Q6H PRN Nicholaus Bloom, MD      . ibuprofen (ADVIL,MOTRIN) tablet 600 mg  600 mg Oral Q8H PRN [name], NP   600 mg at 11/14/14 1033  . loperamide (IMODIUM) capsule 2-4 mg  2-4 mg Oral PRN Nicholaus Bloom, MD      . LORazepam (ATIVAN) tablet 1 mg  1 mg Oral Q6H PRN Nicholaus Bloom, MD      . Derrill Memo ON 11/15/2014] LORazepam (ATIVAN) tablet 1 mg  1 mg Oral BID Nicholaus Bloom, MD       Followed by  . [START ON 11/16/2014] LORazepam (ATIVAN) tablet 1 mg  1 mg Oral Daily Nicholaus Bloom, MD      . magnesium hydroxide (MILK OF MAGNESIA) suspension 30 mL  30 mL Oral Daily PRN [name], NP      . multivitamin with minerals tablet 1 tablet  1 tablet Oral Daily Nicholaus Bloom, MD   1 tablet at 11/14/14 [phone]  . nicotine (NICODERM CQ - dosed in mg/24 hours) patch 21 mg  21 mg Transdermal Daily [name], NP   21 mg at 11/13/14 0759  . ondansetron (ZOFRAN-ODT) disintegrating tablet 4 mg  4 mg Oral Q6H PRN Nicholaus Bloom, MD   4 mg at 11/14/14 1648  . ramelteon (ROZEREM) tablet 8 mg  8 mg Oral QHS Nicholaus Bloom, MD    8 mg at 11/13/14 2155  . thiamine (VITAMIN B-1) tablet 100 mg  100 mg Oral Daily Nicholaus Bloom, MD   100 mg at 11/14/14 0906  . traZODone (DESYREL) tablet 100 mg  100 mg Oral QHS Nicholaus Bloom, MD        Lab Results: No results found for this or any previous visit (from the past 48 hour(s)).  Physical Findings: AIMS: Facial and Oral Movements  Muscles of Facial Expression: None, normal Lips and Perioral Area: None, normal Jaw: None, normal Tongue: None, normal,Extremity Movements Upper (arms, wrists, hands, fingers): None, normal Lower (legs, knees, ankles, toes): None, normal, Trunk Movements Neck, shoulders, hips: None, normal, Overall Severity Severity of abnormal movements (highest score from questions above): None, normal Incapacitation due to abnormal movements: None, normal Patient's awareness of abnormal movements (rate only patient's report): No Awareness, Dental Status Current problems with teeth and/or dentures?: No Does patient usually wear dentures?: No  CIWA:  CIWA-Ar Total: 4 COWS:  COWS Total Score: 0  Treatment Plan Summary: Daily contact with patient to assess and evaluate symptoms and progress in treatment and Medication management Supportive approach/copign skills Alcohol dependence; continue Ativan Detox protocol Work a relapse prevention plan; Campral for cravings Insomnia; will continue the Rozerem and reassess as it might take couple of days before it starts working Will continue the Trazodone meanwhile Depression: will continue the Wellbutrin at 150 mg as she is having HA. She has experienced HA when she goes back to take it. Will use Tylenol for the HA CBT/mondfulness  Medical Decision Making:  Review of Psycho-Social Stressors (1), Review of Medication Regimen & Side Effects (2) and Review of New Medication or Change in Dosage (2)     Uday Jantz A 11/14/2014, 5:29 PM

## 2014-11-14 NOTE — BHH Group Notes (Signed)
Pt attended [place] group and was appropriate and attentive

## 2014-11-14 NOTE — BHH Group Notes (Signed)
[facility] Group Therapy 11/14/2014 1:15pm  Type of Therapy: Mental Health Association Presentation  Participation Level: Active  Participation Quality: Attentive  Affect: Appropriate  Cognitive: Oriented  Insight: Developing/Improving  Engagement in Therapy: Engaged  Modes of Intervention: Discussion, Education and Socialization  Summary of Progress/Problems: Mental Health Association ([place]) Speaker came to talk about his personal journey with substance abuse and addiction. The pt processed ways by which to relate to the speaker. [place] speaker provided handouts and educational information pertaining to groups and services offered by the [facility]. Pt was very attentive during speaker's presentation. Pt participated in discussion with peers and guest speaker.   Peri Maris, [place] 11/14/2014 1:35 PM

## 2014-11-14 NOTE — BHH Suicide Risk Assessment (Signed)
Shannon Cervantes INPATIENT:  Family/Significant Other Suicide Prevention Education  Suicide Prevention Education:  Education Completed; Shannon Cervantes, Pt's daughter, has been identified by the patient as the family member/significant other with whom the patient will be residing, and identified as the person(s) who will aid the patient in the event of a mental health crisis (suicidal ideations/suicide attempt).  With written consent from the patient, the family member/significant other has been provided the following suicide prevention education, prior to the and/or following the discharge of the patient.  The suicide prevention education provided includes the following:  Suicide risk factors  Suicide prevention and interventions  National Suicide Hotline telephone number  [facility] assessment telephone number  [facility] Emergency Assistance [place] and/or Residential Mobile Crisis Unit telephone number  Request made of family/significant other to:  Remove weapons (e.g., guns, rifles, knives), all items previously/currently identified as safety concern.    Remove drugs/medications (over-the-counter, prescriptions, illicit drugs), all items previously/currently identified as a safety concern.  The family member/significant other verbalizes understanding of the suicide prevention education information provided.  The family member/significant other agrees to remove the items of safety concern listed above.  Bo Mcclintock 11/14/2014, 11:35 AM

## 2014-11-14 NOTE — Progress Notes (Signed)
D: Pt presents with flat affect and depressed mood. Pt has withdrawal symptoms of nausea, Ha, tremors, and anxiety. Pt is currently negative for any SI/HI/AVH. Pt is visible and active within milieu.  A: Writer administered scheduled and prn medications to pt, per MD orders. Continued support and availability as needed was extended to this pt. Staff continue to monitor pt with q11min checks.  R: No adverse drug reactions noted. Pt receptive to treatment. Pt remains safe at this time.   Pt was able to receive relief from her headache with the one time order of Toradol 15 mg and 10 mg Flexeril.

## 2014-11-14 NOTE — Progress Notes (Signed)
Recreation Therapy Notes  Animal-Assisted Activity (AAA) Program Checklist/Progress Notes Patient Eligibility Criteria Checklist & Daily Group note for Rec Tx Intervention  Date: 11/14/14 Time: 2:30pm Location: [address]   AAA/T Program Assumption of Risk Form signed by Patient/ or Parent Legal Guardian yes  Patient is free of allergies or sever asthma yes  Patient reports no fear of animals yes  Patient reports no history of cruelty to animalsyes  Patient understands his/her participation is voluntary yes  Patient washes hands before animal contact yes  Patient washes hands after animal contact yes  Education: Hand Washing, Appropriate Animal Interaction   Education Outcome: Acknowledges understanding/In group clarification offered/Needs additional education.   Clinical Observations/Feedback: Patient did not attend group.   Victorino Sparrow, LRT/CTRS         Victorino Sparrow A 11/14/2014 4:27 PM

## 2014-11-15 MED ORDER — LORAZEPAM 1 MG PO TABS
1.0000 mg | ORAL_TABLET | Freq: Four times a day (QID) | ORAL | Status: DC | PRN
  Administered 2014-11-15 – 2014-11-17 (×6): 1 mg via ORAL
  Filled 2014-11-15 (×6): qty 1

## 2014-11-15 MED ORDER — HYDRALAZINE HCL 10 MG PO TABS: 10.0000 mg | ORAL_TABLET | Freq: Three times a day (TID) | ORAL | Status: DC | PRN

## 2014-11-15 MED ORDER — LISINOPRIL 5 MG PO TABS
5.0000 mg | ORAL_TABLET | Freq: Every day | ORAL | Status: DC
  Administered 2014-11-15 – 2014-11-17 (×3): 5 mg via ORAL
  Filled 2014-11-15 (×2): qty 3
  Filled 2014-11-15 (×5): qty 1

## 2014-11-15 NOTE — Progress Notes (Signed)
Recreation Therapy Notes  Date: 11/15/14 Time: 9:30am Location: [address] Group Room  Group Topic: Stress Management  Goal Area(s) Addresses:  Patient will verbalize importance of using healthy stress management.  Patient will identify positive emotions associated with healthy stress management.   Intervention: Progressive Muscle Relaxation Script  Activity :  Patient will listen as LRT leads them through progressive muscle relaxation.  Patients will be lead through each muscle group from head to toe.  Education:  Stress Management, Discharge Planning.   Education Outcome: Acknowledges edcuation/In group clarification offered/Needs additional education  Clinical Observations/Feedback: Patient did not attend.  Victorino Sparrow, LRT/CTRS         Victorino Sparrow A 11/15/2014 1:37 PM

## 2014-11-15 NOTE — Progress Notes (Signed)
D: Pt reports an overall improvement in the severity of her withdrawal symptoms.  Pt continues to have some ongoing nausea with a headache. Pt believes that her headache is due to her Wellbutrin. Pt is currently negative for any SI/HI/AVH. Pt is visible and active within milieu. Pt is compliant with her current POC.  A: Writer administered scheduled and prn medications to pt, per MD orders. Continued support and availability as needed was extended to this pt. Staff continue to monitor pt with q37min checks.  R: No adverse drug reactions noted. Pt receptive to treatment. Pt remains safe at this time.

## 2014-11-15 NOTE — Progress Notes (Signed)
[facility] MD Progress Note  11/15/2014 6:01 PM Shannon Cervantes  MRN:  [id number] Subjective:  Shannon Cervantes is having a hard time. She is experiencing increased anxiety. She is still having problems with sleep. She is doing better with the Trazodone at 9 PM as she is less groggy in the AM. She is still waking up at 4 AM states she stays tired and this affects her work as she needs to have energy motivation concentration to be effective at her job.  Principal Problem: Alcohol dependence with withdrawal, uncomplicated Diagnosis:   Patient Active Problem List   Diagnosis Date Noted  . Severe recurrent major depression without psychotic features [F33.2] 11/13/2014  . PTSD (post-traumatic stress disorder) [F43.10] 11/13/2014  . Alcohol dependence with withdrawal, uncomplicated [V95.638] 75/64/3329  . Suicidal ideation [R45.851] 11/12/2014  . 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 Depression, major, recurrent, moderate [F33.1] 03/21/2014  . Adjustment disorder with mixed anxiety and depressed mood [F43.23] 03/19/2014  . Chest pain [R07.9] 11/02/2013  . Obesity [E66.9] 08/21/2011  . Hypertensive cardiomyopathy [I11.9, I42.9] 08/20/2011   Total Time spent with patient: 30 minutes   Past Medical History:  Past Medical History  Diagnosis Date  . Foot fracture 01-07-2011  . Hypertensive cardiomyopathy   . Depression   . Hyperlipidemia   . Anxiety   . Alcohol abuse     Past Surgical History  Procedure Laterality Date  . Cesarean section    . Gastric bypass     Family History:  Family History  Problem Relation Age of Onset  . Hypertension Mother   . Heart disease Mother   . Hyperlipidemia Mother   . Heart disease Father   . Alcohol abuse Brother   . Alcohol abuse Paternal Uncle    Social History:  History  Alcohol Use  . Yes    Comment: occasionally     History  Drug Use No    History   Social History  . Marital Status: Divorced    Spouse Name: N/A  . Number of  Children: 4  . Years of Education: 16   Occupational History  . RN    Social History Main Topics  . Smoking status: Former Smoker -- 0.01 packs/day    Types: Cigarettes  . Smokeless tobacco: Never Used     Comment: 1 Cigarettes every other Day  . Alcohol Use: Yes     Comment: occasionally  . Drug Use: No  . Sexual Activity: No   Other Topics Concern  . None   Social History Narrative   Fun: Travel and shopping   Denies religious beliefs effecting healthcare.    Additional History:    Sleep: Poor  Appetite:  Fair   Assessment:   Musculoskeletal: Strength & Muscle Tone: within normal limits Gait & Station: normal Patient leans: N/A   Psychiatric Specialty Exam: Physical Exam  Review of Systems  Constitutional: Negative.   Eyes: Negative.   Respiratory: Negative.   Cardiovascular: Negative.   Gastrointestinal: Negative.   Genitourinary: Negative.   Musculoskeletal: Negative.   Skin: Negative.   Neurological: Positive for headaches.  Endo/Heme/Allergies: Negative.   Psychiatric/Behavioral: Positive for depression and substance abuse. The patient is nervous/anxious.     Blood pressure 127/85, pulse 89, temperature 98.4 F (36.9 C), temperature source Oral, resp. rate 16, height 5\' 3"  (1.6 m), weight 74.844 kg (165 lb).Body mass index is 29.24 kg/(m^2).  General Appearance: Fairly Groomed  Engineer, water::  Fair  Speech:  Clear and Coherent  Volume:  fluctuates  Mood:  Anxious and Depressed  Affect:  Depressed and Tearful  Thought Process:  Coherent and Goal Directed  Orientation:  Full (Time, Place, and Person)  Thought Content:  symptoms events worries concerns  Suicidal Thoughts:  No  Homicidal Thoughts:  No  Memory:  Immediate;   Fair Recent;   Fair Remote;   Fair  Judgement:  Fair  Insight:  Present  Psychomotor Activity:  Restlessness  Concentration:  Fair  Recall:  [employer] of Knowledge:Fair  Language: Fair  Akathisia:  No  Handed:  Right   AIMS (if indicated):     Assets:  Desire for Improvement Housing Vocational/Educational  ADL's:  Intact  Cognition: WNL  Sleep:  Number of Hours: 6.5     Current Medications: Current Facility-Administered Medications  Medication Dose Route Frequency Provider Last Rate Last Dose  . acamprosate (CAMPRAL) tablet 666 mg  666 mg Oral TID WC Nicholaus Bloom, MD   666 mg at 11/15/14 1708  . acetaminophen (TYLENOL) tablet 650 mg  650 mg Oral Q6H PRN [name], NP   650 mg at 11/15/14 1512  . alum & mag hydroxide-simeth (MAALOX/MYLANTA) 200-200-20 MG/5ML suspension 30 mL  30 mL Oral Q4H PRN [name], NP      . buPROPion (WELLBUTRIN XL) 24 hr tablet 150 mg  150 mg Oral Daily Nicholaus Bloom, MD   150 mg at 11/15/14 0816  . ibuprofen (ADVIL,MOTRIN) tablet 600 mg  600 mg Oral Q8H PRN [name], NP   600 mg at 11/15/14 0820  . loperamide (IMODIUM) capsule 2-4 mg  2-4 mg Oral PRN Nicholaus Bloom, MD      . Derrill Memo ON 11/16/2014] LORazepam (ATIVAN) tablet 1 mg  1 mg Oral Daily Nicholaus Bloom, MD      . LORazepam (ATIVAN) tablet 1 mg  1 mg Oral Q6H PRN Nicholaus Bloom, MD   1 mg at 11/15/14 1531  . magnesium hydroxide (MILK OF MAGNESIA) suspension 30 mL  30 mL Oral Daily PRN [name], NP      . multivitamin with minerals tablet 1 tablet  1 tablet Oral Daily Nicholaus Bloom, MD   1 tablet at 11/15/14 [phone]  . nicotine (NICODERM CQ - dosed in mg/24 hours) patch 21 mg  21 mg Transdermal Daily [name], NP   21 mg at 11/13/14 0759  . ondansetron (ZOFRAN-ODT) disintegrating tablet 4 mg  4 mg Oral Q6H PRN Nicholaus Bloom, MD   4 mg at 11/15/14 1512  . ramelteon (ROZEREM) tablet 8 mg  8 mg Oral QHS Nicholaus Bloom, MD   8 mg at 11/13/14 2155  . thiamine (VITAMIN B-1) tablet 100 mg  100 mg Oral Daily Nicholaus Bloom, MD   100 mg at 11/15/14 0816  . traZODone (DESYREL) tablet 100 mg  100 mg Oral QHS Nicholaus Bloom, MD   100 mg at 11/14/14 2210    Lab Results: No results found for this or any previous  visit (from the past 48 hour(s)).  Physical Findings: AIMS: Facial and Oral Movements Muscles of Facial Expression: None, normal Lips and Perioral Area: None, normal Jaw: None, normal Tongue: None, normal,Extremity Movements Upper (arms, wrists, hands, fingers): None, normal Lower (legs, knees, ankles, toes): None, normal, Trunk Movements Neck, shoulders, hips: None, normal, Overall Severity Severity of abnormal movements (highest score from questions above): None, normal Incapacitation due to abnormal movements: None, normal  Patient's awareness of abnormal movements (rate only patient's report): No Awareness, Dental Status Current problems with teeth and/or dentures?: No Does patient usually wear dentures?: No  CIWA:  CIWA-Ar Total: 4 COWS:  COWS Total Score: 0  Treatment Plan Summary: Daily contact with patient to assess and evaluate symptoms and progress in treatment and Medication management Supportive approach/coping skills Alcohol dependence; continue the Ativan detox protocol Depression; continue to work with the Wellbutrin she is still having HA as she was expecting with the Wellbutrin but she is willing to continue to take it and wait for the HA to go away Will use the Trazodone at night. She is willing to take every night to see if the sedation grogginess in the morning goes away She got very emotional in the group and states she was building up to have " a panic attack" she states the Vistaril does not work for her and requested an Ativan. She still cant tell what was discussed in the group that caused the anxiety to build up Will work on helping her develop better insight and coping skills ( will use CBT, mindfulness)  Medical Decision Making:  Review of Psycho-Social Stressors (1), Review of Medication Regimen & Side Effects (2) and Review of New Medication or Change in Dosage (2)     Sophiana Milanese A 11/15/2014, 6:01 PM

## 2014-11-15 NOTE — Progress Notes (Signed)
Notified per NS, that the patients BP in the evenings continues to be elevated. Per review of medical records, patient is s/p Stress ECHO in 2011 with noted NLVSF and mild Hypertensive CM. Will proceed with initiating hypertensive therapy with ACE Inhibitor and prn Hydralazine 10 mg prn SBP > 180 or DBP > 100. I agree with assessment and plan Geralyn Flash A. Sabra Heck, M.D.

## 2014-11-15 NOTE — BHH Group Notes (Signed)
[facility] LCSW Aftercare Discharge Planning Group Note  11/15/2014 8:45 AM  Participation Quality: Alert, Appropriate and Oriented  Mood/Affect: Flat, Lethargic; Improving Mood  Depression Rating: 0  Anxiety Rating: 3  Thoughts of Suicide: Pt denies SI/HI  Will you contract for safety? Yes  Current AVH: Pt denies  Plan for Discharge/Comments: Pt attended discharge planning group and actively participated in group. CSW provided pt with today's workbook. Pt reports feeling groggy and tired due to medications. She was able to verbalize her discharge plan and outpatient follow-up.   Transportation Means: Pt reports access to transportation  Supports: Pt reports having a supportive family.   Peri Maris, LCSWA 11/15/2014 9:25 AM

## 2014-11-15 NOTE — Progress Notes (Signed)
Pt attended NA group this evening.  

## 2014-11-15 NOTE — BHH Group Notes (Signed)
[place] LCSW Group Therapy 11/15/2014  1:15 PM Type of Therapy: Group Therapy Participation Level: Active  Participation Quality: Attentive, Sharing and Supportive  Affect: Depressed and Flat  Cognitive: Alert and Oriented  Insight: Developing/Improving and Engaged  Engagement in Therapy: Developing/Improving and Engaged  Modes of Intervention: Clarification, Confrontation, Discussion, Education, Exploration, Limit-setting, Orientation, Problem-solving, Rapport Building, Art therapist, Socialization and Support  Summary of Progress/Problems: The topic for group today was emotional regulation. This group focused on both positive and negative emotion identification and allowed group members to process ways to identify feelings, regulate negative emotions, and find healthy ways to manage internal/external emotions. Group members were asked to reflect on a time when their reaction to an emotion led to a negative outcome and explored how alternative responses using emotion regulation would have benefited them. Group members were also asked to discuss a time when emotion regulation was utilized when a negative emotion was experienced. Patient dicussed her healthy vs unhealthy ways of expressing her anger (walking away vs. Reacting with physical violence). She shared that she has to exercise her healthy coping skills often when interacting with her supervisor at work. 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 Patient reports that she plans to take better care of herself by not drinking and going to [place]. CSW and other group members provided patient with emotional support and encouragement.  Tilden Fossa, MSW, [place] Worker [facility] [phone]

## 2014-11-15 NOTE — Progress Notes (Addendum)
D: Patient continues to report withdrawal symptoms such as tremors, cravings, runny nose, sedation, chilling and nausea.  She denies any SI/HI/AVH.  She presents with sad, depressed mood; flat affect.  Patient states she is getting headaches daily and attributes it to the wellbutrin.  She states she feels "jittery" when she takes it.  Patient was given ibuprofen for same.  She is sleeping fair and appetite is good.  She is up in the milieu interacting with staff and her peers.  She denies any depressive symptoms or hopelessness; she rates her anxiety as a 10.  She is "not sure" when she wants to work on today.  She is attending groups and participating. Patient is tearful.  She feels she has kept her emotions to herself while drinking and "they are all coming out now."  States she has a good support A: Continue to monitor medication management and MD orders.  Safety checks completed every 15 minutes per protocol.  Meet 1:1 with patient to discuss concerns and offer encouragement. R: Patient's behavior is appropriate to situation.

## 2014-11-15 NOTE — Plan of Care (Signed)
Problem: Alteration in mood & ability to function due to Goal: STG: Patient verbalizes decreases in signs of withdrawal Outcome: Progressing Pt reports an improvement in the severity of her withdrawal symptoms. Pt presents less tremulous. Pt's headache may be related to the Wellbutrin vs withdrawing from alcohol. Pt was appropriate in affect this evening.

## 2014-11-16 MED ORDER — ONDANSETRON 4 MG PO TBDP
4.0000 mg | ORAL_TABLET | Freq: Three times a day (TID) | ORAL | Status: DC | PRN
  Administered 2014-11-16 – 2014-11-17 (×2): 4 mg via ORAL
  Filled 2014-11-16: qty 1

## 2014-11-16 NOTE — Progress Notes (Signed)
D: Pt presents with withdrawal symptoms of nausea, tremors, and anxiety. Pt presents with a depressed affect and mood. Pt actively participated within the milieu this evening. Pt is currently negative for any SI/HI/AVH. Pt denies having a headache. A: Writer administered scheduled and prn medications to pt. Continued support and availability as needed was extended to this pt. Staff continue to monitor pt with q42min checks.  R: No adverse drug reactions noted. Pt receptive to treatment. Pt remains safe at this time.   Pt's continues to trend with elevated BP in the evening. See On-call provider notes for details.

## 2014-11-16 NOTE — Progress Notes (Signed)
Recreation Therapy Notes  Animal-Assisted Activity (AAA) Program Checklist/Progress Notes Patient Eligibility Criteria Checklist & Daily Group note for Rec Tx Intervention  Date: 05.26.16 Time: 2:45pm Location: 8 Film/video editor   AAA/T Program Assumption of Risk Form signed by Patient/ or Parent Legal Guardian yes  Patient is free of allergies or sever asthma yes  Patient reports no fear of animals yes  Patient reports no history of cruelty to animalsyes  Patient understands his/her participation is voluntary yes  Patient washes hands before animal contact yes  Patient washes hands after animal contact yes  Behavioral Response: Attentive, engaged  Education: Contractor, Appropriate Animal Interaction   Education Outcome: Acknowledges understanding/In group clarification offered/Needs additional education.   Clinical Observations/Feedback: Patient pet the dog.  Patient also asked questions about breeds of dogs and what would be the best breed for her son.   Victorino Sparrow, LRT/CTRS         Victorino Sparrow A 11/16/2014 4:18 PM

## 2014-11-16 NOTE — Progress Notes (Signed)
D: Pt's mood is depressed and affect is congruent. Having some nausea and tremors with morning. Denies SI/HI/AVH. Did not attend morning group.  A: Support given. Verbalization encouraged. Pt encouraged to come to staff for any concerns. Medications given as prescribed. R: Pt is receptive. No complaints of pain or discomfort at this time. Q15 min safety checks maintained. Pt remains safe on the unit. Will continue to monitor.

## 2014-11-16 NOTE — Progress Notes (Signed)
Pt attended karaoke group this evening.  

## 2014-11-16 NOTE — BHH Group Notes (Signed)
[place] LCSW Group Therapy 11/16/2014 1:15 PM Type of Therapy: Group Therapy Participation Level: Active  Participation Quality: Attentive, Sharing and Supportive  Affect: Appropriate  Cognitive: Alert and Oriented  Insight: Developing/Improving and Engaged  Engagement in Therapy: Developing/Improving and Engaged  Modes of Intervention: Activity, Clarification, Confrontation, Discussion, Education, Exploration, Limit-setting, Orientation, Problem-solving, Rapport Building, Art therapist, Socialization and Support  Summary of Progress/Problems: Patient was attentive and engaged with speaker from [place]. Patient was attentive to speaker while they shared their story of dealing with mental health and overcoming it. Patient expressed interest in their programs and services and received information on their agency. Patient processed ways they can relate to the speaker.   Tilden Fossa, MSW, [place] Worker [facility] [phone]

## 2014-11-16 NOTE — Progress Notes (Signed)
[facility] MD Progress Note  11/16/2014 6:35 PM Shannon Cervantes  MRN:  [id number] Subjective:  Shannon Cervantes states she is still not feeling well. She has taken some extra Ativan as the anxiety has been building up. She is still not sleeping too well. The headache is getting better and feels she is not as depressed as when she came in. Her BP has been elevating and she was started on Lisinopril last night. She hopes the campral helps her as state she really does not want to go back to drinking Principal Problem: Alcohol dependence with withdrawal, uncomplicated Diagnosis:   Patient Active Problem List   Diagnosis Date Noted  . Severe recurrent major depression without psychotic features [F33.2] 11/13/2014  . PTSD (post-traumatic stress disorder) [F43.10] 11/13/2014  . Alcohol dependence with withdrawal, uncomplicated [D63.875] 64/33/2951  . Suicidal ideation [R45.851] 11/12/2014  . 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 Depression, major, recurrent, moderate [F33.1] 03/21/2014  . Adjustment disorder with mixed anxiety and depressed mood [F43.23] 03/19/2014  . Chest pain [R07.9] 11/02/2013  . Obesity [E66.9] 08/21/2011  . Hypertensive cardiomyopathy [I11.9, I42.9] 08/20/2011   Total Time spent with patient: 30 minutes   Past Medical History:  Past Medical History  Diagnosis Date  . Foot fracture 01-07-2011  . Hypertensive cardiomyopathy   . Depression   . Hyperlipidemia   . Anxiety   . Alcohol abuse     Past Surgical History  Procedure Laterality Date  . Cesarean section    . Gastric bypass     Family History:  Family History  Problem Relation Age of Onset  . Hypertension Mother   . Heart disease Mother   . Hyperlipidemia Mother   . Heart disease Father   . Alcohol abuse Brother   . Alcohol abuse Paternal Uncle    Social History:  History  Alcohol Use  . Yes    Comment: occasionally     History  Drug Use No    History   Social History  . Marital Status:  Divorced    Spouse Name: N/A  . Number of Children: 4  . Years of Education: 16   Occupational History  . RN    Social History Main Topics  . Smoking status: Former Smoker -- 0.01 packs/day    Types: Cigarettes  . Smokeless tobacco: Never Used     Comment: 1 Cigarettes every other Day  . Alcohol Use: Yes     Comment: occasionally  . Drug Use: No  . Sexual Activity: No   Other Topics Concern  . None   Social History Narrative   Fun: Travel and shopping   Denies religious beliefs effecting healthcare.    Additional History:    Sleep: Poor  Appetite:  Fair   Assessment:   Musculoskeletal: Strength & Muscle Tone: within normal limits Gait & Station: normal Patient leans: normal   Psychiatric Specialty Exam: Physical Exam  Review of Systems  Constitutional: Negative.   Eyes: Negative.   Respiratory: Negative.   Cardiovascular: Negative.   Gastrointestinal: Negative.   Genitourinary: Negative.   Musculoskeletal: Negative.   Skin: Negative.   Neurological: Positive for headaches.  Endo/Heme/Allergies: Negative.   Psychiatric/Behavioral: Positive for depression and substance abuse. The patient is nervous/anxious and has insomnia.     Blood pressure 120/80, pulse 92, temperature 98.2 F (36.8 C), temperature source Oral, resp. rate 16, height 5\' 3"  (1.6 m), weight 74.844 kg (165 lb).Body mass index is 29.24 kg/(m^2).  General Appearance: Fairly Groomed  Eye Contact::  Fair  Speech:  Clear and Coherent  Volume:  Normal  Mood:  Anxious and Depressed  Affect:  Restricted  Thought Process:  Coherent and Goal Directed  Orientation:  Full (Time, Place, and Person)  Thought Content:  symptoms events worries concerns  Suicidal Thoughts:  No  Homicidal Thoughts:  No  Memory:  Immediate;   Fair Recent;   Fair Remote;   Fair  Judgement:  Fair  Insight:  Present  Psychomotor Activity:  Decreased  Concentration:  Fair  Recall:  [employer] of Knowledge:Fair   Language: Fair  Akathisia:  No  Handed:  Right  AIMS (if indicated):     Assets:  Desire for Improvement Housing Social Support Talents/Skills  ADL's:  Intact  Cognition: WNL  Sleep:  Number of Hours: 6     Current Medications: Current Facility-Administered Medications  Medication Dose Route Frequency Provider Last Rate Last Dose  . acamprosate (CAMPRAL) tablet 666 mg  666 mg Oral TID WC Nicholaus Bloom, MD   666 mg at 11/16/14 1659  . acetaminophen (TYLENOL) tablet 650 mg  650 mg Oral Q6H PRN [name], NP   650 mg at 11/15/14 1512  . alum & mag hydroxide-simeth (MAALOX/MYLANTA) 200-200-20 MG/5ML suspension 30 mL  30 mL Oral Q4H PRN [name], NP      . buPROPion (WELLBUTRIN XL) 24 hr tablet 150 mg  150 mg Oral Daily Nicholaus Bloom, MD   150 mg at 11/16/14 0825  . hydrALAZINE (APRESOLINE) tablet 10 mg  10 mg Oral Q8H PRN Laverle Hobby, PA-C      . ibuprofen (ADVIL,MOTRIN) tablet 600 mg  600 mg Oral Q8H PRN [name], NP   600 mg at 11/16/14 5093  . lisinopril (PRINIVIL,ZESTRIL) tablet 5 mg  5 mg Oral Daily Nicholaus Bloom, MD   5 mg at 11/16/14 2671  . LORazepam (ATIVAN) tablet 1 mg  1 mg Oral Q6H PRN Nicholaus Bloom, MD   1 mg at 11/16/14 1540  . magnesium hydroxide (MILK OF MAGNESIA) suspension 30 mL  30 mL Oral Daily PRN [name], NP      . multivitamin with minerals tablet 1 tablet  1 tablet Oral Daily Nicholaus Bloom, MD   1 tablet at 11/16/14 [phone]  . nicotine (NICODERM CQ - dosed in mg/24 hours) patch 21 mg  21 mg Transdermal Daily [name], NP   21 mg at 11/13/14 0759  . ondansetron (ZOFRAN-ODT) disintegrating tablet 4 mg  4 mg Oral Q8H PRN Nicholaus Bloom, MD   4 mg at 11/16/14 1223  . ramelteon (ROZEREM) tablet 8 mg  8 mg Oral QHS Nicholaus Bloom, MD   8 mg at 11/15/14 2356  . thiamine (VITAMIN B-1) tablet 100 mg  100 mg Oral Daily Nicholaus Bloom, MD   100 mg at 11/16/14 0825  . traZODone (DESYREL) tablet 100 mg  100 mg Oral QHS Nicholaus Bloom, MD   100 mg at  11/15/14 2130    Lab Results: No results found for this or any previous visit (from the past 48 hour(s)).  Physical Findings: AIMS: Facial and Oral Movements Muscles of Facial Expression: None, normal Lips and Perioral Area: None, normal Jaw: None, normal Tongue: None, normal,Extremity Movements Upper (arms, wrists, hands, fingers): None, normal Lower (legs, knees, ankles, toes): None, normal, Trunk Movements Neck, shoulders, hips: None, normal, Overall Severity Severity of abnormal movements (highest score  from questions above): None, normal Incapacitation due to abnormal movements: None, normal Patient's awareness of abnormal movements (rate only patient's report): No Awareness, Dental Status Current problems with teeth and/or dentures?: No Does patient usually wear dentures?: No  CIWA:  CIWA-Ar Total: 6 COWS:  COWS Total Score: 0  Treatment Plan Summary: Daily contact with patient to assess and evaluate symptoms and progress in treatment and Medication management Supportive approach/coping skills Alcohol dependence; complete the detox, work a relapse prevention plan Depression; continue the Wellbutrin at 150 mg in AM ( will consider increasing to 300 mg if headache gets better) Insomnia; continue to work with the Trazodone she is tolerating it better High BP; continue to monitor closely and optimize response to the antihypertensive agents CBT/mindfulness Medical Decision Making:  Review of Psycho-Social Stressors (1), Review of Medication Regimen & Side Effects (2) and Review of New Medication or Change in Dosage (2)     Jequan Shahin A 11/16/2014, 6:35 PM

## 2014-11-17 MED ORDER — LISINOPRIL 5 MG PO TABS: 5.0000 mg | ORAL_TABLET | Freq: Every day | ORAL | Status: DC

## 2014-11-17 MED ORDER — RAMELTEON 8 MG PO TABS: 8.0000 mg | ORAL_TABLET | Freq: Every evening | ORAL | Status: DC | PRN

## 2014-11-17 MED ORDER — BUPROPION HCL ER (XL) 150 MG PO TB24: 150.0000 mg | ORAL_TABLET | Freq: Every day | ORAL | Status: DC

## 2014-11-17 MED ORDER — ACAMPROSATE CALCIUM 333 MG PO TBEC: 666.0000 mg | DELAYED_RELEASE_TABLET | Freq: Three times a day (TID) | ORAL | Status: DC

## 2014-11-17 MED ORDER — PANTOPRAZOLE SODIUM 40 MG PO TBEC: 40.0000 mg | DELAYED_RELEASE_TABLET | Freq: Every day | ORAL | Status: DC

## 2014-11-17 MED ORDER — NICOTINE 21 MG/24HR TD PT24: 21.0000 mg | MEDICATED_PATCH | Freq: Every day | TRANSDERMAL | Status: DC

## 2014-11-17 NOTE — Discharge Summary (Signed)
Physician Discharge Summary Note  Patient:  Shannon Cervantes is an 44 y.o., female MRN:  [id number] DOB:  [birth year] Patient phone:  [phone] (home)  Patient address:   [place] [address],  Total Time spent with patient: 45 minutes  Date of Admission:  11/12/2014 Date of Discharge: 11/17/2014  Reason for Admission:  Alcohol substance  Principal Problem: Alcohol dependence with withdrawal, uncomplicated Discharge Diagnoses: Patient Active Problem List   Diagnosis Date Noted  . Severe recurrent major depression without psychotic features [F33.2] 11/13/2014  . PTSD (post-traumatic stress disorder) [F43.10] 11/13/2014  . Alcohol dependence with withdrawal, uncomplicated [O24.235] 36/14/4315  . Suicidal ideation [R45.851] 11/12/2014  . 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 Depression, major, recurrent, moderate [F33.1] 03/21/2014  . Adjustment disorder with mixed anxiety and depressed Cervantes [F43.23] 03/19/2014  . Chest pain [R07.9] 11/02/2013  . Obesity [E66.9] 08/21/2011  . Hypertensive cardiomyopathy [I11.9, I42.9] 08/20/2011    Musculoskeletal: Strength & Muscle Tone: within normal limits Gait & Station: normal Patient leans: N/A  Psychiatric Specialty Exam:  SEE SRA Physical Exam  Vitals reviewed.   Review of Systems  All other systems reviewed and are negative.   Blood pressure 128/82, pulse 91, temperature 97.4 F (36.3 C), temperature source Oral, resp. rate 18, height 5\' 3"  (1.6 m), weight 74.844 kg (165 lb).Body mass index is 29.24 kg/(m^2).  Have you used any form of tobacco in the last 30 days? (Cigarettes, Smokeless Tobacco, Cigars, and/or Pipes): No  Has this patient used any form of tobacco in the last 30 days? (Cigarettes, Smokeless Tobacco, Cigars, and/or Pipes) No  Past Medical History:  Past Medical History  Diagnosis Date  . Foot fracture 01-07-2011  . Hypertensive cardiomyopathy   . Depression   .  Hyperlipidemia   . Anxiety   . Alcohol abuse     Past Surgical History  Procedure Laterality Date  . Cesarean section    . Gastric bypass     Family History:  Family History  Problem Relation Age of Onset  . Hypertension Mother   . Heart disease Mother   . Hyperlipidemia Mother   . Heart disease Father   . Alcohol abuse Brother   . Alcohol abuse Paternal Uncle    Social History:  History  Alcohol Use  . Yes    Comment: occasionally     History  Drug Use No    History   Social History  . Marital Status: Divorced    Spouse Name: N/A  . Number of Children: 4  . Years of Education: 16   Occupational History  . RN    Social History Main Topics  . Smoking status: Former Smoker -- 0.01 packs/day    Types: Cigarettes  . Smokeless tobacco: Never Used     Comment: 1 Cigarettes every other Day  . Alcohol Use: Yes     Comment: occasionally  . Drug Use: No  . Sexual Activity: No   Other Topics Concern  . None   Social History Narrative   Fun: Travel and shopping   Denies religious beliefs effecting healthcare.     Past Psychiatric History: Hospitalizations:  Outpatient Care:  Substance Abuse Care:  Self-Mutilation:  Suicidal Attempts:  Violent Behaviors:   Risk to Self: Is patient at risk for suicide?: Yes Risk to Others:   Prior Inpatient Therapy:   Prior Outpatient Therapy:    Level of Care:  OP  Hospital Course:  Shannon Cervantes  was admitted for Alcohol dependence with withdrawal, uncomplicated and crisis management.  She was treated discharged with the medications listed below under Medication List.  Medical problems were identified and treated as needed.  Home medications were restarted as appropriate.  Improvement was monitored by observation and Shannon Cervantes daily report of symptom reduction.  Emotional and mental status was monitored by daily self-inventory reports completed by Shannon Cervantes and clinical staff.         Shannon Cervantes was evaluated  by the treatment team for stability and plans for continued recovery upon discharge.  Shannon Cervantes motivation was an integral factor for scheduling further treatment.  Employment, transportation, bed availability, health status, family support, and any pending legal issues were also considered during her hospital stay.  She was offered further treatment options upon discharge including but not limited to Residential, Intensive Outpatient, and Outpatient treatment.  Shannon Cervantes will follow up with the services as listed below under Follow Up Information.     Upon completion of this admission the patient was both mentally and medically stable for discharge denying suicidal/homicidal ideation, auditory/visual/tactile hallucinations, delusional thoughts and paranoia.      Consults:  psychiatry  Significant Diagnostic Studies:  labs: per ED  Discharge Vitals:   Blood pressure 128/82, pulse 91, temperature 97.4 F (36.3 C), temperature source Oral, resp. rate 18, height 5\' 3"  (1.6 m), weight 74.844 kg (165 lb). Body mass index is 29.24 kg/(m^2). Lab Results:   No results found for this or any previous visit (from the past 72 hour(s)).  Physical Findings: AIMS: Facial and Oral Movements Muscles of Facial Expression: None, normal Lips and Perioral Area: None, normal Jaw: None, normal Tongue: None, normal,Extremity Movements Upper (arms, wrists, hands, fingers): None, normal Lower (legs, knees, ankles, toes): None, normal, Trunk Movements Neck, shoulders, hips: None, normal, Overall Severity Severity of abnormal movements (highest score from questions above): None, normal Incapacitation due to abnormal movements: None, normal Patient's awareness of abnormal movements (rate only patient's report): No Awareness, Dental Status Current problems with teeth and/or dentures?: No Does patient usually wear dentures?: No  CIWA:  CIWA-Ar Total: 0 COWS:  COWS Total Score: 0   See Psychiatric Specialty  Exam and Suicide Risk Assessment completed by Attending Physician prior to discharge.  Discharge destination:  Home  Is patient on multiple antipsychotic therapies at discharge:  No   Has Patient had three or more failed trials of antipsychotic monotherapy by history:  No    Recommended Plan for Multiple Antipsychotic Therapies: NA     Medication List    STOP taking these medications        chlordiazePOXIDE 25 MG capsule  Commonly known as:  LIBRIUM     ibuprofen 200 MG tablet  Commonly known as:  ADVIL,MOTRIN     medroxyPROGESTERone 150 MG/ML injection  Commonly known as:  DEPO-PROVERA     propranolol 10 MG tablet  Commonly known as:  INDERAL     sucralfate 1 G tablet  Commonly known as:  CARAFATE      TAKE these medications      Indication   acamprosate 333 MG tablet  Commonly known as:  CAMPRAL  Take 2 tablets (666 mg total) by mouth 3 (three) times daily with meals.   Indication:  Excessive Use of Alcohol     buPROPion 150 MG 24 hr tablet  Commonly known as:  WELLBUTRIN XL  Take 1 tablet (150 mg total) by mouth daily.  Indication:  Major Depressive Disorder     lisinopril 5 MG tablet  Commonly known as:  PRINIVIL,ZESTRIL  Take 1 tablet (5 mg total) by mouth daily.   Indication:  High Blood Pressure     nicotine 21 mg/24hr patch  Commonly known as:  NICODERM CQ - dosed in mg/24 hours  Place 1 patch (21 mg total) onto the skin daily.      pantoprazole 40 MG tablet  Commonly known as:  PROTONIX  Take 1 tablet (40 mg total) by mouth daily.   Indication:  Gastroesophageal Reflux Disease     ramelteon 8 MG tablet  Commonly known as:  ROZEREM  Take 1 tablet (8 mg total) by mouth at bedtime as needed for sleep.   Indication:  Trouble Sleeping           Follow-up Information    Follow up with [facility].   Why:  Therapy appt on Friday May 27th at 4pm & Wednesday June 15th at 4 pm with Shannon Cervantes; Medication management appointment on Thursday June  30th at 12:15 pm with Shannon Cervantes. Please call office if you need to reschedule.    Contact information:   [address], [place], [place], [place] [address] [phone]      Follow-up recommendations:  Activity:  as tol, diet as tol  Comments:  1.  Take all your medications as prescribed.              2.  Report any adverse side effects to outpatient provider.                       3.  Patient instructed to not use alcohol or illegal drugs while on prescription medicines.            4.  In the event of worsening symptoms, instructed patient to call 911, the crisis hotline or go to nearest emergency room for evaluation of symptoms.  Total Discharge Time: 40 min  Signed: Freda Munro May Cervantes AGNP-BC 11/17/2014, 2:07 PM  I personally assessed the patient and formulated the plan Geralyn Flash A. Sabra Heck, M.D.

## 2014-11-17 NOTE — Progress Notes (Signed)
D: Pt is optimistic that she is able to return home and stay sober for herself and son. She knows that this holiday weekend may bring many opportunities for her to drink. She is up for the challenge. She plans on relaxing and doing self pampering activities.  Pt continues to have some ongoing nausea. Nausea was tolerable for pt to deny any antiemetic medications at this time. Pt is currently negative for any SI/HI/AVH.   A: Writer administered scheduled and prn medications to pt, per MD orders. Continued support and availability as needed was extended to this pt. Staff continue to monitor pt with q77min checks.  R: No adverse drug reactions noted. Pt receptive to treatment. Pt remains safe at this time.

## 2014-11-17 NOTE — Progress Notes (Signed)
Recreation Therapy Notes  Date: 05.27.16 Time: 9:30am Location: 300 Group Room  Group Topic: Stress Management  Goal Area(s) Addresses:  Patient will verbalize importance of using healthy stress management.  Patient will identify positive emotions associated with healthy stress management.   Behavioral Response: Engaged, appropriate  Intervention: Progressive Muscle Relaxation, Guided Imagery Script  Activity :  Progressive Muscle Relaxation & Guided Imagery.  LRT introduced and educated patients on stress management  Techniques of progressive muscle relaxation and guided imagery.  Scripts were used to deliver both techniques to patients, patients were asked to follow the script read allowed by LRT to engage in practicing both stress management techniques.  Education:  Stress Management, Discharge Planning.   Education Outcome: Acknowledges edcuation/In group clarification offered/Needs additional education  Clinical Observations/Feedback: Patient was engaged in the activity.  Patient also stated she felt more relaxed and imagined being on the beach.   Afton Mikelson Lindasy, LRT/CTRS         Victorino Sparrow A 11/17/2014 4:01 PM
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
## 2014-11-17 NOTE — Progress Notes (Signed)
  [facility] Adult Case Management Discharge Plan :  Will you be returning to the same living situation after discharge:  Yes,  patient plans to return home At discharge, do you have transportation home?: Yes,  Patient reports that her brother will provide transportation Do you have the ability to pay for your medications: Yes,  patient will be provided with prescriptions at discharge  Release of information consent forms completed and in the chart;  Patient's signature needed at discharge.  Patient to Follow up at: Follow-up Information    Follow up with [facility].   Why:  Therapy appt on Friday May 27th at 4pm & Wednesday June 15th at 4 pm with Wells Guiles; Medication management appointment on Thursday June 30th at 12:15 pm with Noemi Chapel. Please call office if you need to reschedule.    Contact information:   [address] [place], [place], [place] [address] [phone]      Patient denies SI/HI: Yes,  denies    Safety Planning and Suicide Prevention discussed: Yes,  with patient and daughter  Have you used any form of tobacco in the last 30 days? (Cigarettes, Smokeless Tobacco, Cigars, and/or Pipes): No  Has patient been referred to the Quitline?: N/A patient is not a smoker  Yaritzel Stange L 11/17/2014, 10:26 AM

## 2014-11-17 NOTE — Tx Team (Signed)
Interdisciplinary Treatment Plan Update (Adult) Date: 11/17/2014   Time Reviewed: 9:30 AM  Progress in Treatment: Attending groups: Yes Participating in groups: Yes Taking medication as prescribed: Yes Tolerating medication: Yes Family/Significant other contact made: Yes, CSW has spoken with patient's daughter Patient understands diagnosis: Yes Discussing patient identified problems/goals with staff: Yes Medical problems stabilized or resolved: Yes Denies suicidal/homicidal ideation: Yes Issues/concerns per patient self-inventory: Yes Other:  New problem(s) identified: N/A  Discharge Plan or Barriers:  5/27: Patient plans to return home to follow up with outpatient services at [facility].  Reason for Continuation of Hospitalization:  Depression Anxiety Medication Stabilization   Comments: N/A  Estimated length of stay: Discharge anticipated for today 11/17/14.   For review of initial/current patient goals, please see plan of care. Shannon Cervantes is a 44yo female who is hospitalized for alcohol detox, SI, anxiety. She was at [facility] 02/2015 and went to [facility] for a few weeks but did not complete. 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 She goes to [facility] Shannon Cervantes for med mgmt and Shannon Cervantes for therapy). She would like to go to rehab, has insurance and feels could get FMLA time, but son is not out of school until June 8th, at which point she could take him to relatives. She was on a Vivitrol injection and it worked, but left knots on body. Campral works to keep her from drinking but she is not consistent about taking it or other psychiatric meds. She went to some AA meetings, did not get a permanent sponsor, will consider resuming. The patient would benefit from safety monitoring, medication  evaluation, psychoeducation, group therapy, and discharge planning to link with ongoing resources. The patient refused referral to [facility] for smoking cessation. The Discharge Process and Patient Involvement form was reviewed with patient at the end of the Psychosocial Assessment, and the patient confirmed understanding and signed that document, which was placed in the paper chart.   Attendees: Patient:    Family:    Physician: Dr. Sabra Heck 11/17/2014 9:30 AM  Nursing: Beverly Sessions; Cher Nakai, RN 11/17/2014 9:30 AM  Clinical Social Worker: Tilden Fossa,  [place] 11/17/2014 9:30 AM  Other: Peri Maris, LCSWA  11/17/2014 9:30 AM  Other: Lucinda Dell, Beverly Sessions Liaison 11/17/2014 9:30 AM  Other: Lars Pinks, Case Manager 11/17/2014 9:30 AM  Other: Earleen Newport, NP  11/17/2014 9:30 AM   Scribe for Treatment Team:  Tilden Fossa, MSW, [place] [phone]

## 2014-11-17 NOTE — Progress Notes (Signed)
D: Pt's mood is depressed. Eye contact is fair. She denies SI/HI/AVH. Pt has been attending groups and interacts well with her peers.  A: Support given. Verbalization encouraged. Pt encouraged to come to staff for any concerns. Medications given as prescribed. R: Pt is receptive. Q15 min safety checks maintained. Pt remains safe on the unit. Will continue to monitor.

## 2014-11-17 NOTE — BHH Group Notes (Signed)
   [facility] LCSW Aftercare Discharge Planning Group Note  11/17/2014  8:45 AM   Participation Quality: Alert, Appropriate and Oriented  Mood/Affect: Appropriate  Depression Rating: 0  Anxiety Rating: 7  Thoughts of Suicide: Pt denies SI/HI  Will you contract for safety? Yes  Current AVH: Pt denies  Plan for Discharge/Comments: Pt attended discharge planning group and actively participated in group. CSW provided pt with today's workbook. Patient reports feeling ready to discharge today but endorses anxiety related to fear of relapsing. She plans to return home to follow up with outpatient services at [facility]. She has been provided with a list of local AA meetings and residential treatment centers.  Transportation Means: Pt reports access to transportation  Supports: No supports mentioned at this time  Tilden Fossa, MSW, [place] Social Worker [employer] [phone]

## 2014-11-17 NOTE — Progress Notes (Signed)
Discharge orders received. Pt. Agreeable to discharge. AVS and prescriptions provided and reviewed. Pt. Verbalizes understanding of discharge instructions. Pt. Offers no questions or concerns. Pt. Denies SI, HI, pain or discomfort.  Pt. Belongings returned and signed for. Pt. Escorted to lobby.  

## 2014-11-19 ENCOUNTER — Emergency Department (HOSPITAL_COMMUNITY)
Admission: EM | Admit: 2014-11-19 | Discharge: 2014-11-19 | Disposition: A | Discharge status: Home / Self Care | Payer: 59 | Attending: Emergency Medicine | Admitting: Emergency Medicine

## 2014-11-19 ENCOUNTER — Encounter (HOSPITAL_COMMUNITY): Payer: Self-pay

## 2014-11-19 DIAGNOSIS — F329 Major depressive disorder, single episode, unspecified: Secondary | ICD-10-CM | POA: Diagnosis not present

## 2014-11-19 DIAGNOSIS — Z9119 Patient's noncompliance with other medical treatment and regimen: Secondary | ICD-10-CM | POA: Diagnosis not present

## 2014-11-19 DIAGNOSIS — F419 Anxiety disorder, unspecified: Secondary | ICD-10-CM | POA: Diagnosis not present

## 2014-11-19 DIAGNOSIS — Z8679 Personal history of other diseases of the circulatory system: Secondary | ICD-10-CM | POA: Insufficient documentation

## 2014-11-19 DIAGNOSIS — F1010 Alcohol abuse, uncomplicated: Secondary | ICD-10-CM | POA: Diagnosis present

## 2014-11-19 DIAGNOSIS — F1020 Alcohol dependence, uncomplicated: Secondary | ICD-10-CM

## 2014-11-19 DIAGNOSIS — Z9114 Patient's other noncompliance with medication regimen: Secondary | ICD-10-CM

## 2014-11-19 DIAGNOSIS — Z8781 Personal history of (healed) traumatic fracture: Secondary | ICD-10-CM | POA: Diagnosis not present

## 2014-11-19 DIAGNOSIS — Z87891 Personal history of nicotine dependence: Secondary | ICD-10-CM | POA: Insufficient documentation

## 2014-11-19 DIAGNOSIS — Z8639 Personal history of other endocrine, nutritional and metabolic disease: Secondary | ICD-10-CM | POA: Insufficient documentation

## 2014-11-19 DIAGNOSIS — F4310 Post-traumatic stress disorder, unspecified: Secondary | ICD-10-CM

## 2014-11-19 DIAGNOSIS — Z79899 Other long term (current) drug therapy: Secondary | ICD-10-CM | POA: Diagnosis not present

## 2014-11-19 DIAGNOSIS — Z91148 Patient's other noncompliance with medication regimen for other reason: Secondary | ICD-10-CM

## 2014-11-19 LAB — CBC
HCT: 36 % (ref 36.0–46.0)
Hemoglobin: 11.6 g/dL — ABNORMAL LOW (ref 12.0–15.0)
MCH: 29.4 pg (ref 26.0–34.0)
MCHC: 32.2 g/dL (ref 30.0–36.0)
MCV: 91.1 fL (ref 78.0–100.0)
Platelets: 419 10*3/uL — ABNORMAL HIGH (ref 150–400)
RBC: 3.95 MIL/uL (ref 3.87–5.11)
RDW: 16.3 % — ABNORMAL HIGH (ref 11.5–15.5)
WBC: 8 10*3/uL (ref 4.0–10.5)

## 2014-11-19 LAB — ETHANOL: Alcohol, Ethyl (B): 63 mg/dL — ABNORMAL HIGH (ref <5)

## 2014-11-19 LAB — COMPREHENSIVE METABOLIC PANEL
ALK PHOS: 84 U/L (ref 38–126)
ALT: 23 U/L (ref 14–54)
AST: 25 U/L (ref 15–41)
Albumin: 4.2 g/dL (ref 3.5–5.0)
Anion gap: 10 (ref 5–15)
BUN: 14 mg/dL (ref 6–20)
CALCIUM: 8.5 mg/dL — AB (ref 8.9–10.3)
CO2: 21 mmol/L — ABNORMAL LOW (ref 22–32)
Chloride: 110 mmol/L (ref 101–111)
Creatinine, Ser: 0.42 mg/dL — ABNORMAL LOW (ref 0.44–1.00)
Glucose, Bld: 79 mg/dL (ref 65–99)
Potassium: 3.7 mmol/L (ref 3.5–5.1)
SODIUM: 141 mmol/L (ref 135–145)
TOTAL PROTEIN: 7.9 g/dL (ref 6.5–8.1)
Total Bilirubin: 0.3 mg/dL (ref 0.3–1.2)

## 2014-11-19 LAB — SALICYLATE LEVEL: Salicylate Lvl: <4 mg/dL (ref 2.8–30.0)

## 2014-11-19 LAB — ACETAMINOPHEN LEVEL

## 2014-11-19 MED ORDER — LISINOPRIL 10 MG PO TABS
10.0000 mg | ORAL_TABLET | Freq: Once | ORAL | Status: AC
  Administered 2014-11-19: 10 mg via ORAL
  Filled 2014-11-19 (×2): qty 1

## 2014-11-19 NOTE — ED Provider Notes (Signed)
CSN: [id number]     Arrival date & time 11/19/14  1021 History   First MD Initiated Contact with Patient 11/19/14 1033     No chief complaint on file.    (Consider location/radiation/quality/duration/timing/severity/associated sxs/prior Treatment) HPI   Shannon Cervantes is a 44 y.o. female who presents by EMS for help with an urge to drink alcohol that she cannot suppress. [facility] ago after a five-day detoxification and psychiatric treatment. Patient improved with this treatment. She went to a outpatient therapy session at 4 PM on 11/17/2014. After that, she immediately started drinking. She also drank yesterday. She denies drinking today. She denies several homicidal ideation. She has not had any fever, chills, vomiting, weakness or dizziness. She has not gotten her prescription for lisinopril, which was prescribed on 11/17/2014. There are no other known modifying factors.   Past Medical History  Diagnosis Date  . Foot fracture 01-07-2011  . Hypertensive cardiomyopathy   . Depression   . Hyperlipidemia   . Anxiety   . Alcohol abuse    Past Surgical History  Procedure Laterality Date  . Cesarean section    . Gastric bypass     Family History  Problem Relation Age of Onset  . Hypertension Mother   . Heart disease Mother   . Hyperlipidemia Mother   . Heart disease Father   . Alcohol abuse Brother   . Alcohol abuse Paternal Uncle    History  Substance Use Topics  . Smoking status: Former Smoker -- 0.01 packs/day    Types: Cigarettes  . Smokeless tobacco: Never Used     Comment: 1 Cigarettes every other Day  . Alcohol Use: Yes     Comment: daily use-1 1/2 pints cognac   OB History    No data available     Review of Systems  All other systems reviewed and are negative.     Allergies  Suboxone  Home Medications   Prior to Admission medications   Medication Sig Start Date End Date Taking? Authorizing Provider   acamprosate (CAMPRAL) 333 MG tablet Take 2 tablets (666 mg total) by mouth 3 (three) times daily with meals. 11/17/14  Yes Kerrie Buffalo, NP  buPROPion (WELLBUTRIN XL) 150 MG 24 hr tablet Take 1 tablet (150 mg total) by mouth daily. 11/17/14  Yes Kerrie Buffalo, NP  ibuprofen (ADVIL,MOTRIN) 200 MG tablet Take 800 mg by mouth every 6 (six) hours as needed for headache or moderate pain.   Yes Historical Provider, MD  pantoprazole (PROTONIX) 40 MG tablet Take 1 tablet (40 mg total) by mouth daily. 11/17/14  Yes Kerrie Buffalo, NP  ramelteon (ROZEREM) 8 MG tablet Take 1 tablet (8 mg total) by mouth at bedtime as needed for sleep. 11/17/14  Yes Kerrie Buffalo, NP  valACYclovir (VALTREX) 500 MG tablet Take 1 tablet by mouth 2 (two) times daily as needed (flare ups).  08/29/14  Yes Historical Provider, MD  lisinopril (PRINIVIL,ZESTRIL) 5 MG tablet Take 1 tablet (5 mg total) by mouth daily. 11/17/14   Kerrie Buffalo, NP  nicotine (NICODERM CQ - DOSED IN MG/24 HOURS) 21 mg/24hr patch Place 1 patch (21 mg total) onto the skin daily. Patient not taking: Reported on 11/19/2014 11/17/14   Kerrie Buffalo, NP   BP 146/86 mmHg  Pulse 94  Temp(Src) 97.5 F (36.4 C) (Oral)  Resp 20  Ht 5\' 3"  (1.6 m)  Wt 170 lb (77.111 kg)  BMI 30.12 kg/m2  SpO2  96% Physical Exam  Constitutional: She is oriented to person, place, and time. She appears well-developed and well-nourished. She appears distressed (She appears uncomfortable).  HENT:  Head: Normocephalic and atraumatic.  Right Ear: External ear normal.  Left Ear: External ear normal.  Eyes: Conjunctivae and EOM are normal. Pupils are equal, round, and reactive to light.  Neck: Normal range of motion and phonation normal. Neck supple.  Cardiovascular: Normal rate, regular rhythm and normal heart sounds.   Pulmonary/Chest: Effort normal and breath sounds normal. She exhibits no bony tenderness.  Abdominal: Soft. There is no tenderness.  Musculoskeletal: Normal range of  motion.  Neurological: She is alert and oriented to person, place, and time. No cranial nerve deficit or sensory deficit. She exhibits normal muscle tone. Coordination normal.  No dysarthria and aphasia or nystagmus  Skin: Skin is warm, dry and intact.  Psychiatric: Her behavior is normal. Judgment and thought content normal.  She has a tremor which abates during examination  Nursing note and vitals reviewed.   ED Course  Procedures (including critical care time)  Medications  lisinopril (PRINIVIL,ZESTRIL) tablet 10 mg (10 mg Oral Given 11/19/14 1240)    No data found.      Labs Review Labs Reviewed  ACETAMINOPHEN LEVEL - Abnormal; Notable for the following:    Acetaminophen (Tylenol), Serum <10 (*)    All other components within normal limits  CBC - Abnormal; Notable for the following:    Hemoglobin 11.6 (*)    RDW 16.3 (*)    Platelets 419 (*)    All other components within normal limits  COMPREHENSIVE METABOLIC PANEL - Abnormal; Notable for the following:    CO2 21 (*)    Creatinine, Ser 0.42 (*)    Calcium 8.5 (*)    All other components within normal limits  ETHANOL - Abnormal; Notable for the following:    Alcohol, Ethyl (B) 63 (*)    All other components within normal limits  SALICYLATE LEVEL    Imaging Review No results found.   EKG Interpretation None      MDM   Final diagnoses:  Alcoholism  Noncompliance with medication regimen  PTSD (post-traumatic stress disorder)    Patient with signs and symptoms of anxiety, and history of alcoholism. No indication that she would require alcohol detoxification at this time since she just recently completed treatment for that. She is not taking prescribed medication, for hypertension; indicating noncompliance, with a preference to drink alcohol instead.   Plan: Disposition as per TTS with ED provider assistance   Daleen Bo, MD 11/20/14 1626
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
## 2014-11-19 NOTE — ED Notes (Signed)
Patient is requesting alcohol detox. Patient states her last drink was at 1100 pm last night. Patient states she was in detox last week.

## 2014-11-27 ENCOUNTER — Ambulatory Visit: Payer: Self-pay | Admitting: Family

## 2014-12-11 ENCOUNTER — Encounter: Payer: Self-pay | Admitting: Family

## 2014-12-11 ENCOUNTER — Ambulatory Visit (INDEPENDENT_AMBULATORY_CARE_PROVIDER_SITE_OTHER): Payer: 59 | Admitting: Family

## 2014-12-11 ENCOUNTER — Other Ambulatory Visit (INDEPENDENT_AMBULATORY_CARE_PROVIDER_SITE_OTHER): Payer: 59

## 2014-12-11 VITALS — BP 142/92 | HR 93 | Temp 98.0°F | Resp 18 | Ht 63.0 in | Wt 160.0 lb

## 2014-12-11 DIAGNOSIS — R05 Cough: Secondary | ICD-10-CM

## 2014-12-11 DIAGNOSIS — R059 Cough, unspecified: Secondary | ICD-10-CM | POA: Insufficient documentation

## 2014-12-11 DIAGNOSIS — S6982XA Other specified injuries of left wrist, hand and finger(s), initial encounter: Secondary | ICD-10-CM

## 2014-12-11 DIAGNOSIS — R718 Other abnormality of red blood cells: Secondary | ICD-10-CM | POA: Diagnosis not present

## 2014-12-11 DIAGNOSIS — I10 Essential (primary) hypertension: Secondary | ICD-10-CM | POA: Diagnosis not present

## 2014-12-11 DIAGNOSIS — F331 Major depressive disorder, recurrent, moderate: Secondary | ICD-10-CM

## 2014-12-11 DIAGNOSIS — Z23 Encounter for immunization: Secondary | ICD-10-CM

## 2014-12-11 DIAGNOSIS — IMO0002 Reserved for concepts with insufficient information to code with codable children: Secondary | ICD-10-CM | POA: Insufficient documentation

## 2014-12-11 DIAGNOSIS — IMO0001 Reserved for inherently not codable concepts without codable children: Secondary | ICD-10-CM

## 2014-12-11 LAB — IBC PANEL
IRON: 42 ug/dL (ref 42–145)
SATURATION RATIOS: 8.7 % — AB (ref 20.0–50.0)
Transferrin: 345 mg/dL (ref 212.0–360.0)

## 2014-12-11 MED ORDER — LISINOPRIL 5 MG PO TABS: 5.0000 mg | ORAL_TABLET | Freq: Every day | ORAL | Status: DC

## 2014-12-11 MED ORDER — BUPROPION HCL ER (XL) 300 MG PO TB24: 300.0000 mg | ORAL_TABLET | Freq: Every day | ORAL | Status: DC

## 2014-12-11 MED ORDER — CEFUROXIME AXETIL 250 MG PO TABS: 250.0000 mg | ORAL_TABLET | Freq: Two times a day (BID) | ORAL | Status: DC

## 2014-12-11 NOTE — Assessment & Plan Note (Signed)
Slightly elevated blood pressure today greater than goal of 140/90. Continue current dosage of lisinopril. Monitor blood pressure at home. Follow up in 1 month.

## 2014-12-11 NOTE — Progress Notes (Signed)
Subjective:    Patient ID: Shannon Cervantes, female    DOB: Oct 29, 1970, 44 y.o.   MRN: [id number]  Chief Complaint  Patient presents with  . Follow-up    BP and depression    HPI:  Shannon Cervantes is a 44 y.o. female with a PMH of hypertensive cardiomyopathy, alcohol dependence and withdrawal, obesity, depression, and PTSD who presents today for an office follow-up. Was recently seen at [facility] for detoxification of alcohol.  1.) Blood pressure - While being treated for detoxification she was noted to have high blood pressure and was started on lisinopril. Notes that she takes the medication as prescribed but notes that her blood pressure continues to remain slightly elevated.   BP Readings from Last 3 Encounters:  12/11/14 142/92  11/19/14 146/86  11/17/14 128/82    2.) Depression - Currently managed on Wellbutrin by psychiatry with the goal of increasing her Wellbutrin to 300 mg. She reports some improvement now at the 300 mg. Denies suicidal ideations.   4.) Illness - this is a new problem. Associated symptoms of congestion, cough, and possible fever for about 1 week. Modifying factors include OTC medications which has helped a little. Denies any recent antibiotic.  5.) Needle stick - Associated symptom of a needle stick approximately 2 weeks ago located on her left index finger. There was unknown contents from the needle and she did not check to see afterwards. Notes a small puncture wound has healed.  Notes that she continues to experience some mild discomfort in you left hand.   Allergies  Allergen Reactions  . Suboxone [Buprenorphine Hcl-Naloxone Hcl] Shortness Of Breath    dizziness     Current Outpatient Prescriptions on File Prior to Visit  Medication Sig Dispense Refill  . acamprosate (CAMPRAL) 333 MG tablet Take 2 tablets (666 mg total) by mouth 3 (three) times daily with meals. 180 tablet 0  . ibuprofen (ADVIL,MOTRIN) 200 MG tablet Take 800 mg by mouth every 6 (six)  hours as needed for headache or moderate pain.    . nicotine (NICODERM CQ - DOSED IN MG/24 HOURS) 21 mg/24hr patch Place 1 patch (21 mg total) onto the skin daily. (Patient not taking: Reported on 11/19/2014) 28 patch 0  . pantoprazole (PROTONIX) 40 MG tablet Take 1 tablet (40 mg total) by mouth daily. 30 tablet 1  . ramelteon (ROZEREM) 8 MG tablet Take 1 tablet (8 mg total) by mouth at bedtime as needed for sleep. 30 tablet 0  . valACYclovir (VALTREX) 500 MG tablet Take 1 tablet by mouth 2 (two) times daily as needed (flare ups).   3   No current facility-administered medications on file prior to visit.    Past Medical History  Diagnosis Date  . Foot fracture 01-07-2011  . Hypertensive cardiomyopathy   . Depression   . Hyperlipidemia   . Anxiety   . Alcohol abuse     Review of Systems  Constitutional: Negative for fever and chills.  HENT: Positive for congestion, postnasal drip and sore throat.   Respiratory: Positive for cough. Negative for chest tightness and shortness of breath.   Gastrointestinal: Negative for nausea, abdominal pain, diarrhea, constipation and rectal pain.  Neurological: Negative for headaches.      Objective:    BP 142/92 mmHg  Pulse 93  Temp(Src) 98 F (36.7 C) (Oral)  Resp 18  Ht 5\' 3"  (1.6 m)  Wt 160 lb (72.576 kg)  BMI 28.35 kg/m2  SpO2 98% Nursing note  and vital signs reviewed.  Physical Exam  Constitutional: She is oriented to person, place, and time. She appears well-developed and well-nourished. No distress.  Cardiovascular: Normal rate, regular rhythm, normal heart sounds and intact distal pulses.   Pulmonary/Chest: Effort normal and breath sounds normal.  Neurological: She is alert and oriented to person, place, and time.  Skin: Skin is warm and dry.  Psychiatric: Her behavior is normal. Judgment and thought content normal. She exhibits a depressed mood.       Assessment & Plan:   Problem List Items Addressed This Visit       Cardiovascular and Mediastinum   Essential hypertension    Slightly elevated blood pressure today greater than goal of 140/90. Continue current dosage of lisinopril. Monitor blood pressure at home. Follow up in 1 month.       Relevant Medications   lisinopril (PRINIVIL,ZESTRIL) 5 MG tablet     Other   Depression, major, recurrent, moderate - Primary    Appears to be stable with current regimen of Wellbutrin. Denies suicidal ideations. Continue current dosage of Wellbutrin and follow up with psychiatry and counseling.       Relevant Medications   buPROPion (WELLBUTRIN XL) 300 MG 24 hr tablet   Needle stick injury of finger    Needle stick with no appreciable wound. Obtain Hepatitis C and HIV. Update tetanus shot. Follow up pending results if needed.       Relevant Orders   HIV antibody   Hepatitis C antibody   Tdap vaccine greater than or equal to 7yo IM (Completed)   Cough    Symptoms and exam consistent with upper respiratory infection, however cannot rule out continued withdrawal of alcohol. Start ceftin. Continue over the counter medications as needed for symptoms relief and supportive care. If cough remains following antibiotics, discontinue and change lisinopril.       Relevant Medications   cefUROXime (CEFTIN) 250 MG tablet

## 2014-12-11 NOTE — Assessment & Plan Note (Addendum)
Needle stick with no appreciable wound. Obtain Hepatitis C and HIV. Update tetanus shot. Follow up pending results if needed.

## 2014-12-11 NOTE — Assessment & Plan Note (Signed)
Symptoms and exam consistent with upper respiratory infection, however cannot rule out continued withdrawal of alcohol. Start ceftin. Continue over the counter medications as needed for symptoms relief and supportive care. If cough remains following antibiotics, discontinue and change lisinopril.

## 2014-12-11 NOTE — Assessment & Plan Note (Signed)
Appears to be stable with current regimen of Wellbutrin. Denies suicidal ideations. Continue current dosage of Wellbutrin and follow up with psychiatry and counseling.

## 2014-12-11 NOTE — Patient Instructions (Addendum)
Thank you for choosing [facility].  Summary/Instructions:  Your prescription(s) have been submitted to your pharmacy or been printed and provided for you. Please take as directed and contact our office if you believe you are having problem(s) with the medication(s) or have any questions.  If your symptoms worsen or fail to improve, please contact our office for further instruction, or in case of emergency go directly to the emergency room at the closest medical facility.   General Recommendations:    Please drink plenty of fluids.  Get plenty of rest   Sleep in humidified air  Use saline nasal sprays  Netti pot   OTC Medications:  Decongestants - helps relieve congestion   Flonase (generic fluticasone) or Nasacort (generic triamcinolone) - please make sure to use the "cross-over" technique at a 45 degree angle towards the opposite eye as opposed to straight up the nasal passageway.   If you have HIGH BLOOD PRESSURE - Coricidin HBP; AVOID any product that is -D as this contains pseudoephedrine which may increase your blood pressure.  Afrin (oxymetazoline) every 6-8 hours for up to 3 days.   Allergies - helps relieve runny nose, itchy eyes and sneezing   Claritin (generic loratidine), Allegra (fexofenidine), or Zyrtec (generic cyrterizine) for runny nose. These medications should not cause drowsiness.  Note - Benadryl (generic diphenhydramine) may be used however may cause drowsiness  Cough -   Delsym or Robitussin (generic dextromethorphan)  Expectorants - helps loosen mucus to ease removal   Mucinex (generic guaifenesin) as directed on the package.  Headaches / General Aches   Tylenol (generic acetaminophen) - DO NOT EXCEED 3 grams (3,000 mg) in a 24 hour time period  Advil/Motrin (generic ibuprofen)   Sore Throat -   Salt water gargle   Chloraseptic (generic benzocaine) spray or lozenges / Sucrets (generic dyclonine)    If her cough continues  following completion of antibiotic, we will change or lisinopril to a different blood pressure medication. Continue taking her lisinopril as prescribed.  Continue to take her Wellbutrin as prescribed and follow up with psychiatry.

## 2014-12-11 NOTE — Progress Notes (Signed)
Pre visit review using our clinic review tool, if applicable. No additional management support is needed unless otherwise documented below in the visit note. 

## 2014-12-12 ENCOUNTER — Telehealth: Payer: Self-pay | Admitting: Family

## 2014-12-12 LAB — HEPATITIS C ANTIBODY: HCV Ab: NEGATIVE

## 2014-12-12 LAB — HIV ANTIBODY (ROUTINE TESTING W REFLEX): HIV 1&2 Ab, 4th Generation: NONREACTIVE

## 2014-12-12 NOTE — Telephone Encounter (Signed)
Please inform the patient that her HIV and Hep C tests are negative. We can retest her HIV in 6 months to confirm.

## 2014-12-13 NOTE — Telephone Encounter (Signed)
Pt aware of results 

## 2015-01-09 ENCOUNTER — Ambulatory Visit: Payer: Self-pay | Admitting: Family

## 2015-01-11 ENCOUNTER — Encounter: Payer: Self-pay | Admitting: Family

## 2015-01-11 ENCOUNTER — Ambulatory Visit (INDEPENDENT_AMBULATORY_CARE_PROVIDER_SITE_OTHER): Payer: 59 | Admitting: Family

## 2015-01-11 ENCOUNTER — Other Ambulatory Visit (INDEPENDENT_AMBULATORY_CARE_PROVIDER_SITE_OTHER): Payer: 59

## 2015-01-11 VITALS — BP 142/94 | HR 101 | Temp 97.6°F | Resp 18 | Ht 63.0 in | Wt 159.0 lb

## 2015-01-11 DIAGNOSIS — A084 Viral intestinal infection, unspecified: Secondary | ICD-10-CM | POA: Diagnosis not present

## 2015-01-11 LAB — CBC
HCT: 39.4 % (ref 36.0–46.0)
Hemoglobin: 13.2 g/dL (ref 12.0–15.0)
MCHC: 33.4 g/dL (ref 30.0–36.0)
MCV: 88.3 fl (ref 78.0–100.0)
Platelets: 380 10*3/uL (ref 150.0–400.0)
RBC: 4.46 Mil/uL (ref 3.87–5.11)
RDW: 16.7 % — ABNORMAL HIGH (ref 11.5–15.5)
WBC: 8.3 10*3/uL (ref 4.0–10.5)

## 2015-01-11 LAB — BASIC METABOLIC PANEL
BUN: 9 mg/dL (ref 6–23)
CALCIUM: 9.2 mg/dL (ref 8.4–10.5)
CO2: 29 mEq/L (ref 19–32)
CREATININE: 0.69 mg/dL (ref 0.40–1.20)
Chloride: 106 mEq/L (ref 96–112)
GFR: 119.11 mL/min (ref >60.00)
Glucose, Bld: 91 mg/dL (ref 70–99)
POTASSIUM: 3.1 meq/L — AB (ref 3.5–5.1)
SODIUM: 142 meq/L (ref 135–145)

## 2015-01-11 MED ORDER — ONDANSETRON 4 MG PO TBDP: 4.0000 mg | ORAL_TABLET | Freq: Three times a day (TID) | ORAL | Status: DC | PRN

## 2015-01-11 NOTE — Progress Notes (Signed)
Pre visit review using our clinic review tool, if applicable. No additional management support is needed unless otherwise documented below in the visit note. 

## 2015-01-11 NOTE — Progress Notes (Signed)
Subjective:    Patient ID: Shannon Cervantes, female    DOB: Mar 14, 1971, 44 y.o.   MRN: [id number]  Chief Complaint  Patient presents with  . Follow-up    woke up 2 am monday morning throwing up non stop, called ambulance and they said it could be food poisoning, had diarrhea all day yesterday, everything she eats makes her nausous    HPI:  Shannon Cervantes is a 44 y.o. female with a PMH of hypertensive cardiomyopathy, depression, alcohol abuse, PTSD who presents today for an acute office visit.   1.) Food poisoning - Associated symptoms of vomiting and diarrhea have been going on for about 3 days following eating chicken. Modifying factors include Zofran which did not help initially but is now helping with her nausea. Describes multiple episodes of vomiting and diarreha that resulted in her calling EMS. She was evaluated and deemed stable and advised to hydrate. Since Tuesday she has progressively improved and has stopped vomiting and her stools are starting to be formed. Has been attempting to hydrate as much as possible and been able to hold down soup with minimal nausea. Denies blood in vomit or stool.   Allergies  Allergen Reactions  . Suboxone [Buprenorphine Hcl-Naloxone Hcl] Shortness Of Breath    dizziness     Current Outpatient Prescriptions on File Prior to Visit  Medication Sig Dispense Refill  . buPROPion (WELLBUTRIN XL) 300 MG 24 hr tablet Take 1 tablet (300 mg total) by mouth daily. 30 tablet 0  . ibuprofen (ADVIL,MOTRIN) 200 MG tablet Take 800 mg by mouth every 6 (six) hours as needed for headache or moderate pain.    Marland Kitchen lisinopril (PRINIVIL,ZESTRIL) 5 MG tablet Take 1 tablet (5 mg total) by mouth daily. 30 tablet 0  . pantoprazole (PROTONIX) 40 MG tablet Take 1 tablet (40 mg total) by mouth daily. 30 tablet 1  . ramelteon (ROZEREM) 8 MG tablet Take 1 tablet (8 mg total) by mouth at bedtime as needed for sleep. 30 tablet 0  . valACYclovir (VALTREX) 500 MG tablet Take 1 tablet by  mouth 2 (two) times daily as needed (flare ups).   3   No current facility-administered medications on file prior to visit.    Review of Systems  Constitutional: Negative for fever and chills.  Gastrointestinal: Positive for nausea, vomiting, abdominal pain (generalized) and diarrhea. Negative for abdominal distention.  Neurological: Positive for weakness. Negative for light-headedness.      Objective:    BP 142/94 mmHg  Pulse 101  Temp(Src) 97.6 F (36.4 C) (Oral)  Resp 18  Ht 5\' 3"  (1.6 m)  Wt 159 lb (72.122 kg)  BMI 28.17 kg/m2  SpO2 98% Nursing note and vital signs reviewed.  Physical Exam  Constitutional: She is oriented to person, place, and time. She appears well-developed and well-nourished. No distress.  Cardiovascular: Normal rate, regular rhythm, normal heart sounds and intact distal pulses.   Pulmonary/Chest: Effort normal and breath sounds normal.  Abdominal: Soft. Normal appearance. She exhibits no mass. There is no hepatosplenomegaly. There is generalized tenderness. There is no rigidity, no rebound, no guarding, no tenderness at McBurney's point and negative Murphy's sign.  Neurological: She is alert and oriented to person, place, and time.  Skin: Skin is warm and dry.  Psychiatric: She has a normal mood and affect. Her behavior is normal. Judgment and thought content normal.       Assessment & Plan:   Problem List Items Addressed This Visit  Digestive   Viral gastroenteritis - Primary    Symptoms and exam consistent with viral gastroenteritis. Continue current dosage of Zofran as needed for nausea. Start with clear liquids and progress diet as tolerated to bland foods. Maintain hydration. Obtain basic metabolic panel and CBC. Follow-up if symptoms worsen or fail to improve with conservative treatment.      Relevant Medications   ondansetron (ZOFRAN ODT) 4 MG disintegrating tablet   Other Relevant Orders   CBC   Basic Metabolic Panel (BMET)

## 2015-01-11 NOTE — Assessment & Plan Note (Signed)
Symptoms and exam consistent with viral gastroenteritis. Continue current dosage of Zofran as needed for nausea. Start with clear liquids and progress diet as tolerated to bland foods. Maintain hydration. Obtain basic metabolic panel and CBC. Follow-up if symptoms worsen or fail to improve with conservative treatment.

## 2015-01-11 NOTE — Patient Instructions (Addendum)
Thank you for choosing [facility].  Summary/Instructions:  [employer] and progress as tolerated.   Your prescription(s) have been submitted to your pharmacy or been printed and provided for you. Please take as directed and contact our office if you believe you are having problem(s) with the medication(s) or have any questions.  If your symptoms worsen or fail to improve, please contact our office for further instruction, or in case of emergency go directly to the emergency room at the closest medical facility.   Viral Gastroenteritis Viral gastroenteritis is also known as stomach flu. This condition affects the stomach and intestinal tract. It can cause sudden diarrhea and vomiting. The illness typically lasts 3 to 8 days. Most people develop an immune response that eventually gets rid of the virus. While this natural response develops, the virus can make you quite ill. CAUSES  Many different viruses can cause gastroenteritis, such as rotavirus or noroviruses. You can catch one of these viruses by consuming contaminated food or water. You may also catch a virus by sharing utensils or other personal items with an infected person or by touching a contaminated surface. SYMPTOMS  The most common symptoms are diarrhea and vomiting. These problems can cause a severe loss of body fluids (dehydration) and a body salt (electrolyte) imbalance. Other symptoms may include:  Fever.  Headache.  Fatigue.  Abdominal pain. DIAGNOSIS  Your caregiver can usually diagnose viral gastroenteritis based on your symptoms and a physical exam. A stool sample may also be taken to test for the presence of viruses or other infections. TREATMENT  This illness typically goes away on its own. Treatments are aimed at rehydration. The most serious cases of viral gastroenteritis involve vomiting so severely that you are not able to keep fluids down. In these cases, fluids must be given through an intravenous line  (IV). HOME CARE INSTRUCTIONS   Drink enough fluids to keep your urine clear or pale yellow. Drink small amounts of fluids frequently and increase the amounts as tolerated.  Ask your caregiver for specific rehydration instructions.  Avoid:  Foods high in sugar.  Alcohol.  Carbonated drinks.  Tobacco.  Juice.  Caffeine drinks.  Extremely hot or cold fluids.  Fatty, greasy foods.  Too much intake of anything at one time.  Dairy products until 24 to 48 hours after diarrhea stops.  You may consume probiotics. Probiotics are active cultures of beneficial bacteria. They may lessen the amount and number of diarrheal stools in adults. Probiotics can be found in yogurt with active cultures and in supplements.  Wash your hands well to avoid spreading the virus.  Only take over-the-counter or prescription medicines for pain, discomfort, or fever as directed by your caregiver. Do not give aspirin to children. Antidiarrheal medicines are not recommended.  Ask your caregiver if you should continue to take your regular prescribed and over-the-counter medicines.  Keep all follow-up appointments as directed by your caregiver. SEEK IMMEDIATE MEDICAL CARE IF:   You are unable to keep fluids down.  You do not urinate at least once every 6 to 8 hours.  You develop shortness of breath.  You notice blood in your stool or vomit. This may look like coffee grounds.  You have abdominal pain that increases or is concentrated in one small area (localized).  You have persistent vomiting or diarrhea.  You have a fever.  The patient is a child younger than 3 months, and he or she has a fever.  The patient is a child  older than 3 months, and he or she has a fever and persistent symptoms.  The patient is a child older than 3 months, and he or she has a fever and symptoms suddenly get worse.  The patient is a baby, and he or she has no tears when crying. MAKE SURE YOU:   Understand these  instructions.  Will watch your condition.  Will get help right away if you are not doing well or get worse. Document Released: 06/09/2005 Document Revised: 09/01/2011 Document Reviewed: 03/26/2011 [facility] Patient Information 2015 [place], [place]. This information is not intended to replace advice given to you by your health care provider. Make sure you discuss any questions you have with your health care provider.

## 2015-01-12 ENCOUNTER — Telehealth: Payer: Self-pay | Admitting: Family

## 2015-01-12 NOTE — Telephone Encounter (Signed)
LVM for pt to call back.

## 2015-01-12 NOTE — Telephone Encounter (Signed)
Please inform patient that her potassium is slightly low, which is consistent with gastroenteritis and should resolve with some sports drinks and without medication as her nutrition improves. If it does not, have her follow up.

## 2015-01-15 NOTE — Telephone Encounter (Signed)
Called pt back. VM was full

## 2015-01-15 NOTE — Telephone Encounter (Signed)
Patient need a letter to go back to work tomorrow for being out with the stomach bug., and lab results, please advise

## 2015-01-16 ENCOUNTER — Emergency Department (HOSPITAL_COMMUNITY)
Admission: EM | Admit: 2015-01-16 | Discharge: 2015-01-16 | Disposition: A | Discharge status: Home / Self Care | Payer: 59 | Attending: Emergency Medicine | Admitting: Emergency Medicine

## 2015-01-16 ENCOUNTER — Encounter (HOSPITAL_COMMUNITY): Payer: Self-pay | Admitting: Emergency Medicine

## 2015-01-16 DIAGNOSIS — R112 Nausea with vomiting, unspecified: Secondary | ICD-10-CM

## 2015-01-16 DIAGNOSIS — R109 Unspecified abdominal pain: Secondary | ICD-10-CM | POA: Diagnosis present

## 2015-01-16 DIAGNOSIS — Z8639 Personal history of other endocrine, nutritional and metabolic disease: Secondary | ICD-10-CM | POA: Diagnosis not present

## 2015-01-16 DIAGNOSIS — Z8781 Personal history of (healed) traumatic fracture: Secondary | ICD-10-CM | POA: Insufficient documentation

## 2015-01-16 DIAGNOSIS — I429 Cardiomyopathy, unspecified: Secondary | ICD-10-CM | POA: Diagnosis not present

## 2015-01-16 DIAGNOSIS — R634 Abnormal weight loss: Secondary | ICD-10-CM | POA: Diagnosis not present

## 2015-01-16 DIAGNOSIS — F10229 Alcohol dependence with intoxication, unspecified: Secondary | ICD-10-CM | POA: Diagnosis not present

## 2015-01-16 DIAGNOSIS — R1013 Epigastric pain: Secondary | ICD-10-CM | POA: Diagnosis not present

## 2015-01-16 DIAGNOSIS — R197 Diarrhea, unspecified: Secondary | ICD-10-CM | POA: Insufficient documentation

## 2015-01-16 DIAGNOSIS — F419 Anxiety disorder, unspecified: Secondary | ICD-10-CM | POA: Insufficient documentation

## 2015-01-16 DIAGNOSIS — I119 Hypertensive heart disease without heart failure: Secondary | ICD-10-CM | POA: Insufficient documentation

## 2015-01-16 DIAGNOSIS — Z3202 Encounter for pregnancy test, result negative: Secondary | ICD-10-CM | POA: Insufficient documentation

## 2015-01-16 DIAGNOSIS — E876 Hypokalemia: Secondary | ICD-10-CM | POA: Diagnosis not present

## 2015-01-16 DIAGNOSIS — Z79899 Other long term (current) drug therapy: Secondary | ICD-10-CM | POA: Insufficient documentation

## 2015-01-16 DIAGNOSIS — Z87891 Personal history of nicotine dependence: Secondary | ICD-10-CM | POA: Diagnosis not present

## 2015-01-16 DIAGNOSIS — F329 Major depressive disorder, single episode, unspecified: Secondary | ICD-10-CM | POA: Diagnosis not present

## 2015-01-16 DIAGNOSIS — R630 Anorexia: Secondary | ICD-10-CM | POA: Insufficient documentation

## 2015-01-16 LAB — URINE MICROSCOPIC-ADD ON

## 2015-01-16 LAB — URINALYSIS, ROUTINE W REFLEX MICROSCOPIC
Bilirubin Urine: NEGATIVE
Glucose, UA: NEGATIVE mg/dL
KETONES UR: NEGATIVE mg/dL
Leukocytes, UA: NEGATIVE
NITRITE: NEGATIVE
Protein, ur: 30 mg/dL — AB
Specific Gravity, Urine: 1.02 (ref 1.005–1.030)
Urobilinogen, UA: 0.2 mg/dL (ref 0.0–1.0)
pH: 6 (ref 5.0–8.0)

## 2015-01-16 LAB — COMPREHENSIVE METABOLIC PANEL
ALT: 23 U/L (ref 14–54)
AST: 28 U/L (ref 15–41)
Albumin: 3.5 g/dL (ref 3.5–5.0)
Alkaline Phosphatase: 91 U/L (ref 38–126)
Anion gap: 9 (ref 5–15)
BUN: 8 mg/dL (ref 6–20)
CO2: 25 mmol/L (ref 22–32)
Calcium: 8 mg/dL — ABNORMAL LOW (ref 8.9–10.3)
Chloride: 110 mmol/L (ref 101–111)
Creatinine, Ser: 0.51 mg/dL (ref 0.44–1.00)
GFR calc Af Amer: >60 mL/min (ref >60)
GFR calc non Af Amer: >60 mL/min (ref >60)
GLUCOSE: 91 mg/dL (ref 65–99)
POTASSIUM: 2.8 mmol/L — AB (ref 3.5–5.1)
Sodium: 144 mmol/L (ref 135–145)
TOTAL PROTEIN: 6.9 g/dL (ref 6.5–8.1)
Total Bilirubin: 0.8 mg/dL (ref 0.3–1.2)

## 2015-01-16 LAB — CBC
HCT: 34.9 % — ABNORMAL LOW (ref 36.0–46.0)
HEMOGLOBIN: 11.9 g/dL — AB (ref 12.0–15.0)
MCH: 29.8 pg (ref 26.0–34.0)
MCHC: 34.1 g/dL (ref 30.0–36.0)
MCV: 87.3 fL (ref 78.0–100.0)
Platelets: 325 10*3/uL (ref 150–400)
RBC: 4 MIL/uL (ref 3.87–5.11)
RDW: 15.8 % — ABNORMAL HIGH (ref 11.5–15.5)
WBC: 10 10*3/uL (ref 4.0–10.5)

## 2015-01-16 LAB — I-STAT BETA HCG BLOOD, ED (MC, WL, AP ONLY)

## 2015-01-16 LAB — MAGNESIUM: Magnesium: 2.1 mg/dL (ref 1.7–2.4)

## 2015-01-16 LAB — LIPASE, BLOOD: Lipase: 21 U/L — ABNORMAL LOW (ref 22–51)

## 2015-01-16 MED ORDER — POTASSIUM CHLORIDE CRYS ER 20 MEQ PO TBCR: 20.0000 meq | EXTENDED_RELEASE_TABLET | Freq: Every day | ORAL | Status: DC

## 2015-01-16 MED ORDER — ONDANSETRON HCL 4 MG/2ML IJ SOLN
4.0000 mg | Freq: Once | INTRAMUSCULAR | Status: AC
  Administered 2015-01-16: 4 mg via INTRAVENOUS
  Filled 2015-01-16: qty 2

## 2015-01-16 MED ORDER — SODIUM CHLORIDE 0.9 % IV BOLUS (SEPSIS)
500.0000 mL | Freq: Once | INTRAVENOUS | Status: AC
  Administered 2015-01-16: 500 mL via INTRAVENOUS

## 2015-01-16 MED ORDER — FENTANYL CITRATE (PF) 100 MCG/2ML IJ SOLN
50.0000 ug | INTRAMUSCULAR | Status: DC | PRN
  Administered 2015-01-16: 50 ug via INTRAVENOUS
  Filled 2015-01-16: qty 2

## 2015-01-16 MED ORDER — POTASSIUM CHLORIDE 10 MEQ/100ML IV SOLN
10.0000 meq | Freq: Once | INTRAVENOUS | Status: AC
  Administered 2015-01-16: 10 meq via INTRAVENOUS
  Filled 2015-01-16: qty 100

## 2015-01-16 MED ORDER — POTASSIUM CHLORIDE CRYS ER 20 MEQ PO TBCR
40.0000 meq | EXTENDED_RELEASE_TABLET | Freq: Once | ORAL | Status: AC
  Administered 2015-01-16: 40 meq via ORAL
  Filled 2015-01-16: qty 2

## 2015-01-16 MED ORDER — ACETAMINOPHEN 500 MG PO TABS
1000.0000 mg | ORAL_TABLET | Freq: Once | ORAL | Status: AC
  Administered 2015-01-16: 1000 mg via ORAL
  Filled 2015-01-16: qty 2

## 2015-01-16 MED ORDER — ONDANSETRON 4 MG PO TBDP: ORAL_TABLET | ORAL | Status: DC

## 2015-01-16 NOTE — ED Provider Notes (Signed)
CSN: [id number]     Arrival date & time 01/16/15  0636 History   First MD Initiated Contact with Patient 01/16/15 0719     Chief Complaint  Patient presents with  . Abdominal Pain  . Alcohol Intoxication     (Consider location/radiation/quality/duration/timing/severity/associated sxs/prior Treatment) HPI Comments: 44 yo female with cardiomyopathy, etoh dependence, SI and depression hx presents with recurrent non bloody vomiting and diarrhea with abd aching/throbbing lower worsening.  Pt ran out of zofran but unable to keep things down with new script.  Recurrent.  8/10 pain. Hx of ulcers on carafate.   Patient is a 44 y.o. female presenting with abdominal pain and intoxication. The history is provided by the patient.  Abdominal Pain Associated symptoms: diarrhea, nausea and vomiting   Associated symptoms: no chest pain, no chills, no cough, no dysuria, no fever and no shortness of breath   Alcohol Intoxication Associated symptoms include abdominal pain. Pertinent negatives include no chest pain, no headaches and no shortness of breath.    Past Medical History  Diagnosis Date  . Foot fracture 01-07-2011  . Hypertensive cardiomyopathy   . Depression   . Hyperlipidemia   . Anxiety   . Alcohol abuse    Past Surgical History  Procedure Laterality Date  . Cesarean section    . Gastric bypass     Family History  Problem Relation Age of Onset  . Hypertension Mother   . Heart disease Mother   . Hyperlipidemia Mother   . Heart disease Father   . Alcohol abuse Brother   . Alcohol abuse Paternal Uncle    History  Substance Use Topics  . Smoking status: Former Smoker -- 0.01 packs/day    Types: Cigarettes  . Smokeless tobacco: Never Used     Comment: 1 Cigarettes every other Day  . Alcohol Use: Yes     Comment: daily use-1 1/2 pints cognac   OB History    No data available     Review of Systems  Constitutional: Positive for appetite change and unexpected weight change (6  lbs loss with decr appetite). Negative for fever and chills.  HENT: Negative for congestion.   Eyes: Negative for visual disturbance.  Respiratory: Negative for cough and shortness of breath.   Cardiovascular: Negative for chest pain.  Gastrointestinal: Positive for nausea, vomiting, abdominal pain and diarrhea. Negative for blood in stool.  Genitourinary: Negative for dysuria and flank pain.  Musculoskeletal: Negative for back pain, neck pain and neck stiffness.  Skin: Negative for rash.  Neurological: Negative for light-headedness and headaches.      Allergies  Suboxone  Home Medications   Prior to Admission medications   Medication Sig Start Date End Date Taking? Authorizing Provider  acamprosate (CAMPRAL) 333 MG tablet Take 333 mg by mouth 3 (three) times daily.  11/07/14  Yes Historical Provider, MD  acetaminophen (TYLENOL) 500 MG tablet Take 1,000 mg by mouth every 6 (six) hours as needed for moderate pain.   Yes Historical Provider, MD  buPROPion (WELLBUTRIN XL) 150 MG 24 hr tablet take 1 tablet by mouth daily 11/24/14  Yes Historical Provider, MD  estazolam (PROSOM) 2 MG tablet take 1 tablet by mouth at bedtime 12/21/14  Yes Historical Provider, MD  ibuprofen (ADVIL,MOTRIN) 200 MG tablet Take 800 mg by mouth every 6 (six) hours as needed for headache or moderate pain.   Yes Historical Provider, MD  lisinopril (PRINIVIL,ZESTRIL) 5 MG tablet Take 1 tablet (5 mg total) by mouth daily. 12/11/14  Yes Golden Circle, FNP  medroxyPROGESTERone (DEPO-PROVERA) 150 MG/ML injection INJECT 1ML Q 3 MONTHS BY INTRAMUSCULAR ROUTE 11/24/14  Yes Historical Provider, MD  ondansetron (ZOFRAN ODT) 4 MG disintegrating tablet Take 1 tablet (4 mg total) by mouth every 8 (eight) hours as needed for nausea or vomiting. 01/11/15  Yes Golden Circle, FNP  pantoprazole (PROTONIX) 40 MG tablet Take 1 tablet (40 mg total) by mouth daily. 11/17/14  Yes Kerrie Buffalo, NP  ramelteon (ROZEREM) 8 MG tablet Take 1  tablet (8 mg total) by mouth at bedtime as needed for sleep. 11/17/14  Yes Kerrie Buffalo, NP  valACYclovir (VALTREX) 500 MG tablet Take 1 tablet by mouth 2 (two) times daily as needed (flare ups).  08/29/14  Yes Historical Provider, MD  buPROPion (WELLBUTRIN XL) 300 MG 24 hr tablet Take 1 tablet (300 mg total) by mouth daily. 12/11/14   Golden Circle, FNP  ondansetron (ZOFRAN ODT) 4 MG disintegrating tablet 4mg  ODT q4 hours prn nausea/vomit 01/16/15   Elnora Morrison, MD  potassium chloride SA (K-DUR,KLOR-CON) 20 MEQ tablet Take 1 tablet (20 mEq total) by mouth daily. 01/16/15   Elnora Morrison, MD   BP 135/96 mmHg  Pulse 106  Temp(Src) 98.1 F (36.7 C) (Oral)  Resp 22  Ht 5\' 3"  (1.6 m)  Wt 159 lb (72.122 kg)  BMI 28.17 kg/m2  SpO2 100% Physical Exam  Constitutional: She is oriented to person, place, and time. She appears well-developed and well-nourished.  HENT:  Head: Normocephalic and atraumatic.  Mild dry mm  Eyes: Conjunctivae are normal. Right eye exhibits no discharge. Left eye exhibits no discharge.  Neck: Normal range of motion. Neck supple. No tracheal deviation present.  Cardiovascular: Normal rate and regular rhythm.   Pulmonary/Chest: Effort normal.  Abdominal: Soft. She exhibits no distension. There is tenderness. There is no guarding.  Musculoskeletal: She exhibits no edema.  Neurological: She is alert and oriented to person, place, and time.  Skin: Skin is warm. No rash noted.  Psychiatric: She has a normal mood and affect.  Nursing note and vitals reviewed.   ED Course  Procedures (including critical care time) Labs Review Labs Reviewed  LIPASE, BLOOD - Abnormal; Notable for the following:    Lipase 21 (*)    All other components within normal limits  COMPREHENSIVE METABOLIC PANEL - Abnormal; Notable for the following:    Potassium 2.8 (*)    Calcium 8.0 (*)    All other components within normal limits  CBC - Abnormal; Notable for the following:    Hemoglobin  11.9 (*)    HCT 34.9 (*)    RDW 15.8 (*)    All other components within normal limits  URINALYSIS, ROUTINE W REFLEX MICROSCOPIC (NOT AT [facility]) - Abnormal; Notable for the following:    Hgb urine dipstick SMALL (*)    Protein, ur 30 (*)    All other components within normal limits  URINE MICROSCOPIC-ADD ON - Abnormal; Notable for the following:    Bacteria, UA FEW (*)    All other components within normal limits  MAGNESIUM  I-STAT BETA HCG BLOOD, ED ([facility], [facility], [facility] ONLY)    Imaging Review No results found.   EKG Interpretation None      MDM   Final diagnoses:  Nausea vomiting and diarrhea  Hypokalemia  Epigastric pain   Patient presents with recurrent vomiting diarrhea abdominal discomfort centrally concern for combination of gastritis from ulcer history/alcohol abuse/possible viral component. Patient has been drinking  alcohol recently. Patient is not clinically intoxicated. Plan for supportive care, no indication for emergent CT scan no peritonitis. Follow-up gastroenterology. Tolerating po, pain improved.  Results and differential diagnosis were discussed with the patient/parent/guardian. Xrays were independently reviewed by myself.  Close follow up outpatient was discussed, comfortable with the plan.   Medications  sodium chloride 0.9 % bolus 500 mL (0 mLs Intravenous Stopped 01/16/15 1020)  ondansetron (ZOFRAN) injection 4 mg (4 mg Intravenous Given 01/16/15 0846)  potassium chloride 10 mEq in 100 mL IVPB (0 mEq Intravenous Stopped 01/16/15 1128)  potassium chloride SA (K-DUR,KLOR-CON) CR tablet 40 mEq (40 mEq Oral Given 01/16/15 0933)  acetaminophen (TYLENOL) tablet 1,000 mg (1,000 mg Oral Given 01/16/15 1027)    Filed Vitals:   01/16/15 0920 01/16/15 1030 01/16/15 1045 01/16/15 1100  BP: 128/95 134/97 132/90 135/96  Pulse: 92 97 100 106  Temp:      TempSrc:      Resp: 16 23 22 22   Height:      Weight:      SpO2: 100% 100% 100% 100%    Final diagnoses:  Nausea  vomiting and diarrhea  Hypokalemia  Epigastric pain         Elnora Morrison, MD 01/16/15 1626

## 2015-01-16 NOTE — ED Notes (Signed)
Per EMS, pt woke up this AM with abdominal pain. Pt with hx of alcoholism, and pt's daughter reports that the pt has been drinking.

## 2015-01-16 NOTE — ED Notes (Signed)
Patient given PO fluids, tolerating well with no emesis.

## 2015-01-16 NOTE — Discharge Instructions (Signed)
If you were given medicines take as directed.  If you are on coumadin or contraceptives realize their levels and effectiveness is altered by many different medicines.  If you have any reaction (rash, tongues swelling, other) to the medicines stop taking and see a physician.    If your blood pressure was elevated in the ER make sure you follow up for management with a primary doctor or return for chest pain, shortness of breath or stroke symptoms.  Please follow up as directed and return to the ER or see a physician for new or worsening symptoms.  Thank you. Filed Vitals:   01/16/15 0702 01/16/15 0800 01/16/15 0920  BP: 108/60 125/76 128/95  Pulse: 97 86 92  Temp: 98.1 F (36.7 C)    TempSrc: Oral    Resp: 17  16  Height: 5\' 3"  (1.6 m)    Weight: 159 lb (72.122 kg)    SpO2: 98% 95% 100%

## 2015-01-17 ENCOUNTER — Encounter (HOSPITAL_COMMUNITY): Payer: Self-pay | Admitting: Emergency Medicine

## 2015-01-17 ENCOUNTER — Ambulatory Visit: Payer: 59 | Admitting: Family

## 2015-01-17 ENCOUNTER — Emergency Department (HOSPITAL_COMMUNITY)
Admission: EM | Admit: 2015-01-17 | Discharge: 2015-01-17 | Disposition: A | Discharge status: Home / Self Care | Payer: 59 | Attending: Emergency Medicine | Admitting: Emergency Medicine

## 2015-01-17 ENCOUNTER — Emergency Department (HOSPITAL_COMMUNITY): Payer: 59

## 2015-01-17 DIAGNOSIS — F419 Anxiety disorder, unspecified: Secondary | ICD-10-CM | POA: Diagnosis not present

## 2015-01-17 DIAGNOSIS — Z87891 Personal history of nicotine dependence: Secondary | ICD-10-CM | POA: Insufficient documentation

## 2015-01-17 DIAGNOSIS — Z8781 Personal history of (healed) traumatic fracture: Secondary | ICD-10-CM | POA: Insufficient documentation

## 2015-01-17 DIAGNOSIS — Z8639 Personal history of other endocrine, nutritional and metabolic disease: Secondary | ICD-10-CM | POA: Diagnosis not present

## 2015-01-17 DIAGNOSIS — I119 Hypertensive heart disease without heart failure: Secondary | ICD-10-CM | POA: Insufficient documentation

## 2015-01-17 DIAGNOSIS — R1013 Epigastric pain: Secondary | ICD-10-CM | POA: Diagnosis not present

## 2015-01-17 DIAGNOSIS — R112 Nausea with vomiting, unspecified: Secondary | ICD-10-CM | POA: Diagnosis present

## 2015-01-17 DIAGNOSIS — R197 Diarrhea, unspecified: Secondary | ICD-10-CM | POA: Diagnosis not present

## 2015-01-17 DIAGNOSIS — Z79899 Other long term (current) drug therapy: Secondary | ICD-10-CM | POA: Insufficient documentation

## 2015-01-17 DIAGNOSIS — R1011 Right upper quadrant pain: Secondary | ICD-10-CM | POA: Diagnosis not present

## 2015-01-17 DIAGNOSIS — R109 Unspecified abdominal pain: Secondary | ICD-10-CM

## 2015-01-17 LAB — I-STAT CHEM 8, ED
BUN: 7 mg/dL (ref 6–20)
CALCIUM ION: 1.04 mmol/L — AB (ref 1.12–1.23)
Chloride: 110 mmol/L (ref 101–111)
Creatinine, Ser: 0.9 mg/dL (ref 0.44–1.00)
Glucose, Bld: 92 mg/dL (ref 65–99)
HEMATOCRIT: 38 % (ref 36.0–46.0)
HEMOGLOBIN: 12.9 g/dL (ref 12.0–15.0)
POTASSIUM: 3.4 mmol/L — AB (ref 3.5–5.1)
SODIUM: 149 mmol/L — AB (ref 135–145)
TCO2: 22 mmol/L (ref 0–100)

## 2015-01-17 LAB — CBC WITH DIFFERENTIAL/PLATELET
BASOS ABS: 0.1 10*3/uL (ref 0.0–0.1)
Basophils Relative: 1 % (ref 0–1)
Eosinophils Absolute: 0.2 10*3/uL (ref 0.0–0.7)
Eosinophils Relative: 3 % (ref 0–5)
HEMATOCRIT: 34.4 % — AB (ref 36.0–46.0)
Hemoglobin: 11.6 g/dL — ABNORMAL LOW (ref 12.0–15.0)
LYMPHS ABS: 3.2 10*3/uL (ref 0.7–4.0)
Lymphocytes Relative: 46 % (ref 12–46)
MCH: 29.7 pg (ref 26.0–34.0)
MCHC: 33.7 g/dL (ref 30.0–36.0)
MCV: 88.2 fL (ref 78.0–100.0)
MONO ABS: 0.4 10*3/uL (ref 0.1–1.0)
MONOS PCT: 6 % (ref 3–12)
Neutro Abs: 3 10*3/uL (ref 1.7–7.7)
Neutrophils Relative %: 44 % (ref 43–77)
PLATELETS: 301 10*3/uL (ref 150–400)
RBC: 3.9 MIL/uL (ref 3.87–5.11)
RDW: 15.9 % — AB (ref 11.5–15.5)
WBC: 6.9 10*3/uL (ref 4.0–10.5)

## 2015-01-17 LAB — COMPREHENSIVE METABOLIC PANEL
ALT: 22 U/L (ref 14–54)
AST: 31 U/L (ref 15–41)
Albumin: 3.5 g/dL (ref 3.5–5.0)
Alkaline Phosphatase: 84 U/L (ref 38–126)
Anion gap: 10 (ref 5–15)
BUN: 7 mg/dL (ref 6–20)
CHLORIDE: 109 mmol/L (ref 101–111)
CO2: 22 mmol/L (ref 22–32)
CREATININE: 0.51 mg/dL (ref 0.44–1.00)
Calcium: 7.8 mg/dL — ABNORMAL LOW (ref 8.9–10.3)
GFR calc Af Amer: >60 mL/min (ref >60)
GFR calc non Af Amer: >60 mL/min (ref >60)
GLUCOSE: 86 mg/dL (ref 65–99)
POTASSIUM: 3.2 mmol/L — AB (ref 3.5–5.1)
SODIUM: 141 mmol/L (ref 135–145)
TOTAL PROTEIN: 6.8 g/dL (ref 6.5–8.1)
Total Bilirubin: 0.6 mg/dL (ref 0.3–1.2)

## 2015-01-17 LAB — LIPASE, BLOOD: Lipase: 20 U/L — ABNORMAL LOW (ref 22–51)

## 2015-01-17 MED ORDER — ONDANSETRON 4 MG PO TBDP
8.0000 mg | ORAL_TABLET | Freq: Once | ORAL | Status: AC
  Administered 2015-01-17: 8 mg via ORAL
  Filled 2015-01-17: qty 2

## 2015-01-17 MED ORDER — PROMETHAZINE HCL 25 MG PO TABS: 25.0000 mg | ORAL_TABLET | Freq: Four times a day (QID) | ORAL | Status: DC | PRN

## 2015-01-17 MED ORDER — KETOROLAC TROMETHAMINE 30 MG/ML IJ SOLN
30.0000 mg | Freq: Once | INTRAMUSCULAR | Status: AC
Start: 2015-01-17 — End: 2015-01-17
  Administered 2015-01-17: 30 mg via INTRAVENOUS
  Filled 2015-01-17: qty 1

## 2015-01-17 MED ORDER — PROMETHAZINE HCL 25 MG/ML IJ SOLN
25.0000 mg | Freq: Once | INTRAMUSCULAR | Status: AC
  Administered 2015-01-17: 25 mg via INTRAVENOUS
  Filled 2015-01-17: qty 1

## 2015-01-17 MED ORDER — SODIUM CHLORIDE 0.9 % IV BOLUS (SEPSIS)
1000.0000 mL | Freq: Once | INTRAVENOUS | Status: AC
  Administered 2015-01-17: 1000 mL via INTRAVENOUS

## 2015-01-17 MED ORDER — PANTOPRAZOLE SODIUM 40 MG IV SOLR
40.0000 mg | INTRAVENOUS | Status: AC
  Administered 2015-01-17: 40 mg via INTRAVENOUS
  Filled 2015-01-17: qty 40

## 2015-01-17 NOTE — ED Provider Notes (Signed)
CSN: [id number]     Arrival date & time 01/17/15  0401 History   First MD Initiated Contact with Patient 01/17/15 [phone]     Chief Complaint  Patient presents with  . Nausea  . Emesis     (Consider location/radiation/quality/duration/timing/severity/associated sxs/prior Treatment) HPI Comments: Patient is a 44 year old female with a past medical history of alcohol abuse, hypertensive cardiomyopathy, and PUD who presents with abdominal pain that started 2 days ago. The pain is located in her epigastrium and does not radiate. The pain is described as aching and severe. The pain started gradually and progressively worsened since the onset. No alleviating/aggravating factors. The patient has tried ODT zofran for symptoms without relief. Associated symptoms include nausea, vomiting and mucoid diarrhea. Patient denies fever, headache, chest pain, SOB, dysuria, constipation, abnormal vaginal bleeding/discharge.      Past Medical History  Diagnosis Date  . Foot fracture 01-07-2011  . Hypertensive cardiomyopathy   . Depression   . Hyperlipidemia   . Anxiety   . Alcohol abuse    Past Surgical History  Procedure Laterality Date  . Cesarean section    . Gastric bypass     Family History  Problem Relation Age of Onset  . Hypertension Mother   . Heart disease Mother   . Hyperlipidemia Mother   . Heart disease Father   . Alcohol abuse Brother   . Alcohol abuse Paternal Uncle    History  Substance Use Topics  . Smoking status: Former Smoker -- 0.01 packs/day    Types: Cigarettes  . Smokeless tobacco: Never Used     Comment: 1 Cigarettes every other Day  . Alcohol Use: Yes     Comment: daily use-1 1/2 pints cognac   OB History    No data available     Review of Systems  Constitutional: Negative for fever, chills and fatigue.  HENT: Negative for trouble swallowing.   Eyes: Negative for visual disturbance.  Respiratory: Negative for shortness of breath.   Cardiovascular: Negative  for chest pain and palpitations.  Gastrointestinal: Positive for nausea, vomiting, abdominal pain and diarrhea.  Genitourinary: Negative for dysuria and difficulty urinating.  Musculoskeletal: Negative for arthralgias and neck pain.  Skin: Negative for color change.  Neurological: Negative for dizziness and weakness.  Psychiatric/Behavioral: Negative for dysphoric mood.      Allergies  Suboxone  Home Medications   Prior to Admission medications   Medication Sig Start Date End Date Taking? Authorizing Provider  acamprosate (CAMPRAL) 333 MG tablet Take 333 mg by mouth 3 (three) times daily.  11/07/14   Historical Provider, MD  acetaminophen (TYLENOL) 500 MG tablet Take 1,000 mg by mouth every 6 (six) hours as needed for moderate pain.    Historical Provider, MD  buPROPion (WELLBUTRIN XL) 150 MG 24 hr tablet take 1 tablet by mouth daily 11/24/14   Historical Provider, MD  buPROPion (WELLBUTRIN XL) 300 MG 24 hr tablet Take 1 tablet (300 mg total) by mouth daily. 12/11/14   Golden Circle, FNP  estazolam (PROSOM) 2 MG tablet take 1 tablet by mouth at bedtime 12/21/14   Historical Provider, MD  ibuprofen (ADVIL,MOTRIN) 200 MG tablet Take 800 mg by mouth every 6 (six) hours as needed for headache or moderate pain.    Historical Provider, MD  lisinopril (PRINIVIL,ZESTRIL) 5 MG tablet Take 1 tablet (5 mg total) by mouth daily. 12/11/14   Golden Circle, FNP  medroxyPROGESTERone (DEPO-PROVERA) 150 MG/ML injection INJECT 1ML Q 3 MONTHS BY  INTRAMUSCULAR ROUTE 11/24/14   Historical Provider, MD  ondansetron (ZOFRAN ODT) 4 MG disintegrating tablet Take 1 tablet (4 mg total) by mouth every 8 (eight) hours as needed for nausea or vomiting. 01/11/15   Golden Circle, FNP  ondansetron (ZOFRAN ODT) 4 MG disintegrating tablet 4mg  ODT q4 hours prn nausea/vomit 01/16/15   Elnora Morrison, MD  pantoprazole (PROTONIX) 40 MG tablet Take 1 tablet (40 mg total) by mouth daily. 11/17/14   Kerrie Buffalo, NP  potassium  chloride SA (K-DUR,KLOR-CON) 20 MEQ tablet Take 1 tablet (20 mEq total) by mouth daily. 01/16/15   Elnora Morrison, MD  ramelteon (ROZEREM) 8 MG tablet Take 1 tablet (8 mg total) by mouth at bedtime as needed for sleep. 11/17/14   Kerrie Buffalo, NP  valACYclovir (VALTREX) 500 MG tablet Take 1 tablet by mouth 2 (two) times daily as needed (flare ups).  08/29/14   Historical Provider, MD   BP 112/69 mmHg  Pulse 95  Temp(Src) 98.7 F (37.1 C) (Oral)  Resp 17  Ht 5\' 3"  (1.6 m)  Wt 159 lb (72.122 kg)  BMI 28.17 kg/m2  SpO2 98% Physical Exam  Constitutional: She is oriented to person, place, and time. She appears well-developed and well-nourished. No distress.  HENT:  Head: Normocephalic and atraumatic.  Eyes: Conjunctivae and EOM are normal.  Neck: Normal range of motion.  Cardiovascular: Normal rate and regular rhythm.  Exam reveals no gallop and no friction rub.   No murmur heard. Pulmonary/Chest: Effort normal and breath sounds normal. She has no wheezes. She has no rales. She exhibits no tenderness.  Abdominal: Soft. She exhibits no distension. There is tenderness. There is no rebound and no guarding.  Epigastric and RUQ tenderness to palpation. No other focal tenderness or peritoneal signs.  Musculoskeletal: Normal range of motion.  Neurological: She is alert and oriented to person, place, and time. Coordination normal.  Speech is goal-oriented. Moves limbs without ataxia.   Skin: Skin is warm and dry.  Psychiatric: She has a normal mood and affect. Her behavior is normal.  Nursing note and vitals reviewed.   ED Course  Procedures (including critical care time) Labs Review Labs Reviewed  CBC WITH DIFFERENTIAL/PLATELET - Abnormal; Notable for the following:    Hemoglobin 11.6 (*)    HCT 34.4 (*)    RDW 15.9 (*)    All other components within normal limits  COMPREHENSIVE METABOLIC PANEL - Abnormal; Notable for the following:    Potassium 3.2 (*)    Calcium 7.8 (*)    All other  components within normal limits  LIPASE, BLOOD - Abnormal; Notable for the following:    Lipase 20 (*)    All other components within normal limits  I-STAT CHEM 8, ED - Abnormal; Notable for the following:    Sodium 149 (*)    Potassium 3.4 (*)    Calcium, Ion 1.04 (*)    All other components within normal limits    Imaging Review Ct Renal Stone Study  01/17/2015   CLINICAL DATA:  Abdominal pain, mostly lower abdominal pain. Recurrent bloody vomiting and diarrhea. History of ulcers. ETOH dependent.  EXAM: CT ABDOMEN AND PELVIS WITHOUT CONTRAST  TECHNIQUE: Multidetector CT imaging of the abdomen and pelvis was performed following the standard protocol without IV contrast.  COMPARISON:  None.  FINDINGS: Gallbladder is moderately distended but there is no pericholecystic fluid or other secondary signs of an acute cholecystitis. No bile duct dilatation. No bile duct stone seen.  Small  low density focus within the left hepatic lobe is most likely focal fatty infiltration. Liver is otherwise within normal limits for a noncontrast study. Spleen, pancreas, and adrenal glands appear normal. Kidneys are unremarkable without stone or hydronephrosis. No ureteral or bladder calculi identified. Bladder appears normal. No bladder stone.  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 The excluded portion of the stomach is mildly distended with fluid but otherwise unremarkable. Remainder of the bowel is normal in caliber and configuration. No bowel wall thickening or evidence of bowel wall inflammation.  Appendix is not convincingly seen and there is patient motion artifact which limits characterization of the right lower quadrant, but there are no inflammatory changes or free fluid about the cecum to suggest acute appendicitis.  No free fluid or abscess collections seen. No free intraperitoneal air. No enlarged lymph nodes. Abdominal aorta is normal in caliber. Lung bases are clear. No significant osseous  abnormality.  IMPRESSION: 1. Gallbladder is moderately distended but otherwise unremarkable. No pericholecystic fluid or other secondary signs of an acute cholecystitis. No calcified gallstones seen. No biliary dilatation. If any localizable right upper quadrant pain, would consider right upper quadrant ultrasound for further characterization of this gallbladder distention. 2. Surgical changes of gastric bypass. No evidence of surgical complicating feature. 3. Remainder of the abdomen and pelvis is unremarkable. No free fluid or inflammatory change. No bowel obstruction. No renal or ureteral calculi. 4. Right lower quadrant is difficult to evaluate due to patient motion artifact but there are no secondary signs of acute appendicitis. If clinically concerned for an acute appendicitis, would recommend repeat CT examination of the pelvis with oral and intravascular contrast.   Electronically Signed   By: Franki Cabot M.D.   On: 01/17/2015 11:16     EKG Interpretation   Date/Time:  Wednesday January 17 2015 04:39:05 EDT Ventricular Rate:  100 PR Interval:  129 QRS Duration: 83 QT Interval:  384 QTC Calculation: 495 R Axis:   62 Text Interpretation:  Sinus tachycardia Atrial premature complexes in  couplets Abnormal R-wave progression, early transition Borderline  prolonged QT interval No significant change since last tracing Confirmed  by Christy Gentles  MD, DONALD ([id number]) on 01/17/2015 4:52:15 AM      MDM   Final diagnoses:  Abdominal pain    6:54 AM Labs pending. Patient will have labs, fluids, phenergan and protonix for symptoms. Vitals stable and patient afebrile.   11:53 AM Patient's labs unchanged from yesterday. CT abdomen pelvis shows no acute changes. Patient will be discharged with GI follow up.      Alvina Chou, PA-C 01/17/15 1154  Elnora Morrison, MD 01/20/15 2351

## 2015-01-17 NOTE — Discharge Instructions (Signed)
Take phenergan as needed for nausea. Follow up with [facility] for further evaluation. Refer to attached documents for more information. Return to the ED with worsening or concerning symptoms.

## 2015-01-17 NOTE — ED Notes (Signed)
Pt currently vomiting after po fluids. Also having diarrhea, green in color.

## 2015-01-17 NOTE — ED Notes (Signed)
PT in EMS from home. D/C yesterday dx of hypokalemia, N/V.  Pt has not been able to keep anything down since being D/C

## 2015-01-17 NOTE — ED Notes (Signed)
Pt attempting to drink ginger ale at current time.

## 2015-01-17 NOTE — ED Notes (Signed)
Pt verbalizes understanding of discharge instructions. A/O x4 on departure. Ambulatory with steady gait. VSS.

## 2015-01-18 ENCOUNTER — Ambulatory Visit: Payer: Self-pay | Admitting: Family

## 2015-01-18 ENCOUNTER — Ambulatory Visit: Payer: 59 | Admitting: Family

## 2015-01-26 ENCOUNTER — Emergency Department (HOSPITAL_COMMUNITY)
Admission: EM | Admit: 2015-01-26 | Discharge: 2015-01-27 | Disposition: A | Discharge status: Home / Self Care | Payer: 59 | Attending: Emergency Medicine | Admitting: Emergency Medicine

## 2015-01-26 ENCOUNTER — Encounter (HOSPITAL_COMMUNITY): Payer: Self-pay

## 2015-01-26 DIAGNOSIS — I10 Essential (primary) hypertension: Secondary | ICD-10-CM | POA: Insufficient documentation

## 2015-01-26 DIAGNOSIS — Z8639 Personal history of other endocrine, nutritional and metabolic disease: Secondary | ICD-10-CM | POA: Insufficient documentation

## 2015-01-26 DIAGNOSIS — F1310 Sedative, hypnotic or anxiolytic abuse, uncomplicated: Secondary | ICD-10-CM | POA: Insufficient documentation

## 2015-01-26 DIAGNOSIS — S0031XA Abrasion of nose, initial encounter: Secondary | ICD-10-CM | POA: Diagnosis not present

## 2015-01-26 DIAGNOSIS — Y998 Other external cause status: Secondary | ICD-10-CM | POA: Diagnosis not present

## 2015-01-26 DIAGNOSIS — F419 Anxiety disorder, unspecified: Secondary | ICD-10-CM | POA: Diagnosis not present

## 2015-01-26 DIAGNOSIS — X58XXXA Exposure to other specified factors, initial encounter: Secondary | ICD-10-CM | POA: Diagnosis not present

## 2015-01-26 DIAGNOSIS — F10920 Alcohol use, unspecified with intoxication, uncomplicated: Secondary | ICD-10-CM

## 2015-01-26 DIAGNOSIS — Z79899 Other long term (current) drug therapy: Secondary | ICD-10-CM | POA: Diagnosis not present

## 2015-01-26 DIAGNOSIS — Y9289 Other specified places as the place of occurrence of the external cause: Secondary | ICD-10-CM | POA: Diagnosis not present

## 2015-01-26 DIAGNOSIS — Y9389 Activity, other specified: Secondary | ICD-10-CM | POA: Diagnosis not present

## 2015-01-26 DIAGNOSIS — F10120 Alcohol abuse with intoxication, uncomplicated: Secondary | ICD-10-CM | POA: Insufficient documentation

## 2015-01-26 DIAGNOSIS — Z87891 Personal history of nicotine dependence: Secondary | ICD-10-CM | POA: Diagnosis not present

## 2015-01-26 DIAGNOSIS — Z8781 Personal history of (healed) traumatic fracture: Secondary | ICD-10-CM | POA: Diagnosis not present

## 2015-01-26 DIAGNOSIS — F329 Major depressive disorder, single episode, unspecified: Secondary | ICD-10-CM | POA: Insufficient documentation

## 2015-01-26 LAB — CBC
HCT: 34.6 % — ABNORMAL LOW (ref 36.0–46.0)
Hemoglobin: 11.5 g/dL — ABNORMAL LOW (ref 12.0–15.0)
MCH: 29.6 pg (ref 26.0–34.0)
MCHC: 33.2 g/dL (ref 30.0–36.0)
MCV: 89.2 fL (ref 78.0–100.0)
Platelets: 319 10*3/uL (ref 150–400)
RBC: 3.88 MIL/uL (ref 3.87–5.11)
RDW: 16.2 % — ABNORMAL HIGH (ref 11.5–15.5)
WBC: 4.6 10*3/uL (ref 4.0–10.5)

## 2015-01-26 NOTE — ED Notes (Signed)
Patient arrived via EMS and [employer] due to IVC paperwork r/t alcohol abuse.  Per IVC paperwork she has recently expressed suicidal ideation.  History of ETOH and prescription drug abuse.  Currently intoxicated.  Haldol 5mg  IM administered en route to hospital.

## 2015-01-26 NOTE — ED Provider Notes (Signed)
CSN: [id number]     Arrival date & time 01/26/15  2259 History   This chart was scribed for Virgel Manifold, MD by [name], ED Scribe. This patient was seen in room WA20/WA20 and the patient's care was started 11:30 PM.   Chief Complaint  Patient presents with  . Alcohol Problem   The history is provided by the patient. No language interpreter was used.    LEVEL 5 CAVEAT DUE TO ALCOHOL INTOXICATION   HPI Comments: Shannon Cervantes brought in by EMS and [employer] is a 44 y.o. female with a PMHx of alcohol abuse, anxiety, and depression who presents to the Emergency Department here for an alcohol problem this evening. Per [employer], IVC papers were taken out on pt today for ongoing alcohol abuse. Per IVC papers, she has recently expressed suicidal ideation while intoxicated. 5 mg IM of Haldol administered en route to department. No recent falls or injury. She denies any SI/HI at this time. Denies any pain at this time. No complaints at the moment. Pt with known allergy to Suboxone.  Past Medical History  Diagnosis Date  . Foot fracture 01-07-2011  . Hypertensive cardiomyopathy   . Depression   . Hyperlipidemia   . Anxiety   . Alcohol abuse    Past Surgical History  Procedure Laterality Date  . Cesarean section    . Gastric bypass     Family History  Problem Relation Age of Onset  . Hypertension Mother   . Heart disease Mother   . Hyperlipidemia Mother   . Heart disease Father   . Alcohol abuse Brother   . Alcohol abuse Paternal Uncle    History  Substance Use Topics  . Smoking status: Former Smoker -- 0.01 packs/day    Types: Cigarettes  . Smokeless tobacco: Never Used     Comment: 1 Cigarettes every other Day  . Alcohol Use: Yes     Comment: daily use-1 1/2 pints cognac   OB History    No data available     Review of Systems  Unable to perform ROS: Other      Allergies  Suboxone  Home Medications   Prior to Admission medications   Medication Sig Start Date End Date  Taking? Authorizing Provider  acamprosate (CAMPRAL) 333 MG tablet Take 333 mg by mouth 3 (three) times daily.  11/07/14   Historical Provider, MD  acetaminophen (TYLENOL) 500 MG tablet Take 1,000 mg by mouth every 6 (six) hours as needed for moderate pain.    Historical Provider, MD  buPROPion (WELLBUTRIN XL) 150 MG 24 hr tablet take 1 tablet by mouth daily 11/24/14   Historical Provider, MD  buPROPion (WELLBUTRIN XL) 300 MG 24 hr tablet Take 1 tablet (300 mg total) by mouth daily. 12/11/14   Golden Circle, FNP  estazolam (PROSOM) 2 MG tablet take 1 tablet by mouth at bedtime 12/21/14   Historical Provider, MD  ibuprofen (ADVIL,MOTRIN) 200 MG tablet Take 800 mg by mouth every 6 (six) hours as needed for headache or moderate pain.    Historical Provider, MD  lisinopril (PRINIVIL,ZESTRIL) 5 MG tablet Take 1 tablet (5 mg total) by mouth daily. 12/11/14   Golden Circle, FNP  medroxyPROGESTERone (DEPO-PROVERA) 150 MG/ML injection INJECT 1ML Q 3 MONTHS BY INTRAMUSCULAR ROUTE 11/24/14   Historical Provider, MD  ondansetron (ZOFRAN ODT) 4 MG disintegrating tablet Take 1 tablet (4 mg total) by mouth every 8 (eight) hours as needed for nausea or vomiting. 01/11/15  Golden Circle, FNP  ondansetron (ZOFRAN ODT) 4 MG disintegrating tablet 4mg  ODT q4 hours prn nausea/vomit 01/16/15   Elnora Morrison, MD  pantoprazole (PROTONIX) 40 MG tablet Take 1 tablet (40 mg total) by mouth daily. 11/17/14   Kerrie Buffalo, NP  potassium chloride SA (K-DUR,KLOR-CON) 20 MEQ tablet Take 1 tablet (20 mEq total) by mouth daily. 01/16/15   Elnora Morrison, MD  promethazine (PHENERGAN) 25 MG tablet Take 1 tablet (25 mg total) by mouth every 6 (six) hours as needed for nausea or vomiting. 01/17/15   Alvina Chou, PA-C  ramelteon (ROZEREM) 8 MG tablet Take 1 tablet (8 mg total) by mouth at bedtime as needed for sleep. 11/17/14   Kerrie Buffalo, NP  valACYclovir (VALTREX) 500 MG tablet Take 1 tablet by mouth 2 (two) times daily as needed  (flare ups).  08/29/14   Historical Provider, MD   Triage Vitals: BP 128/83 mmHg  Pulse 106  Temp(Src) 98.5 F (36.9 C) (Oral)  Resp 16  SpO2 96%   Physical Exam  Constitutional: She appears well-developed and well-nourished. No distress.  Drowsy and intoxicated but follows commands  HENT:  Head: Normocephalic.  Subacute appearing abrasion to bridge of nose.   Eyes: EOM are normal.  Neck: Normal range of motion.  Cardiovascular: Normal rate, regular rhythm and normal heart sounds.   Pulmonary/Chest: Effort normal.  Abdominal: She exhibits no distension.  Musculoskeletal: Normal range of motion.  Neurological:  General psychomotor slowing consistent with alcohol intoxication. No focal deficit.   Psychiatric: Her speech is slurred. She is slowed.  Nursing note and vitals reviewed.   ED Course  Procedures (including critical care time)  DIAGNOSTIC STUDIES: Oxygen Saturation is 96% on RA, adequate by my interpretation.    COORDINATION OF CARE: 11:38 PM-Discussed treatment plan with pt at bedside and pt agreed to plan.     Labs Review Labs Reviewed  COMPREHENSIVE METABOLIC PANEL - Abnormal; Notable for the following:    Potassium 3.4 (*)    Chloride 115 (*)    Calcium 7.9 (*)    All other components within normal limits  ETHANOL - Abnormal; Notable for the following:    Alcohol, Ethyl (B) 417 (*)    All other components within normal limits  ACETAMINOPHEN LEVEL - Abnormal; Notable for the following:    Acetaminophen (Tylenol), Serum <10 (*)    All other components within normal limits  CBC - Abnormal; Notable for the following:    Hemoglobin 11.5 (*)    HCT 34.6 (*)    RDW 16.2 (*)    All other components within normal limits  URINE RAPID DRUG SCREEN, HOSP PERFORMED - Abnormal; Notable for the following:    Benzodiazepines POSITIVE (*)    All other components within normal limits  SALICYLATE LEVEL    Imaging Review No results found.   EKG Interpretation None       MDM   Final diagnoses:  Alcohol intoxication, uncomplicated    20UR with IVC paperwork essentially saying she is danger to herself because she is an alcoholic and has mentioned suicide "in the past month." Pt denies suicidal ideation to me. She is currently heavily intoxicated. I have rescinded IVC at this point. Can be discharged if someone can pick her up. If not, observe until clinically sober enough to be discharged herself.  I personally preformed the services scribed in my presence. The recorded information has been reviewed is accurate. Virgel Manifold, MD.   Virgel Manifold, MD 01/28/15 2053

## 2015-01-27 LAB — COMPREHENSIVE METABOLIC PANEL
ALT: 24 U/L (ref 14–54)
AST: 37 U/L (ref 15–41)
Albumin: 4 g/dL (ref 3.5–5.0)
Alkaline Phosphatase: 96 U/L (ref 38–126)
Anion gap: 7 (ref 5–15)
BUN: 14 mg/dL (ref 6–20)
CO2: 22 mmol/L (ref 22–32)
Calcium: 7.9 mg/dL — ABNORMAL LOW (ref 8.9–10.3)
Chloride: 115 mmol/L — ABNORMAL HIGH (ref 101–111)
Creatinine, Ser: 0.61 mg/dL (ref 0.44–1.00)
GFR calc Af Amer: >60 mL/min (ref >60)
GFR calc non Af Amer: >60 mL/min (ref >60)
Glucose, Bld: 97 mg/dL (ref 65–99)
Potassium: 3.4 mmol/L — ABNORMAL LOW (ref 3.5–5.1)
Sodium: 144 mmol/L (ref 135–145)
Total Bilirubin: 0.4 mg/dL (ref 0.3–1.2)
Total Protein: 7.5 g/dL (ref 6.5–8.1)

## 2015-01-27 LAB — RAPID URINE DRUG SCREEN, HOSP PERFORMED
Amphetamines: NOT DETECTED
Barbiturates: NOT DETECTED
Benzodiazepines: POSITIVE — AB
Cocaine: NOT DETECTED
Opiates: NOT DETECTED
Tetrahydrocannabinol: NOT DETECTED

## 2015-01-27 LAB — ACETAMINOPHEN LEVEL: Acetaminophen (Tylenol), Serum: <10 ug/mL — ABNORMAL LOW (ref 10–30)

## 2015-01-27 LAB — SALICYLATE LEVEL: Salicylate Lvl: <4 mg/dL (ref 2.8–30.0)

## 2015-01-27 LAB — ETHANOL: Alcohol, Ethyl (B): 417 mg/dL (ref <5)

## 2015-01-27 NOTE — ED Notes (Signed)
Pt breathing normally.  No signs of distress

## 2015-01-27 NOTE — ED Notes (Signed)
When patient is sober and/or ready to be discharged, her daughter Darrall Dears can be reached at [phone]

## 2015-01-27 NOTE — ED Notes (Signed)
Per daughter Shannon Cervantes [phone], pt's other daughter Shannon Cervantes is on the way to pick up the patient. Dr. Wilson Singer to rescind IVC papers

## 2015-01-27 NOTE — ED Notes (Signed)
Pt breathing normally. No signs of distress

## 2015-01-27 NOTE — ED Notes (Signed)
Pt's daughter Everitt Amber at bedside, daughter states she will not be taking patient home.

## 2015-01-27 NOTE — ED Notes (Signed)
Per Dr. Wilson Singer, patient can be discharged home if she has someone to transport her home safely.

## 2015-01-27 NOTE — Discharge Instructions (Signed)
Alcohol Use Disorder °Alcohol use disorder is a mental disorder. It is not a one-time incident of heavy drinking. Alcohol use disorder is the excessive and uncontrollable use of alcohol over time that leads to problems with functioning in one or more areas of daily living. People with this disorder risk harming themselves and others when they drink to excess. Alcohol use disorder also can cause other mental disorders, such as mood and anxiety disorders, and serious physical problems. People with alcohol use disorder often misuse other drugs.  °Alcohol use disorder is common and widespread. Some people with this disorder drink alcohol to cope with or escape from negative life events. Others drink to relieve chronic pain or symptoms of mental illness. People with a family history of alcohol use disorder are at higher risk of losing control and using alcohol to excess.  °SYMPTOMS  °Signs and symptoms of alcohol use disorder may include the following:  °· Consumption of alcohol in larger amounts or over a longer period of time than intended. °· Multiple unsuccessful attempts to cut down or control alcohol use.   °· A great deal of time spent obtaining alcohol, using alcohol, or recovering from the effects of alcohol (hangover). °· A strong desire or urge to use alcohol (cravings).   °· Continued use of alcohol despite problems at work, school, or home because of alcohol use.   °· Continued use of alcohol despite problems in relationships because of alcohol use. °· Continued use of alcohol in situations when it is physically hazardous, such as driving a car. °· Continued use of alcohol despite awareness of a physical or psychological problem that is likely related to alcohol use. Physical problems related to alcohol use can involve the brain, heart, liver, stomach, and intestines. Psychological problems related to alcohol use include intoxication, depression, anxiety, psychosis, delirium, and dementia.   °· The need for  increased amounts of alcohol to achieve the same desired effect, or a decreased effect from the consumption of the same amount of alcohol (tolerance). °· Withdrawal symptoms upon reducing or stopping alcohol use, or alcohol use to reduce or avoid withdrawal symptoms. Withdrawal symptoms include: °¨ Racing heart. °¨ Hand tremor. °¨ Difficulty sleeping. °¨ Nausea. °¨ Vomiting. °¨ Hallucinations. °¨ Restlessness. °¨ Seizures. °DIAGNOSIS °Alcohol use disorder is diagnosed through an assessment by your health care provider. Your health care provider may start by asking three or four questions to screen for excessive or problematic alcohol use. To confirm a diagnosis of alcohol use disorder, at least two symptoms must be present within a 12-month period. The severity of alcohol use disorder depends on the number of symptoms: °· Mild--two or three. °· Moderate--four or five. °· Severe--six or more. °Your health care provider may perform a physical exam or use results from lab tests to see if you have physical problems resulting from alcohol use. Your health care provider may refer you to a mental health professional for evaluation. °TREATMENT  °Some people with alcohol use disorder are able to reduce their alcohol use to low-risk levels. Some people with alcohol use disorder need to quit drinking alcohol. When necessary, mental health professionals with specialized training in substance use treatment can help. Your health care provider can help you decide how severe your alcohol use disorder is and what type of treatment you need. The following forms of treatment are available:  °· Detoxification. Detoxification involves the use of prescription medicines to prevent alcohol withdrawal symptoms in the first week after quitting. This is important for people with a history of symptoms   of withdrawal and for heavy drinkers who are likely to have withdrawal symptoms. Alcohol withdrawal can be dangerous and, in severe cases, cause  death. Detoxification is usually provided in a hospital or in-patient substance use treatment facility. °· Counseling or talk therapy. Talk therapy is provided by substance use treatment counselors. It addresses the reasons people use alcohol and ways to keep them from drinking again. The goals of talk therapy are to help people with alcohol use disorder find healthy activities and ways to cope with life stress, to identify and avoid triggers for alcohol use, and to handle cravings, which can cause relapse. °· Medicines. Different medicines can help treat alcohol use disorder through the following actions: °¨ Decrease alcohol cravings. °¨ Decrease the positive reward response felt from alcohol use. °¨ Produce an uncomfortable physical reaction when alcohol is used (aversion therapy). °· Support groups. Support groups are run by people who have quit drinking. They provide emotional support, advice, and guidance. °These forms of treatment are often combined. Some people with alcohol use disorder benefit from intensive combination treatment provided by specialized substance use treatment centers. Both inpatient and outpatient treatment programs are available. °Document Released: 07/17/2004 Document Revised: 10/24/2013 Document Reviewed: 09/16/2012 °ExitCare® Patient Information ©2015 [employer]. This information is not intended to replace advice given to you by your health care provider. Make sure you discuss any questions you have with your health care provider. ° ° ° ° °Emergency Department Resource Guide °1) Find a Doctor and Pay Out of Pocket °Although you won't have to find out who is covered by your insurance plan, it is a good idea to ask around and get recommendations. You will then need to call the office and see if the doctor you have chosen will accept you as a new patient and what types of options they offer for patients who are self-pay. Some doctors offer discounts or will set up payment plans for  their patients who do not have insurance, but you will need to ask so you aren't surprised when you get to your appointment. ° °2) Contact Your Local Health Department °Not all health departments have doctors that can see patients for sick visits, but many do, so it is worth a call to see if yours does. If you don't know where your local health department is, you can check in your phone book. The CDC also has a tool to help you locate your state's health department, and many state websites also have listings of all of their local health departments. ° °3) Find a Walk-in Clinic °If your illness is not likely to be very severe or complicated, you may want to try a walk in clinic. These are popping up all over the country in pharmacies, drugstores, and shopping centers. They're usually staffed by nurse practitioners or physician assistants that have been trained to treat common illnesses and complaints. They're usually fairly quick and inexpensive. However, if you have serious medical issues or chronic medical problems, these are probably not your best option. ° °No Primary Care Doctor: °- Call [facility] at  832-8000 - they can help you locate a primary care doctor that  accepts your insurance, provides certain services, etc. °- Physician Referral Service- 1-800-533-3463 ° °Chronic Pain Problems: °Organization         Address  Phone   Notes  °[facility]  (336) 297-2271 Patients need to be referred by their primary care doctor.  ° °Medication Assistance: °Organization           Address  Phone   Notes  °[place] [facility] [address] °[place], [place] [address] (336) 641-8030 --Must be a resident of [place] °-- Must have NO insurance coverage whatsoever (no Medicaid/ Medicare, etc.) °-- The pt. MUST have a primary care doctor that directs their care regularly and follows them in the community °  °[facility]  (866) 331-1348   °[employer]  (888)  892-1162   ° °Agencies that provide inexpensive medical care: °Organization         Address  Phone   Notes  °[facility]  (336) 832-8035   °[facility]    (336) 832-7272   °[facility] [address] °[place], [place] [address] (336) 832-4777   °[facility] [address], °[place] (336) 271-4999   °[facility]    (336) 373-0678   °[facility]    (336) 272-1050   °[facility] ° [address], [place] Phone:  (336) 832-4444, Fax:  (336) 832-4440 Hours of Operation:  9 am - 6 pm, M-F.  Also accepts Medicaid/Medicare and self-pay.  °[facility] ° [address], [place] Phone: (336) 832-3150, Fax: (336) 832-3151. Hours of Operation:  8:30 am - 5:30 pm, M-F.  Also accepts Medicaid and self-pay.  °[facility] [place] [address], [place] Phone: (336) 878-6027   °[facility] [address], [place], [place] (336)723-1848, Ext. 123 Mondays & Thursdays: 7-9 AM.  First 15 patients are seen on a first come, first serve basis. °  ° °Medicaid-accepting [place] Providers: ° °Organization         Address  Phone   Notes  °[facility] [address], [place] (336) 641-2100 Also accepts self-pay patients.  °[facility] [address], [place] ° (336) 856-9996   °[facility] [address], [place] (336) 288-8857   °[facility] [address] [place] [address], [place] (336) 299-7000   °Veita Bland [address], [place]  ° (336) 373-1557 Only accepts [place] Access Medicaid patients after they have their name applied to their card.  ° °Self-Pay (no insurance) in [place]: ° °Organization         Address  Phone   Notes  °Sickle Cell Patients, [facility] [address], [place] (336)  832-1970   °[facility] [address], [place] (336) 832-4400   °[facility] [facility] ° [address], [place] (336) 992-4800   °[facility]/Dr. Osei-Bonsu ° [address] [place] [address], [place] or [address], [place] (336) 841-8500 Phone number for both [place] and [place] locations is the same.  °[facility] [address], [place] (336) 299-0000   °[facility] [address] [place] [address], [place] or [address] (336) 852-7530 °(336) 878-2260   °[facility] [address], [place] (336) 350-1642, phone; (336) 294-5005, fax Sees patients 1st and 3rd Saturday of every month.  Must not qualify for public or private insurance (i.e. Medicaid, Medicare, [place] Health Choice, Veterans' Benefits) • Household income should be no more than 200% of the poverty level •The clinic cannot treat you if you are pregnant or think you   are pregnant • Sexually transmitted diseases are not treated at the clinic.  ° ° °Dental Care: °Organization         Address  Phone  Notes  °[facility] [address], [place] (336) 641-6152 Accepts children up to age 21 who are enrolled in Medicaid or [place] Health Choice; pregnant women with a Medicaid card; and children who have applied for Medicaid or [place] Health Choice, but were declined, whose parents can pay a reduced fee at time of service.  °[facility] [place]  [address], [place] (336) 641-7733 Accepts children up to age 21 who are enrolled in Medicaid or [place] Health Choice; pregnant women with a Medicaid card; and children who have applied for Medicaid or [place] Health Choice, but were declined, whose parents can pay a reduced fee at time of service.  °[facility] ° [address], [place] (336) 641-4533 Patients are  seen by appointment only. Walk-ins are not accepted. [facility] will see patients 18 years of age and older. °Monday - Tuesday (8am-5pm) °Most Wednesdays (8:30-5pm) °$30 per visit, cash only  °[facility] ° [address], [place] (336) 641-4533 Patients are seen by appointment only. Walk-ins are not accepted. [facility] will see patients 18 years of age and older. °One Wednesday Evening (Monthly: Volunteer Based).  $30 per visit, cash only  °[facility]  (919) 537-3737 for adults; Children under age 4, call Graduate Pediatric Dentistry at (919) [phone]. Children aged 4-14, please call (919) [phone] to request a pediatric application. ° Dental services are provided in all areas of dental care including fillings, crowns and bridges, complete and partial dentures, implants, gum treatment, root canals, and extractions. Preventive care is also provided. Treatment is provided to both adults and children. °Patients are selected via a lottery and there is often a waiting list. °  °[facility] [address], °[place] ° (336) 763-8833 [email or web address] °  °[facility] [address], [place], [place] (336)723-1848, Ext. 123 Second and Fourth Thursday of each month, opens at 6:30 AM; Clinic ends at 9 AM.  Patients are seen on a first-come first-served basis, and a limited number are seen during each clinic.  ° °[facility] ° [address], [place], [place] (336) 723-7904   Eligibility Requirements °You must have lived in [place], [place], or [place] counties for at least the last three months. °  You cannot be eligible for state or federal sponsored healthcare insurance, including Veterans Administration, Medicaid, or Medicare. °  You generally cannot be eligible for healthcare insurance through your employer.  °  How to apply: °Eligibility screenings are held every Tuesday and Wednesday afternoon from 1:00 pm until 4:00  pm. You do not need an appointment for the interview!  °[facility] [address], [place], [place] 336-631-2330   °[facility]  336-342-8273   °[facility]  336-703-3100   °[facility]  336-570-6415   ° °Behavioral Health Resources in the Community: °Intensive Outpatient Programs °Organization         Address  Phone  Notes  °[facility] [address], [place], [place] 336-878-6098   °[facility] [address], [place],  336-832-9800   °[facility] [address], [place], [place] ° 336-882-2125   °[facility] [address],  °[place], [place] 1-800-853-5163 or 336-641-4981   °Substance Abuse Resources °Organization         Address  Phone  Notes  °[facility]  336-882-2125   °[facility]  336-784-9470   °[facility]  336-285-9073   °[facility]  336-845-3988   °Residential & Outpatient Substance Abuse Program  1-800-659-3381   °Psychological Services °Organization         Address  Phone  Notes  °[facility]  336- 832-9600   °[facility]  336- 378-7881   °[facility] [address], [place] 1-800-853-5163 or 336-641-4981   ° °Mobile Crisis Teams °Organization         Address  Phone  Notes  °[facility], Mobile Crisis Care Unit  1-877-626-1772   °Assertive °[facility] ° [address] [place], [place] 336-834-9664   °Sharon DeEsch [address] °[place] 336-554-5454   ° °Self-Help/Support Groups °Organization         Address  Phone             Notes  °[facility] [place] - variety of support groups  336- 373-1402 Call for more information  °Narcotics Anonymous (NA), [facility] [address], °[place]  2 meetings at this location  ° °Residential Treatment Programs °Organization          Address  Phone  Notes  °[facility] [address],    °[place]  1-866-801-8205   °[facility] ° [address], [place], [place] 704-293-8524   °[facility] [address], [place] 336-845-3988 Admissions: 8am-3pm M-F  °[facility] [address]    °[place], [place] 336-841-1104   °[facility] [address], [place], [place] 336-379-7146   °[facility] [address]  °[place], [place] 336-285-9073   °[facility] - Intensive Outpatient [address], [place], [place] 336-852-3033   °[facility] [address]  °[place], [place] 1-877-615-2722 or 336-784-9470   °[facility] [address] , [place] 336-227-7417 Accepts Medicaid  °[facility] [address]  ° [place] 1-800-659-3381 Substance Abuse/Addiction Treatment  ° °[place] Behavioral Health Resources °Organization         Address  Phone  Notes  °[facility]  (888) 581-9988   °Julie Brannon, PhD [address] [place], [place]   (336) 349-5553 or (336) 951-0000   °[facility]   [address] °[place], [place] (336) 349-4454   °[facility] [address], [place], [place] (336) 342-8316 Insurance/Medicaid/sponsorship through [facility]  °[facility] [address]                                    [place], [place] (336) 342-8316 Therapy/tele-psych/case  °[facility] [address]  ° [place], [place] (336) 349-2233    °Dr. Arfeen  (336) 349-4544   °[facility] [place]  [employer] [place] [facility] 1) [address], [place] °2) [address], [place] °3)  [address], [place] (336) 349-3220 °(336) 342-7768 ° °(336) 342-8140   °[place] [facility] (336)   342-1394 or (336) 342-3537 (After Hours)    ° ° ° °

## 2015-02-01 ENCOUNTER — Telehealth: Payer: Self-pay | Admitting: Family

## 2015-02-01 ENCOUNTER — Ambulatory Visit: Payer: 59 | Admitting: Family

## 2015-02-01 NOTE — Telephone Encounter (Signed)
Noted - do not count as No Show.

## 2015-02-01 NOTE — Telephone Encounter (Signed)
FYI Patient canceled appointment today b/c brother is in hospital and she is with him.

## 2015-02-02 NOTE — Telephone Encounter (Signed)
done

## 2015-06-22 ENCOUNTER — Emergency Department (HOSPITAL_COMMUNITY)
Admission: EM | Admit: 2015-06-22 | Discharge: 2015-06-23 | Disposition: A | Discharge status: Home / Self Care | Payer: 59 | Attending: Emergency Medicine | Admitting: Emergency Medicine

## 2015-06-22 ENCOUNTER — Encounter (HOSPITAL_COMMUNITY): Payer: Self-pay | Admitting: Emergency Medicine

## 2015-06-22 ENCOUNTER — Emergency Department (HOSPITAL_COMMUNITY): Payer: 59

## 2015-06-22 DIAGNOSIS — Z8781 Personal history of (healed) traumatic fracture: Secondary | ICD-10-CM | POA: Insufficient documentation

## 2015-06-22 DIAGNOSIS — F1310 Sedative, hypnotic or anxiolytic abuse, uncomplicated: Secondary | ICD-10-CM | POA: Insufficient documentation

## 2015-06-22 DIAGNOSIS — Z8679 Personal history of other diseases of the circulatory system: Secondary | ICD-10-CM | POA: Insufficient documentation

## 2015-06-22 DIAGNOSIS — F329 Major depressive disorder, single episode, unspecified: Secondary | ICD-10-CM | POA: Insufficient documentation

## 2015-06-22 DIAGNOSIS — Z793 Long term (current) use of hormonal contraceptives: Secondary | ICD-10-CM | POA: Insufficient documentation

## 2015-06-22 DIAGNOSIS — Z79899 Other long term (current) drug therapy: Secondary | ICD-10-CM | POA: Insufficient documentation

## 2015-06-22 DIAGNOSIS — F10120 Alcohol abuse with intoxication, uncomplicated: Secondary | ICD-10-CM | POA: Insufficient documentation

## 2015-06-22 DIAGNOSIS — Z87891 Personal history of nicotine dependence: Secondary | ICD-10-CM | POA: Insufficient documentation

## 2015-06-22 DIAGNOSIS — Z3202 Encounter for pregnancy test, result negative: Secondary | ICD-10-CM | POA: Insufficient documentation

## 2015-06-22 DIAGNOSIS — Z8639 Personal history of other endocrine, nutritional and metabolic disease: Secondary | ICD-10-CM | POA: Insufficient documentation

## 2015-06-22 DIAGNOSIS — F10920 Alcohol use, unspecified with intoxication, uncomplicated: Secondary | ICD-10-CM

## 2015-06-22 LAB — CBC WITH DIFFERENTIAL/PLATELET
Basophils Absolute: 0 10*3/uL (ref 0.0–0.1)
Basophils Relative: 1 %
EOS ABS: 0.1 10*3/uL (ref 0.0–0.7)
Eosinophils Relative: 2 %
HCT: 35 % — ABNORMAL LOW (ref 36.0–46.0)
HEMOGLOBIN: 11.3 g/dL — AB (ref 12.0–15.0)
Lymphocytes Relative: 31 %
Lymphs Abs: 2 10*3/uL (ref 0.7–4.0)
MCH: 28.6 pg (ref 26.0–34.0)
MCHC: 32.3 g/dL (ref 30.0–36.0)
MCV: 88.6 fL (ref 78.0–100.0)
MONO ABS: 0.6 10*3/uL (ref 0.1–1.0)
MONOS PCT: 9 %
NEUTROS ABS: 3.7 10*3/uL (ref 1.7–7.7)
Neutrophils Relative %: 57 %
PLATELETS: 352 10*3/uL (ref 150–400)
RBC: 3.95 MIL/uL (ref 3.87–5.11)
RDW: 16.5 % — ABNORMAL HIGH (ref 11.5–15.5)
WBC: 6.4 10*3/uL (ref 4.0–10.5)

## 2015-06-22 LAB — I-STAT BETA HCG BLOOD, ED (MC, WL, AP ONLY): I-stat hCG, quantitative: 6.5 m[IU]/mL — ABNORMAL HIGH (ref <5)
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
## 2015-06-23 ENCOUNTER — Encounter (HOSPITAL_COMMUNITY): Payer: Self-pay | Admitting: Emergency Medicine

## 2015-06-23 LAB — RAPID URINE DRUG SCREEN, HOSP PERFORMED
Amphetamines: NOT DETECTED
Barbiturates: NOT DETECTED
Benzodiazepines: POSITIVE — AB
Cocaine: NOT DETECTED
OPIATES: NOT DETECTED
Tetrahydrocannabinol: NOT DETECTED

## 2015-06-23 LAB — SALICYLATE LEVEL: Salicylate Lvl: <4 mg/dL (ref 2.8–30.0)

## 2015-06-23 LAB — ETHANOL: ALCOHOL ETHYL (B): 308 mg/dL — AB (ref <5)

## 2015-06-23 LAB — HCG, QUANTITATIVE, PREGNANCY: hCG, Beta Chain, Quant, S: <1 m[IU]/mL (ref <5)

## 2015-06-23 LAB — ACETAMINOPHEN LEVEL

## 2015-06-23 NOTE — ED Notes (Signed)
[address], Shannon Cervantes and Dr. Randal Buba notified

## 2015-06-23 NOTE — ED Notes (Signed)
Pt's contact :  Rosina Lowenstein---- tel# [phone]

## 2015-06-23 NOTE — ED Notes (Signed)
Pt ambulated well to the bathroom without assistance----- gait and balance steady.

## 2015-06-23 NOTE — Discharge Instructions (Signed)
Alcohol Intoxication  Alcohol intoxication occurs when you drink enough alcohol that it affects your ability to function. It can be mild or very severe. Drinking a lot of alcohol in a short time is called binge drinking. This can be very harmful. Drinking alcohol can also be more dangerous if you are taking medicines or other drugs. Some of the effects caused by alcohol may include:  · Loss of coordination.  · Changes in mood and behavior.  · Unclear thinking.  · Trouble talking (slurred speech).  · Throwing up (vomiting).  · Confusion.  · Slowed breathing.  · Twitching and shaking (seizures).  · Loss of consciousness.  HOME CARE  · Do not drive after drinking alcohol.  · Drink enough water and fluids to keep your pee (urine) clear or pale yellow. Avoid caffeine.  · Only take medicine as told by your doctor.  GET HELP IF:  · You throw up (vomit) many times.  · You do not feel better after a few days.  · You frequently have alcohol intoxication. Your doctor can help decide if you should see a substance use treatment counselor.  GET HELP RIGHT AWAY IF:  · You become shaky when you stop drinking.  · You have twitching and shaking.  · You throw up blood. It may look bright red or like coffee grounds.  · You notice blood in your poop (bowel movements).  · You become lightheaded or pass out (faint).  MAKE SURE YOU:   · Understand these instructions.  · Will watch your condition.  · Will get help right away if you are not doing well or get worse.     This information is not intended to replace advice given to you by your health care provider. Make sure you discuss any questions you have with your health care provider.     Document Released: 11/26/2007 Document Revised: 02/09/2013 Document Reviewed: 11/12/2012  Elsevier Interactive Patient Education ©2016 [employer]

## 2015-06-23 NOTE — ED Notes (Signed)
Pt's contact was called and notified of pt's discharge---- stated she will be on her way to pick up pt.

## 2015-06-24 ENCOUNTER — Emergency Department (HOSPITAL_COMMUNITY)
Admission: EM | Admit: 2015-06-24 | Discharge: 2015-06-24 | Disposition: A | Discharge status: Home / Self Care | Payer: 59 | Attending: Emergency Medicine | Admitting: Emergency Medicine

## 2015-06-24 ENCOUNTER — Encounter (HOSPITAL_COMMUNITY): Payer: Self-pay

## 2015-06-24 DIAGNOSIS — R251 Tremor, unspecified: Secondary | ICD-10-CM | POA: Insufficient documentation

## 2015-06-24 DIAGNOSIS — F1010 Alcohol abuse, uncomplicated: Secondary | ICD-10-CM | POA: Insufficient documentation

## 2015-06-24 DIAGNOSIS — Z8679 Personal history of other diseases of the circulatory system: Secondary | ICD-10-CM | POA: Insufficient documentation

## 2015-06-24 DIAGNOSIS — Z8781 Personal history of (healed) traumatic fracture: Secondary | ICD-10-CM | POA: Insufficient documentation

## 2015-06-24 DIAGNOSIS — Z8639 Personal history of other endocrine, nutritional and metabolic disease: Secondary | ICD-10-CM | POA: Insufficient documentation

## 2015-06-24 DIAGNOSIS — Z79899 Other long term (current) drug therapy: Secondary | ICD-10-CM | POA: Insufficient documentation

## 2015-06-24 DIAGNOSIS — F1310 Sedative, hypnotic or anxiolytic abuse, uncomplicated: Secondary | ICD-10-CM | POA: Insufficient documentation

## 2015-06-24 DIAGNOSIS — R5383 Other fatigue: Secondary | ICD-10-CM | POA: Insufficient documentation

## 2015-06-24 DIAGNOSIS — F419 Anxiety disorder, unspecified: Secondary | ICD-10-CM | POA: Insufficient documentation

## 2015-06-24 DIAGNOSIS — Z87891 Personal history of nicotine dependence: Secondary | ICD-10-CM | POA: Insufficient documentation

## 2015-06-24 LAB — RAPID URINE DRUG SCREEN, HOSP PERFORMED
AMPHETAMINES: NOT DETECTED
Barbiturates: NOT DETECTED
Benzodiazepines: POSITIVE — AB
Cocaine: NOT DETECTED
OPIATES: NOT DETECTED
Tetrahydrocannabinol: NOT DETECTED

## 2015-06-24 LAB — CBC
HCT: 35.4 % — ABNORMAL LOW (ref 36.0–46.0)
HEMOGLOBIN: 11.6 g/dL — AB (ref 12.0–15.0)
MCH: 28.4 pg (ref 26.0–34.0)
MCHC: 32.8 g/dL (ref 30.0–36.0)
MCV: 86.6 fL (ref 78.0–100.0)
Platelets: 377 10*3/uL (ref 150–400)
RBC: 4.09 MIL/uL (ref 3.87–5.11)
RDW: 15.8 % — ABNORMAL HIGH (ref 11.5–15.5)
WBC: 7.4 10*3/uL (ref 4.0–10.5)

## 2015-06-24 LAB — COMPREHENSIVE METABOLIC PANEL
ALK PHOS: 121 U/L (ref 38–126)
ALT: 29 U/L (ref 14–54)
ANION GAP: 10 (ref 5–15)
AST: 39 U/L (ref 15–41)
Albumin: 4.4 g/dL (ref 3.5–5.0)
BILIRUBIN TOTAL: 1 mg/dL (ref 0.3–1.2)
BUN: 9 mg/dL (ref 6–20)
CALCIUM: 9.1 mg/dL (ref 8.9–10.3)
CO2: 25 mmol/L (ref 22–32)
Chloride: 102 mmol/L (ref 101–111)
Creatinine, Ser: 0.52 mg/dL (ref 0.44–1.00)
Glucose, Bld: 106 mg/dL — ABNORMAL HIGH (ref 65–99)
POTASSIUM: 4 mmol/L (ref 3.5–5.1)
Sodium: 137 mmol/L (ref 135–145)
Total Protein: 8.1 g/dL (ref 6.5–8.1)

## 2015-06-24 LAB — ETHANOL

## 2015-06-24 MED ORDER — ACETAMINOPHEN 500 MG PO TABS
1000.0000 mg | ORAL_TABLET | Freq: Once | ORAL | Status: AC
  Administered 2015-06-24: 1000 mg via ORAL
  Filled 2015-06-24: qty 2

## 2015-06-24 MED ORDER — CHLORDIAZEPOXIDE HCL 25 MG PO CAPS: ORAL_CAPSULE | ORAL | Status: DC

## 2015-06-24 MED ORDER — CHLORDIAZEPOXIDE HCL 25 MG PO CAPS
50.0000 mg | ORAL_CAPSULE | Freq: Once | ORAL | Status: AC
  Administered 2015-06-24: 50 mg via ORAL
  Filled 2015-06-24: qty 2

## 2015-06-24 NOTE — ED Provider Notes (Signed)
CSN: [id number]     Arrival date & time 06/24/15  0441 History   First MD Initiated Contact with Patient 06/24/15 [phone]     Chief Complaint  Patient presents with  . Drug / Alcohol Assessment     (Consider location/radiation/quality/duration/timing/severity/associated sxs/prior Treatment) HPI Comments: Patient presents requesting alcohol detox. She states she has a primary history of alcohol abuse. She was in a rehabilitation facility in [place] and was sober until about a week ago. She started drinking again last Tuesday. She states that she had not had any alcohol in the last 24 hours. She feels a little bit shaky. She contacted [facility] which told her that she needed to be medically cleared and advised her to go to the emergency department. She has a history of depression but denies any suicidal ideations. Other than feeling shaky she denies any other physical complaints or recent illnesses.  Patient is a 45 y.o. female presenting with drug/alcohol assessment.  Drug / Alcohol Assessment Associated symptoms: no abdominal pain, no headaches, no nausea, no shortness of breath, no suicidal ideation, no vomiting and no weakness     Past Medical History  Diagnosis Date  . Foot fracture 01-07-2011  . Hypertensive cardiomyopathy ([place])   . Depression   . Hyperlipidemia   . Anxiety   . Alcohol abuse    Past Surgical History  Procedure Laterality Date  . Cesarean section    . Gastric bypass     Family History  Problem Relation Age of Onset  . Hypertension Mother   . Heart disease Mother   . Hyperlipidemia Mother   . Heart disease Father   . Alcohol abuse Brother   . Alcohol abuse Paternal Uncle    Social History  Substance Use Topics  . Smoking status: Former Smoker -- 0.01 packs/day    Types: Cigarettes  . Smokeless tobacco: Never Used     Comment: 1 Cigarettes every other Day  . Alcohol Use: Yes     Comment: daily use-1 1/2 pints cognac   OB History    No data  available     Review of Systems  Constitutional: Positive for fatigue. Negative for fever, chills and diaphoresis.  HENT: Negative for congestion, rhinorrhea and sneezing.   Eyes: Negative.   Respiratory: Negative for cough, chest tightness and shortness of breath.   Cardiovascular: Negative for chest pain and leg swelling.  Gastrointestinal: Negative for nausea, vomiting, abdominal pain, diarrhea and blood in stool.  Genitourinary: Negative for frequency, hematuria, flank pain and difficulty urinating.  Musculoskeletal: Negative for back pain and arthralgias.  Skin: Negative for rash.  Neurological: Negative for dizziness, speech difficulty, weakness, numbness and headaches.  Psychiatric/Behavioral: Negative for suicidal ideas. The patient is nervous/anxious.       Allergies  Suboxone  Home Medications   Prior to Admission medications   Medication Sig Start Date End Date Taking? Authorizing Provider  acetaminophen (TYLENOL) 500 MG tablet Take 1,000 mg by mouth every 6 (six) hours as needed for moderate pain.   Yes Historical Provider, MD  buPROPion (WELLBUTRIN SR) 150 MG 12 hr tablet Take 150 mg by mouth 2 (two) times daily.   Yes Historical Provider, MD  busPIRone (BUSPAR) 10 MG tablet Take 10 mg by mouth 3 (three) times daily.   Yes Historical Provider, MD  folic acid (FOLVITE) 1 MG tablet Take 1 mg by mouth daily.   Yes Historical Provider, MD  ibuprofen (ADVIL,MOTRIN) 200 MG tablet Take 800 mg by mouth every  6 (six) hours as needed for headache or moderate pain.   Yes Historical Provider, MD  lisinopril (PRINIVIL,ZESTRIL) 5 MG tablet Take 1 tablet (5 mg total) by mouth daily. 12/11/14  Yes Golden Circle, FNP  pantoprazole (PROTONIX) 40 MG tablet Take 1 tablet (40 mg total) by mouth daily. 11/17/14  Yes Kerrie Buffalo, NP  sucralfate (CARAFATE) 1 g tablet Take 1 g by mouth 2 (two) times daily.   Yes Historical Provider, MD  traZODone (DESYREL) 50 MG tablet Take 25 mg by mouth  at bedtime.   Yes Historical Provider, MD  buPROPion (WELLBUTRIN XL) 300 MG 24 hr tablet Take 1 tablet (300 mg total) by mouth daily. Patient not taking: Reported on 06/23/2015 12/11/14   Golden Circle, FNP  chlordiazePOXIDE (LIBRIUM) 25 MG capsule 50mg  PO TID x 1D, then 25-50mg  PO BID X 1D, then 25-50mg  PO QD X 1D 06/24/15   Malvin Johns, MD  ondansetron (ZOFRAN ODT) 4 MG disintegrating tablet Take 1 tablet (4 mg total) by mouth every 8 (eight) hours as needed for nausea or vomiting. Patient not taking: Reported on 06/23/2015 01/11/15   Golden Circle, FNP  potassium chloride SA (K-DUR,KLOR-CON) 20 MEQ tablet Take 1 tablet (20 mEq total) by mouth daily. Patient not taking: Reported on 06/23/2015 01/16/15   Elnora Morrison, MD  promethazine (PHENERGAN) 25 MG tablet Take 1 tablet (25 mg total) by mouth every 6 (six) hours as needed for nausea or vomiting. Patient not taking: Reported on 06/23/2015 01/17/15   Kaitlyn Szekalski, PA-C   BP 137/94 mmHg  Pulse 103  Temp(Src) 97.8 F (36.6 C) (Oral)  Resp 18  SpO2 99% Physical Exam  Constitutional: She is oriented to person, place, and time. She appears well-developed and well-nourished.  HENT:  Head: Normocephalic and atraumatic.  Eyes: Pupils are equal, round, and reactive to light.  Neck: Normal range of motion. Neck supple.  Cardiovascular: Normal rate, regular rhythm and normal heart sounds.   Pulmonary/Chest: Effort normal and breath sounds normal. No respiratory distress. She has no wheezes. She has no rales. She exhibits no tenderness.  Abdominal: Soft. Bowel sounds are normal. There is no tenderness. There is no rebound and no guarding.  Musculoskeletal: Normal range of motion. She exhibits no edema.  Mild tremor noted  Lymphadenopathy:    She has no cervical adenopathy.  Neurological: She is alert and oriented to person, place, and time.  Skin: Skin is warm and dry. No rash noted.  Psychiatric: She has a normal mood and affect.     ED Course  Procedures (including critical care time) Labs Review Results for orders placed or performed during the hospital encounter of 06/24/15  Comprehensive metabolic panel  Result Value Ref Range   Sodium 137 135 - 145 mmol/L   Potassium 4.0 3.5 - 5.1 mmol/L   Chloride 102 101 - 111 mmol/L   CO2 25 22 - 32 mmol/L   Glucose, Bld 106 (H) 65 - 99 mg/dL   BUN 9 6 - 20 mg/dL   Creatinine, Ser 0.52 0.44 - 1.00 mg/dL   Calcium 9.1 8.9 - 10.3 mg/dL   Total Protein 8.1 6.5 - 8.1 g/dL   Albumin 4.4 3.5 - 5.0 g/dL   AST 39 15 - 41 U/L   ALT 29 14 - 54 U/L   Alkaline Phosphatase 121 38 - 126 U/L   Total Bilirubin 1.0 0.3 - 1.2 mg/dL   GFR calc non Af Amer >60 >60 mL/min   GFR  calc Af Amer >60 >60 mL/min   Anion gap 10 5 - 15  Ethanol (ETOH)  Result Value Ref Range   Alcohol, Ethyl (B) <5 <5 mg/dL  CBC  Result Value Ref Range   WBC 7.4 4.0 - 10.5 K/uL   RBC 4.09 3.87 - 5.11 MIL/uL   Hemoglobin 11.6 (L) 12.0 - 15.0 g/dL   HCT 35.4 (L) 36.0 - 46.0 %   MCV 86.6 78.0 - 100.0 fL   MCH 28.4 26.0 - 34.0 pg   MCHC 32.8 30.0 - 36.0 g/dL   RDW 15.8 (H) 11.5 - 15.5 %   Platelets 377 150 - 400 K/uL   Ct Head Wo Contrast  06/22/2015  CLINICAL DATA:  45 year old female with the altered mental status. Agitation. EXAM: CT HEAD WITHOUT CONTRAST TECHNIQUE: Contiguous axial images were obtained from the base of the skull through the vertex without intravenous contrast. COMPARISON:  Head CT 09/26/2008. FINDINGS: No acute intracranial abnormalities. Specifically, no evidence of acute intracranial hemorrhage, no definite findings of acute/subacute cerebral ischemia, no mass, mass effect, hydrocephalus or abnormal intra or extra-axial fluid collections. Visualized paranasal sinuses and mastoids are well pneumatized. No acute displaced skull fractures are identified. IMPRESSION: *No acute intracranial abnormalities. *The appearance of the brain is normal. Electronically Signed   By: Vinnie Langton  M.D.   On: 06/22/2015 23:52      Imaging Review Ct Head Wo Contrast  06/22/2015  CLINICAL DATA:  45 year old female with the altered mental status. Agitation. EXAM: CT HEAD WITHOUT CONTRAST TECHNIQUE: Contiguous axial images were obtained from the base of the skull through the vertex without intravenous contrast. COMPARISON:  Head CT 09/26/2008. FINDINGS: No acute intracranial abnormalities. Specifically, no evidence of acute intracranial hemorrhage, no definite findings of acute/subacute cerebral ischemia, no mass, mass effect, hydrocephalus or abnormal intra or extra-axial fluid collections. Visualized paranasal sinuses and mastoids are well pneumatized. No acute displaced skull fractures are identified. IMPRESSION: *No acute intracranial abnormalities. *The appearance of the brain is normal. Electronically Signed   By: Vinnie Langton M.D.   On: 06/22/2015 23:52   I have personally reviewed and evaluated these images and lab results as part of my medical decision-making.   EKG Interpretation None      MDM   Final diagnoses:  Alcohol abuse    Patient presents with alcohol abuse. She has a CIWA score of 5.  She has no history of alcohol related seizures. She was discharged home and given resources for outpatient follow-up. She was given prescription for Librium.    Malvin Johns, MD 06/24/15 [phone]

## 2015-06-24 NOTE — Discharge Instructions (Signed)
Alcohol Use Disorder °Alcohol use disorder is a mental disorder. It is not a one-time incident of heavy drinking. Alcohol use disorder is the excessive and uncontrollable use of alcohol over time that leads to problems with functioning in one or more areas of daily living. People with this disorder risk harming themselves and others when they drink to excess. Alcohol use disorder also can cause other mental disorders, such as mood and anxiety disorders, and serious physical problems. People with alcohol use disorder often misuse other drugs.  °Alcohol use disorder is common and widespread. Some people with this disorder drink alcohol to cope with or escape from negative life events. Others drink to relieve chronic pain or symptoms of mental illness. People with a family history of alcohol use disorder are at higher risk of losing control and using alcohol to excess.  °Drinking too much alcohol can cause injury, accidents, and health problems. One drink can be too much when you are: °· Working. °· Pregnant or breastfeeding. °· Taking medicines. Ask your doctor. °· Driving or planning to drive. °SYMPTOMS  °Signs and symptoms of alcohol use disorder may include the following:  °· Consumption of alcohol in larger amounts or over a longer period of time than intended. °· Multiple unsuccessful attempts to cut down or control alcohol use.   °· A great deal of time spent obtaining alcohol, using alcohol, or recovering from the effects of alcohol (hangover). °· A strong desire or urge to use alcohol (cravings).   °· Continued use of alcohol despite problems at work, school, or home because of alcohol use.   °· Continued use of alcohol despite problems in relationships because of alcohol use. °· Continued use of alcohol in situations when it is physically hazardous, such as driving a car. °· Continued use of alcohol despite awareness of a physical or psychological problem that is likely related to alcohol use. Physical  problems related to alcohol use can involve the brain, heart, liver, stomach, and intestines. Psychological problems related to alcohol use include intoxication, depression, anxiety, psychosis, delirium, and dementia.   °· The need for increased amounts of alcohol to achieve the same desired effect, or a decreased effect from the consumption of the same amount of alcohol (tolerance). °· Withdrawal symptoms upon reducing or stopping alcohol use, or alcohol use to reduce or avoid withdrawal symptoms. Withdrawal symptoms include: °¨ Racing heart. °¨ Hand tremor. °¨ Difficulty sleeping. °¨ Nausea. °¨ Vomiting. °¨ Hallucinations. °¨ Restlessness. °¨ Seizures. °DIAGNOSIS °Alcohol use disorder is diagnosed through an assessment by your health care provider. Your health care provider may start by asking three or four questions to screen for excessive or problematic alcohol use. To confirm a diagnosis of alcohol use disorder, at least two symptoms must be present within a 12-month period. The severity of alcohol use disorder depends on the number of symptoms: °· Mild--two or three. °· Moderate--four or five. °· Severe--six or more. °Your health care provider may perform a physical exam or use results from lab tests to see if you have physical problems resulting from alcohol use. Your health care provider may refer you to a mental health professional for evaluation. °TREATMENT  °Some people with alcohol use disorder are able to reduce their alcohol use to low-risk levels. Some people with alcohol use disorder need to quit drinking alcohol. When necessary, mental health professionals with specialized training in substance use treatment can help. Your health care provider can help you decide how severe your alcohol use disorder is and what type of treatment you need.   The following forms of treatment are available:   Detoxification. Detoxification involves the use of prescription medicines to prevent alcohol withdrawal  symptoms in the first week after quitting. This is important for people with a history of symptoms of withdrawal and for heavy drinkers who are likely to have withdrawal symptoms. Alcohol withdrawal can be dangerous and, in severe cases, cause death. Detoxification is usually provided in a hospital or in-patient substance use treatment facility.  Counseling or talk therapy. Talk therapy is provided by substance use treatment counselors. It addresses the reasons people use alcohol and ways to keep them from drinking again. The goals of talk therapy are to help people with alcohol use disorder find healthy activities and ways to cope with life stress, to identify and avoid triggers for alcohol use, and to handle cravings, which can cause relapse.  Medicines.Different medicines can help treat alcohol use disorder through the following actions:  Decrease alcohol cravings.  Decrease the positive reward response felt from alcohol use.  Produce an uncomfortable physical reaction when alcohol is used (aversion therapy).  Support groups. Support groups are run by people who have quit drinking. They provide emotional support, advice, and guidance. These forms of treatment are often combined. Some people with alcohol use disorder benefit from intensive combination treatment provided by specialized substance use treatment centers. Both inpatient and outpatient treatment programs are available.   This information is not intended to replace advice given to you by your health care provider. Make sure you discuss any questions you have with your health care provider.   Document Released: 07/17/2004 Document Revised: 06/30/2014 Document Reviewed: 09/16/2012 Elsevier Interactive Patient Education 2016 [employer]. Emergency Department Resource Guide 1) Find a Doctor and Pay Out of Pocket Although you won't have to find out who is covered by your insurance plan, it is a good idea to ask around and get  recommendations. You will then need to call the office and see if the doctor you have chosen will accept you as a new patient and what types of options they offer for patients who are self-pay. Some doctors offer discounts or will set up payment plans for their patients who do not have insurance, but you will need to ask so you aren't surprised when you get to your appointment.  2) Contact Your Local Health Department Not all health departments have doctors that can see patients for sick visits, but many do, so it is worth a call to see if yours does. If you don't know where your local health department is, you can check in your phone book. The CDC also has a tool to help you locate your state's health department, and many state websites also have listings of all of their local health departments.  3) Find a [facility] If your illness is not likely to be very severe or complicated, you may want to try a walk in clinic. These are popping up all over the country in pharmacies, drugstores, and shopping centers. They're usually staffed by nurse practitioners or physician assistants that have been trained to treat common illnesses and complaints. They're usually fairly quick and inexpensive. However, if you have serious medical issues or chronic medical problems, these are probably not your best option.   Chronic Pain Problems: Organization         Address     Phone             Notes  [facility]  [phone] Patients need  to be referred by their primary care doctor.   Medication Assistance: Organization         Address     Phone             Notes  [facility] Medication [facility] [place], [place] [address] [phone] --Must be a resident of [facility] -- Must have NO insurance coverage whatsoever (no Medicaid/ Medicare, etc.) -- The pt. MUST have a primary care doctor that directs their care regularly and follows them in the  community   MedAssist  [phone]   [employer]  [phone]    Agencies that provide inexpensive medical care: Organization         Address     Phone             Notes  [place]  [phone]   [facility]    [phone]   [facility] [place], [place] [address] [phone]   [place] [address] [place] [phone]   Planned Parenthood    [phone]   [facility]    [phone]   [place] and [place] [address], [place] Phone:  [phone], Fax:  [phone] Hours of Operation:  9 am - 6 pm, M-F.  Also accepts Medicaid/Medicare and self-pay.  [facility] for [place], [address], [place] Phone: [phone], Fax: [phone]. Hours of Operation:  8:30 am - 5:30 pm, M-F.  Also accepts Medicaid and self-pay.  [facility] [place] [address] [place] Phone: [phone]   [place], [place], [place] [phone], Ext. [phone] Mondays & Thursdays: 7-9 AM.  First 15 patients are seen on a first come, first serve basis.   [facility] [place] [address],  [address] [phone] Accepts Medicaid   [place] Providers:  Organization         Address     Phone             Notes  [facility] [address], [place] [phone] Also accepts self-pay patients.  [facility] [id number] [place], [place]  [phone]   [place], [address], [place] [phone]   [facility] [address], [place] [phone]   [place]ianne Lei [address], [place]   [phone] Only accepts [place] Access [place] patients after they have their name applied to  their card.   Self-Pay (no insurance) in [facility]:  Organization         Address     Phone             Notes  Sickle Cell Patients, [facility] [place] [phone]   [facility] [place] [phone]   [facility] [place]  [place], [place], [place] [phone]   [facility]/Dr. Osei-Bonsu  [address], [place] or [address], [place] [phone] Phone number for both [place] and [place] locations is the same.  Urgent Medical and  [facility] [address] [place] [phone]   [facility] [address] [place] or [address] [phone]   [facility] [address], [place] [phone], phone; [phone], fax Sees patients 1st and 3rd Saturday of every month.  Must not qualify for public or private insurance (i.e. Medicaid, Medicare, [place] Health Choice, Veterans' Benefits)  Household income should be no more than 200% of the poverty level The clinic cannot treat you if you are pregnant or think you are pregnant  Sexually transmitted diseases are not treated at the clinic.    Dental Care:  Organization         Address     Phone             Notes  [facility] Department of [facility] [place] [phone] Accepts children up to age 29 who are enrolled in [place] or [place]; pregnant women with a Medicaid card; and children who have applied for Medicaid or [place] Health Choice, but were declined, whose parents can pay a reduced fee at time of service.  [facility] Department of [facility]  [address], [place] [phone] Accepts children up to age 80 who are enrolled in [place] or [place]; pregnant women with a Medicaid card; and children who have  applied for Medicaid or [place] Health Choice, but were declined, whose parents can pay a reduced fee at time of service.  [place] Adult Dental Access PROGRAM  [place] [phone] Patients are seen by appointment only. Walk-ins are not accepted. [place] will see patients 41 years of age and older. Monday - Tuesday (8am-5pm) Most Wednesdays (8:30-5pm) $30 per visit, cash only  [facility] Adult Dental Access PROGRAM  [address], [facility] [phone] Patients are seen by appointment only. Walk-ins are not accepted. [place] will see patients 42 years of age and older. One Wednesday Evening (Monthly: Volunteer Based).  $30 per visit, cash only  [place]  [phone] for adults; Children under age 30, call Graduate Pediatric Dentistry at [phone]. Children aged 63-14, please call [phone] to request a pediatric application.  Dental services are provided in all areas of dental care including fillings, crowns and bridges, complete and partial dentures, implants, gum treatment, root canals, and extractions. Preventive care is also provided. Treatment is provided to both adults and children. Patients are selected via a lottery and there is often a waiting list.   [facility] [address] [place]  [phone] [email or web address]   [facility] [address] [place], [place] [phone], Ext. [phone] Second and Fourth Thursday of each month, opens at 6:30 AM; Clinic ends at 9 AM.  Patients are seen on a first-come first-served basis, and a limited number are seen during each clinic.   [facility]  [address] Hillard Danker [place], [place] [phone]   Eligibility Requirements You must have lived in [place], [place], or [place] counties for at least the last three months.   You cannot be eligible for state or federal sponsored [employer], including [employer], [place], or [employer].   You generally cannot be eligible for healthcare insurance through your employer.    How to apply: Eligibility screenings are held every Tuesday and Wednesday afternoon from 1:00 pm  until 4:00 pm. You do not need an appointment for the interview!  [facility] [address] [place], [place]   [place]  [place] Department  [place]  [phone]    Behavioral Health Resources in the Community: Intensive Outpatient Programs Organization         Address     Phone             Notes  [place]. [address], [place], [place] [phone]   [facility] Outpatient [address], [place], [place]   ADS: Alcohol & Drug Svcs [address], [place], [place]   [place] [address]  [place], [place] or [phone]     Substance Abuse Resources Organization         Address     Phone             Notes  Alcohol and Drug Services  [phone]   [place]  [phone]   [facility]   Chinita Pester  [phone]   Residential & Outpatient Substance Abuse Program  [phone]   Psychological Services Organization         Address     Phone             Notes  [facility]  [place]  [phone]   [place] [address] [place] or [phone]    Mobile Crisis Teams Organization         Address     Phone             Notes  [facility], Mobile Crisis Care Unit  [phone]   [facility]  [address] [place], [place]   [place] Levels [address], [place] [phone]    Self-Help/Support Groups Organization         Address      Phone             Notes  [place]. of [place] - variety of support groups  [place] Call for more information  Narcotics Anonymous (NA), [facility] [address], [employer] [place]  2 meetings at this location   Special educational needs teacher         Address     Phone             Notes  [facility] Residential Treatment [place],    [place]  [phone]   [facility]  [address], [place] [id number], [place], [place]   [place], [place] [phone] Admissions: 8am-3pm M-F  Incentives Substance [place] [address],    [place], [place] [phone]   [facility] [address] Jadene Pierini [place], [place]   [facility] [address],  [place], [place]   [facility]ight Programs - Intensive Outpatient [address] Kristeen [address], [place], [place]   [facility] [place].,  [place], [place] or [phone]   [facility] Services (RTS) [address], [place], [place] Accepts Medicaid  [facility] [place] [address],  [place] [phone] Substance Abuse/Addiction Treatment   [facility] Resources Organization  Address     Phone             Notes  [facility]  [phone]   Domenic Schwab, PhD [address] Arlis [place], [place]   [phone] or [phone]   [place], [place] [phone]   [place] [address], [place], [place] [phone] Insurance/Medicaid/sponsorship through [facility] and Families [address], [address] [place]                                    [place], [place] [phone] [place] [address], [place] [phone]    Dr. Adele Schilder  [phone]   [facility] Dept. 1) [address], [place] 2) [address] [place] 3)  [place] [address], [place] [phone]  [phone]   Cut Off [phone] or [phone] (After Hours)     [place]: Abuse and Neglect Organization         Address     Phone             Notes  Child/Elder Abuse Hotline  (581)849-8978   Family Abuse Services  [phone] 24 hour crisis line  [facility]  [place]  [place] & Substance Use Organization         Address     Phone             Notes  [place] [employer]   [phone] 24 hour crisis line  Advance Access  [address], [employer] [phone] Monday- Friday, walk-in,  8am-8pm  RTSA Detoxification & Crisis Stabilization  123XX123   Alcoholics Anonymous (  123XX123 [place] [employer]  Narcotics Anonymous  [place] & Urgent Care Centers Organization         Address     Phone             Notes  [place] Department  [place] at [place]   [place]   [facility]  [phone] Uninsured patients meeting eligibility requirement  [place]  Mariano Colon  [place]  [facility]   [place]  [phone]     Additional [facility] Resources Organization         Address     Phone             Notes  [place] and [place]  [place]  [phone] Medicaid, Nutrition, Medicine Assistance,  Utility Assistance  [place] [phone]   [place] [facility]  [phone]   [place]  [place] of [place]  [phone] Adult & family shelter, food, utility &  rent assistance  24 Hour crisis line for those facing homelessness  7624772508   [facility] Transit  [phone] Roxy Manns, [facility] public transportation system  Homecare Providers  [phone] HIV/AIDS Case Management, FREE HIV SCREEN  Medication Management  2105471375 Ongoing medication assistance for patients meeting eligibility requirements  Medication Drop Box Locations: [place] Dept., [place] [facility] Police Dept., [place]., [facility] office  Safely rid of unused medications  [employer]  2044673608 Crisis assistance, medication, housing, food, utility assistance  [place].  [facility])  [phone]

## 2015-06-24 NOTE — ED Notes (Signed)
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

## 2015-06-24 NOTE — ED Notes (Signed)
Pt states that she's starting to feel shaky and nervous

## 2016-08-23 IMAGING — CR DG CHEST 2V
2 series · 2 of 2 positions shown · non-contrast
Comparison: 09/12/2014

CLINICAL DATA: New onset Mid chest pain yesterday.

EXAM:
CHEST  2 VIEW

[w chest pa]
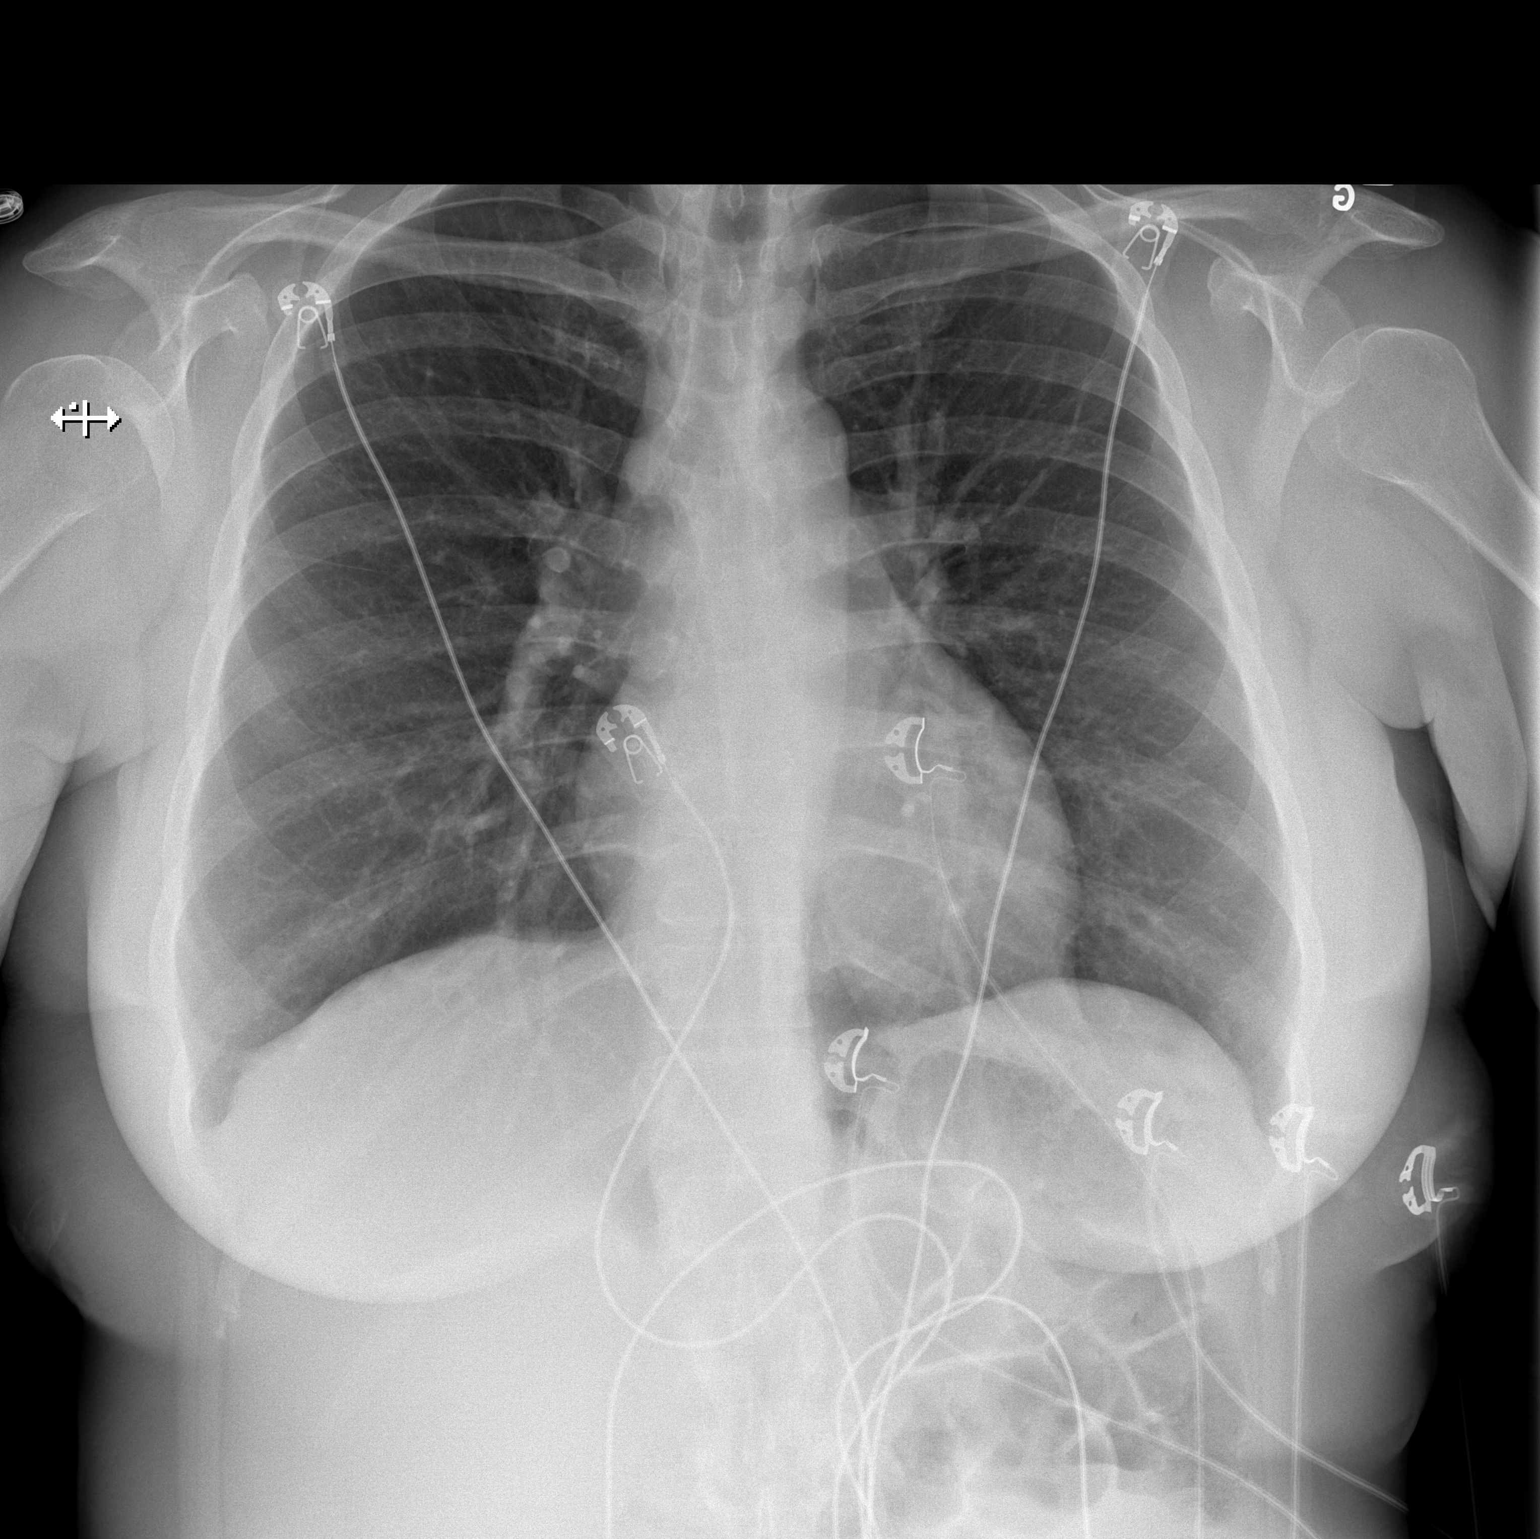

[w chest lat]
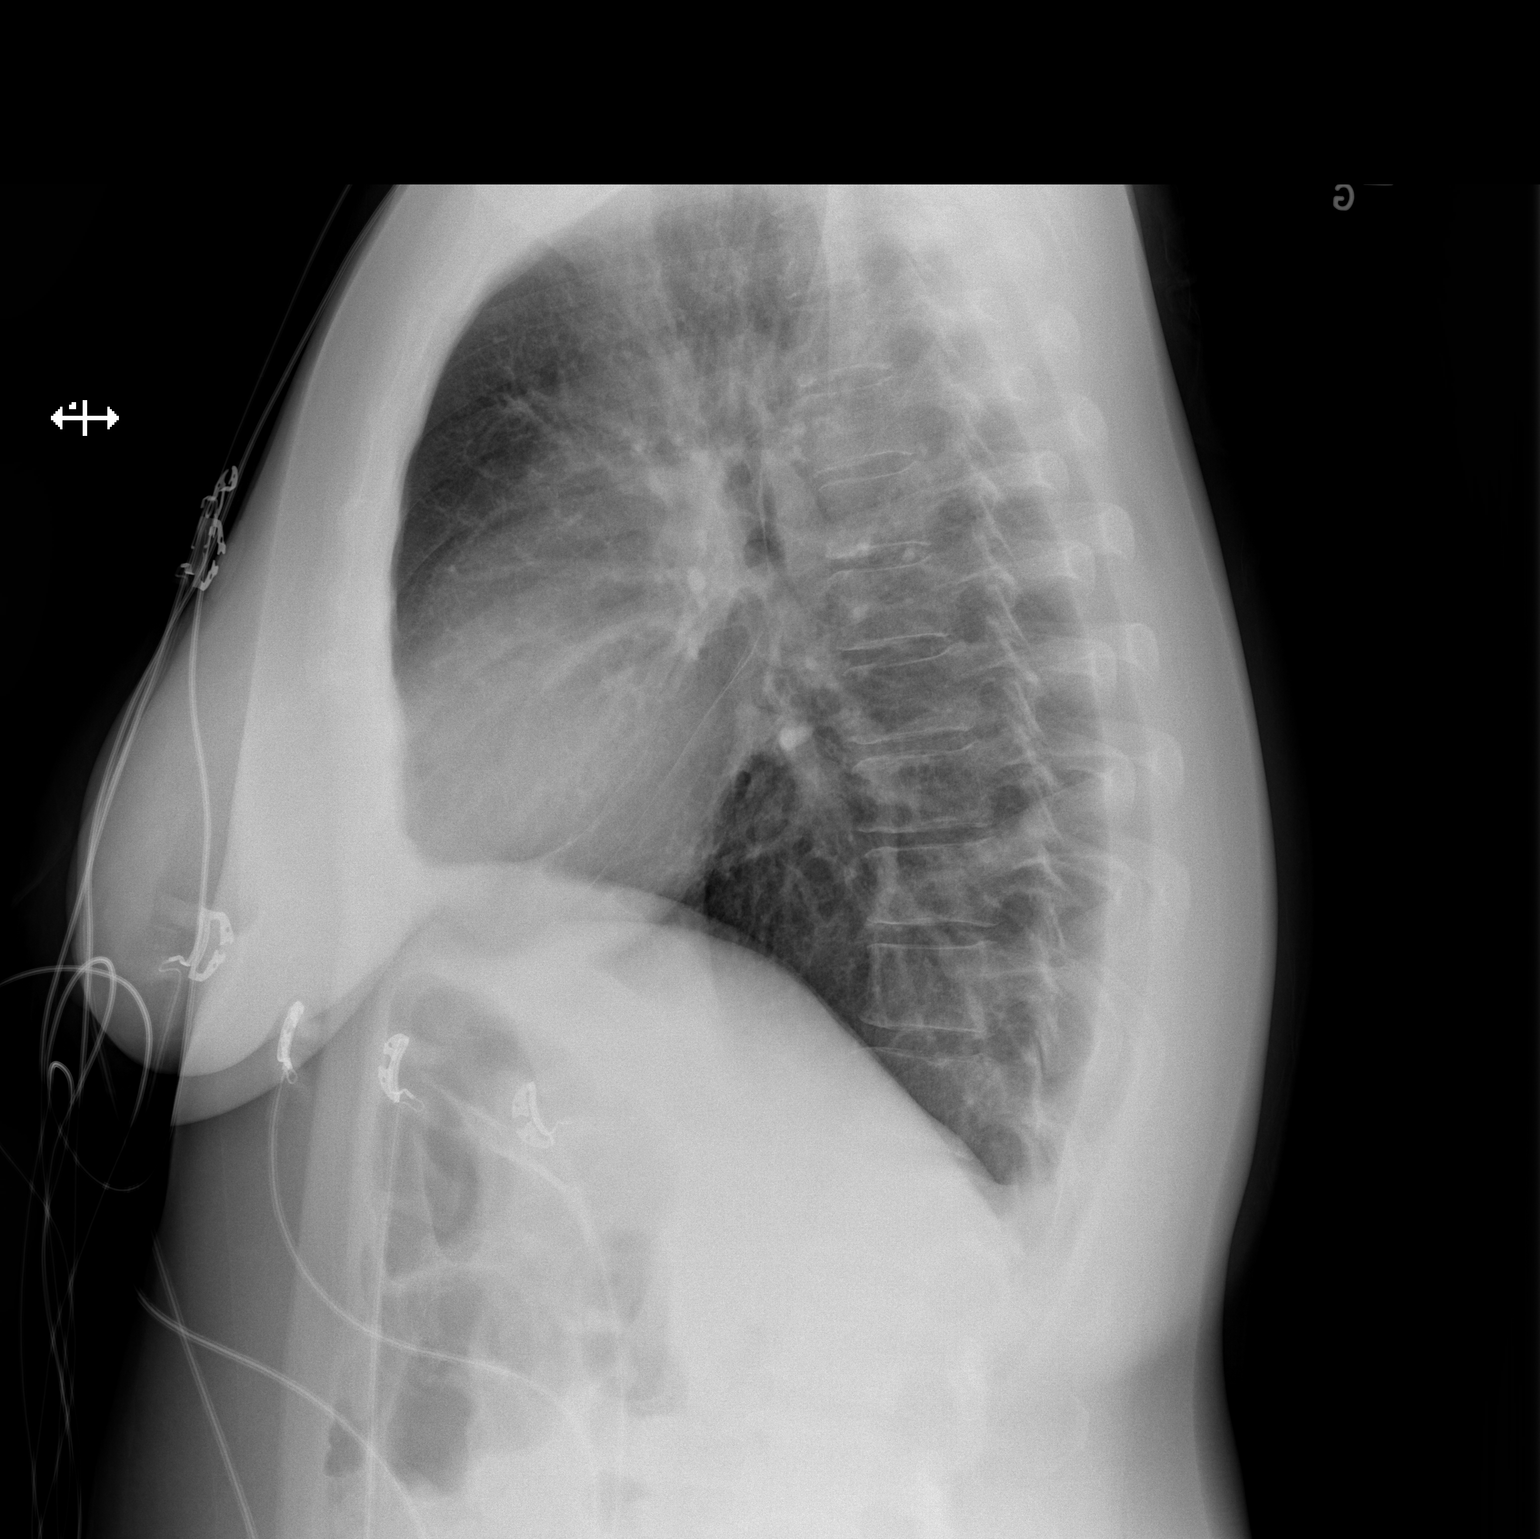

[2 of 2 positions shown; findings below may reference images not displayed]

FINDINGS: The heart size and mediastinal contours are within normal limits.
Both lungs are clear. The visualized skeletal structures are
unremarkable.
IMPRESSION: No active cardiopulmonary disease.

## 2016-11-06 IMAGING — CT CT RENAL STONE PROTOCOL
2 of 4 series · 15 of 46 positions shown, 17 images · non-contrast
Comparison: None.

CLINICAL DATA: Abdominal pain, mostly lower abdominal pain.
Recurrent bloody vomiting and diarrhea. History of ulcers. ETOH
dependent.

EXAM:
CT ABDOMEN AND PELVIS WITHOUT CONTRAST
TECHNIQUE: Multidetector CT imaging of the abdomen and pelvis was performed
following the standard protocol without IV contrast.

[Series 2: stone study 5.0 i30f 1 · axial · 0.82mm/px · z∈[-462,-22]mm · 12 of 98 slices shown, 14 images]
[im 5/98  soft-tissue]
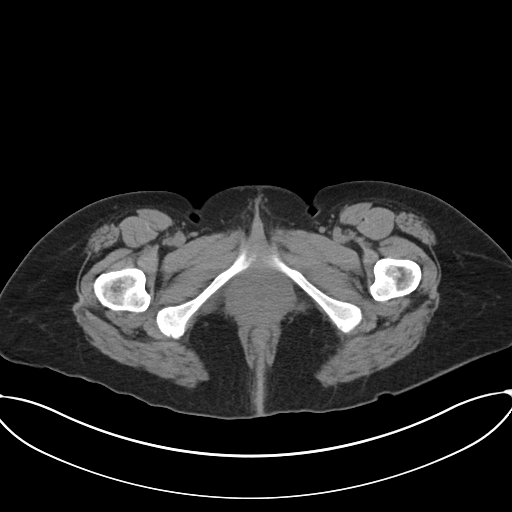
[im 5/98  bone]
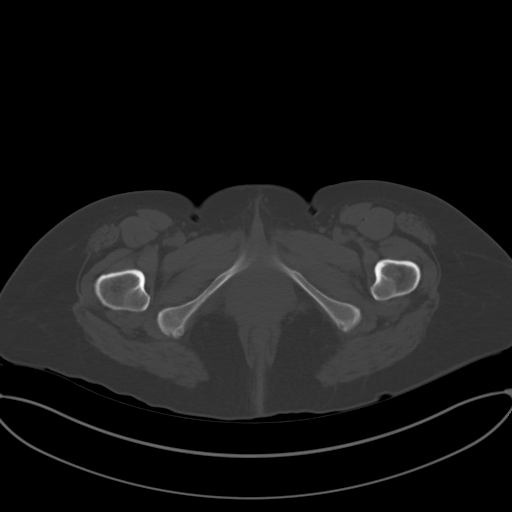
[im 13/98  soft-tissue]
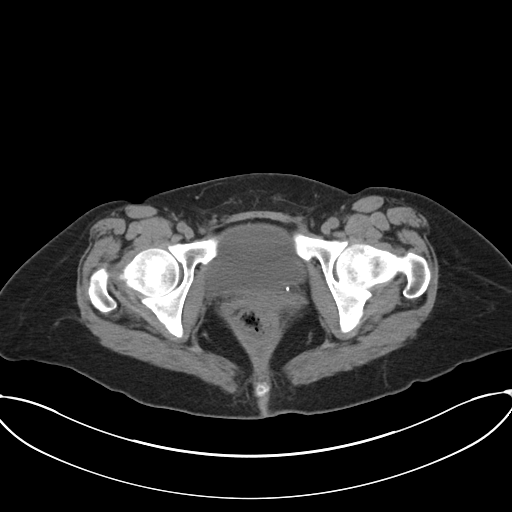
[im 22/98  soft-tissue]
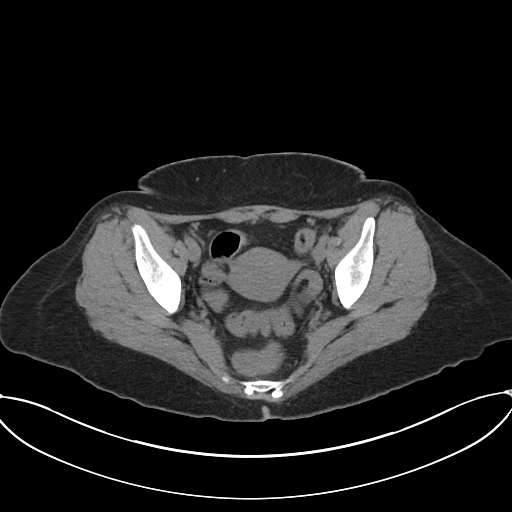
[im 30/98  soft-tissue]
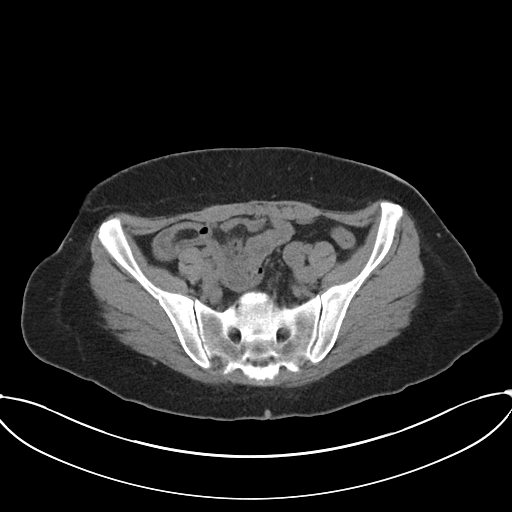
[im 38/98  soft-tissue]
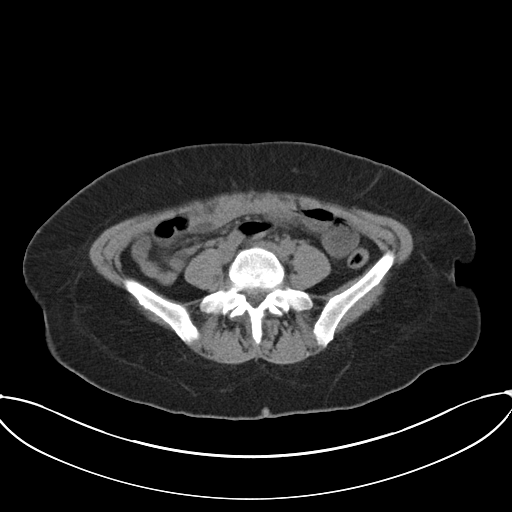
[im 47/98  soft-tissue]
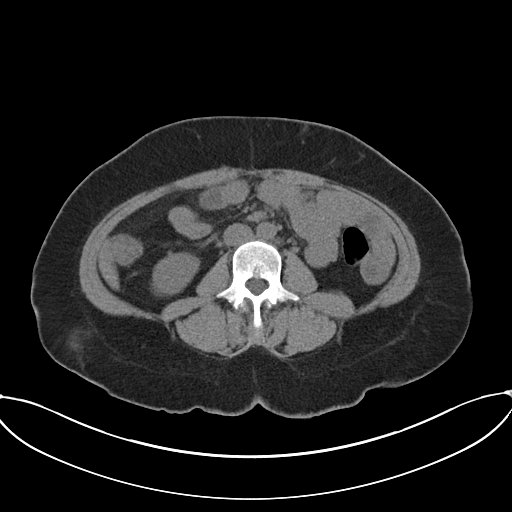
[im 51/98  soft-tissue]
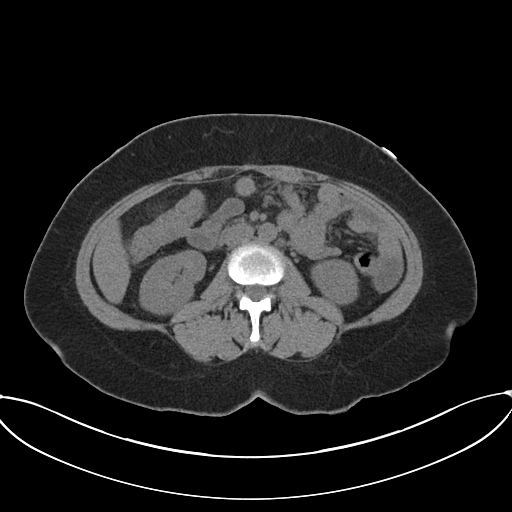
[im 60/98  soft-tissue]
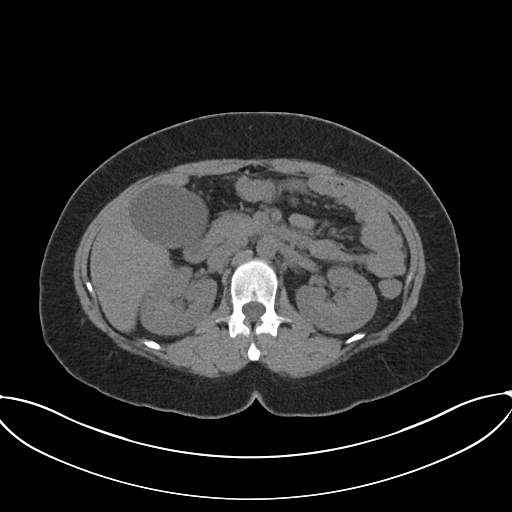
[im 68/98  soft-tissue]
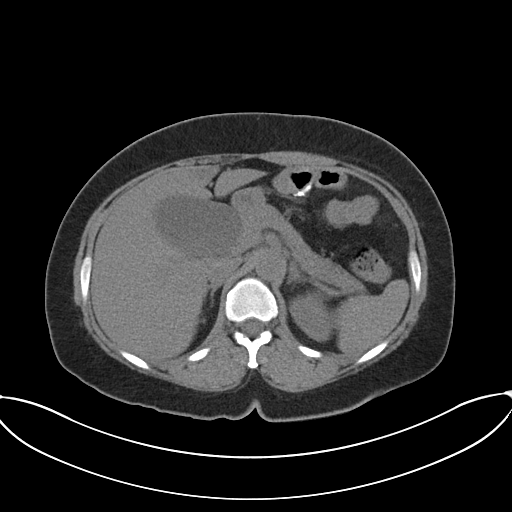
[im 68/98  bone]
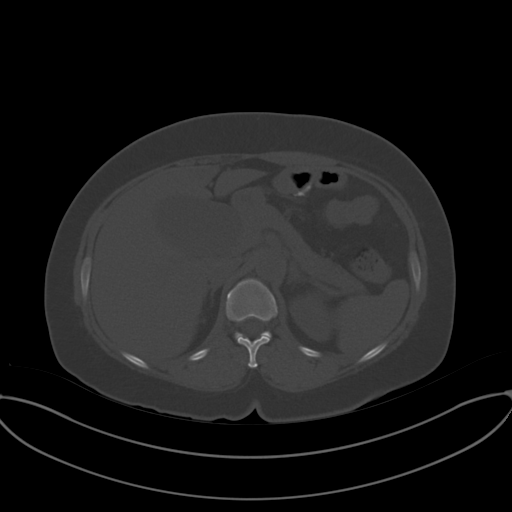
[im 76/98  soft-tissue]
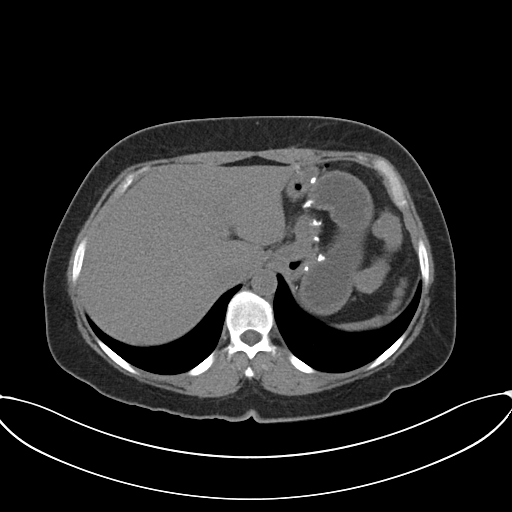
[im 85/98  soft-tissue]
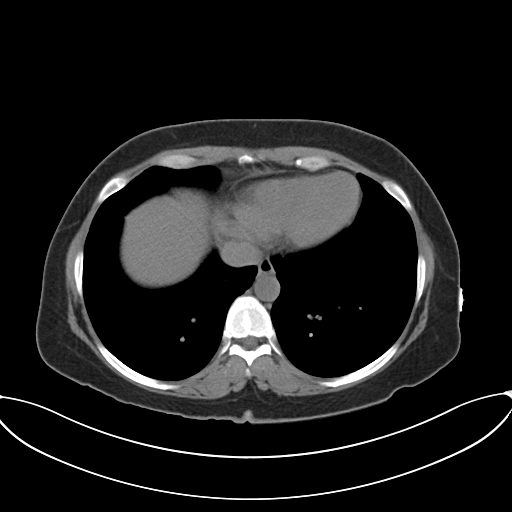
[im 93/98  soft-tissue]
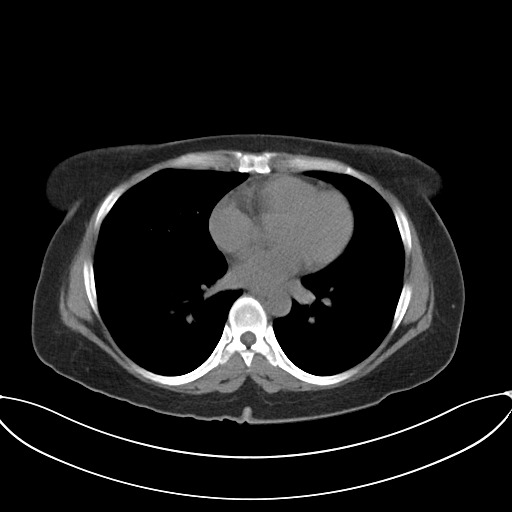

[Series 5: coronal soft tissue · coronal · 0.82mm/px · 3 of 70 slices shown]
[im 24/70  soft-tissue]
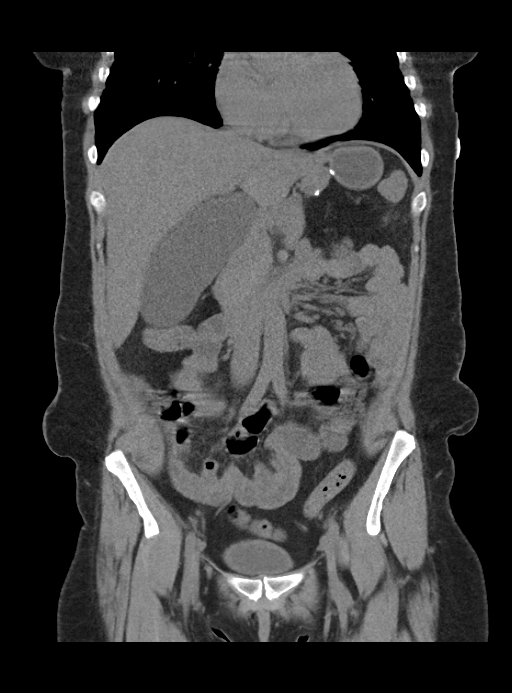
[im 31/70  soft-tissue]
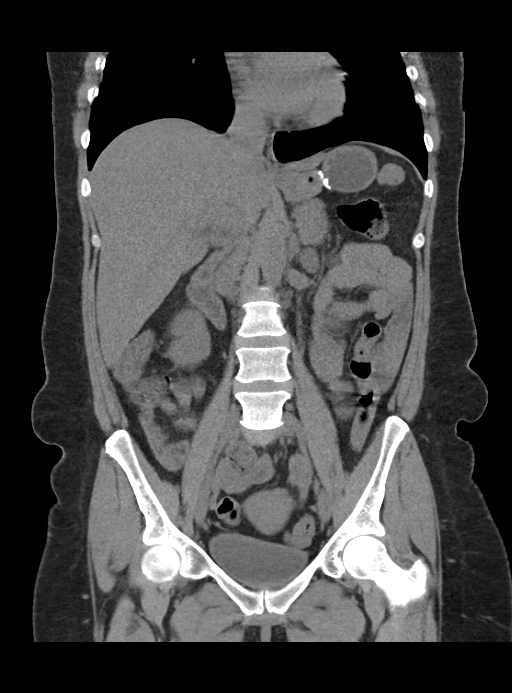
[im 39/70  soft-tissue]
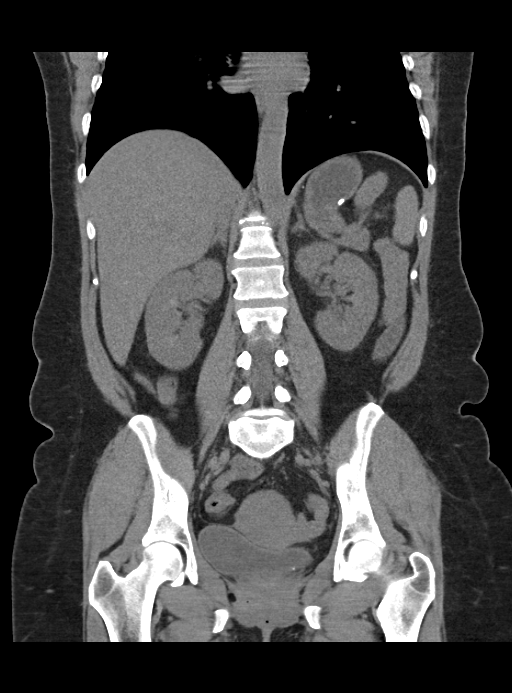

[15 of 46 positions shown; findings below may reference images not displayed]

FINDINGS: Gallbladder is moderately distended but there is no pericholecystic
fluid or other secondary signs of an acute cholecystitis. No bile
duct dilatation. No bile duct stone seen.

Small low density focus within the left hepatic lobe is most likely
focal fatty infiltration. Liver is otherwise within normal limits
for a noncontrast study. Spleen, pancreas, and adrenal glands appear
normal. Kidneys are unremarkable without stone or hydronephrosis. No
ureteral or bladder calculi identified. Bladder appears normal. No
bladder stone.

There are surgical changes of a previous gastric bypass. The
excluded portion of the stomach is mildly distended with fluid but
otherwise unremarkable. Remainder of the bowel is normal in caliber
and configuration. No bowel wall thickening or evidence of bowel
wall inflammation.

Appendix is not convincingly seen and there is patient motion
artifact which limits characterization of the right lower quadrant,
but there are no inflammatory changes or free fluid about the cecum
to suggest acute appendicitis.

No free fluid or abscess collections seen. No free intraperitoneal
air. No enlarged lymph nodes. Abdominal aorta is normal in caliber.
Lung bases are clear. No significant osseous abnormality.
IMPRESSION: 1. Gallbladder is moderately distended but otherwise unremarkable.
No pericholecystic fluid or other secondary signs of an acute
cholecystitis. No calcified gallstones seen. No biliary dilatation.
If any localizable right upper quadrant pain, would consider right
upper quadrant ultrasound for further characterization of this
gallbladder distention.
2. Surgical changes of gastric bypass. No evidence of surgical
complicating feature.
3. Remainder of the abdomen and pelvis is unremarkable. No free
fluid or inflammatory change. No bowel obstruction. No renal or
ureteral calculi.
4. Right lower quadrant is difficult to evaluate due to patient
motion artifact but there are no secondary signs of acute
appendicitis. If clinically concerned for an acute appendicitis,
would recommend repeat CT examination of the pelvis with oral and
intravascular contrast.

## 2017-02-12 ENCOUNTER — Emergency Department (HOSPITAL_COMMUNITY)
Admission: EM | Admit: 2017-02-12 | Discharge: 2017-02-14 | Disposition: A | Discharge status: Other Acute Inpatient Hospital | Payer: Federal, State, Local not specified - Other | Attending: Emergency Medicine | Admitting: Emergency Medicine

## 2017-02-12 ENCOUNTER — Emergency Department (HOSPITAL_COMMUNITY): Payer: Self-pay

## 2017-02-12 DIAGNOSIS — Z046 Encounter for general psychiatric examination, requested by authority: Secondary | ICD-10-CM

## 2017-02-12 DIAGNOSIS — Y907 Blood alcohol level of 200-239 mg/100 ml: Secondary | ICD-10-CM | POA: Insufficient documentation

## 2017-02-12 DIAGNOSIS — Y999 Unspecified external cause status: Secondary | ICD-10-CM | POA: Insufficient documentation

## 2017-02-12 DIAGNOSIS — Y939 Activity, unspecified: Secondary | ICD-10-CM | POA: Insufficient documentation

## 2017-02-12 DIAGNOSIS — R45851 Suicidal ideations: Secondary | ICD-10-CM

## 2017-02-12 DIAGNOSIS — Z87891 Personal history of nicotine dependence: Secondary | ICD-10-CM | POA: Insufficient documentation

## 2017-02-12 DIAGNOSIS — F10920 Alcohol use, unspecified with intoxication, uncomplicated: Secondary | ICD-10-CM | POA: Insufficient documentation

## 2017-02-12 DIAGNOSIS — T148XXA Other injury of unspecified body region, initial encounter: Secondary | ICD-10-CM | POA: Insufficient documentation

## 2017-02-12 DIAGNOSIS — F1024 Alcohol dependence with alcohol-induced mood disorder: Secondary | ICD-10-CM | POA: Diagnosis present

## 2017-02-12 DIAGNOSIS — F332 Major depressive disorder, recurrent severe without psychotic features: Secondary | ICD-10-CM | POA: Insufficient documentation

## 2017-02-12 DIAGNOSIS — S0083XA Contusion of other part of head, initial encounter: Secondary | ICD-10-CM | POA: Insufficient documentation

## 2017-02-12 DIAGNOSIS — X58XXXA Exposure to other specified factors, initial encounter: Secondary | ICD-10-CM | POA: Insufficient documentation

## 2017-02-12 DIAGNOSIS — C169 Malignant neoplasm of stomach, unspecified: Secondary | ICD-10-CM | POA: Insufficient documentation

## 2017-02-12 DIAGNOSIS — S0990XA Unspecified injury of head, initial encounter: Secondary | ICD-10-CM | POA: Insufficient documentation

## 2017-02-12 DIAGNOSIS — Y929 Unspecified place or not applicable: Secondary | ICD-10-CM | POA: Insufficient documentation

## 2017-02-12 LAB — COMPREHENSIVE METABOLIC PANEL
ALBUMIN: 3.6 g/dL (ref 3.5–5.0)
ALT: 39 U/L (ref 14–54)
AST: 92 U/L — AB (ref 15–41)
Alkaline Phosphatase: 97 U/L (ref 38–126)
Anion gap: 11 (ref 5–15)
BUN: 7 mg/dL (ref 6–20)
CHLORIDE: 108 mmol/L (ref 101–111)
CO2: 22 mmol/L (ref 22–32)
Calcium: 7.9 mg/dL — ABNORMAL LOW (ref 8.9–10.3)
Creatinine, Ser: 0.57 mg/dL (ref 0.44–1.00)
GFR calc Af Amer: >60 mL/min (ref >60)
GFR calc non Af Amer: >60 mL/min (ref >60)
GLUCOSE: 101 mg/dL — AB (ref 65–99)
POTASSIUM: 3.1 mmol/L — AB (ref 3.5–5.1)
Sodium: 141 mmol/L (ref 135–145)
Total Bilirubin: 0.5 mg/dL (ref 0.3–1.2)
Total Protein: 6.7 g/dL (ref 6.5–8.1)

## 2017-02-12 LAB — CBC
HEMATOCRIT: 27.5 % — AB (ref 36.0–46.0)
Hemoglobin: 8.8 g/dL — ABNORMAL LOW (ref 12.0–15.0)
MCH: 25.3 pg — AB (ref 26.0–34.0)
MCHC: 32 g/dL (ref 30.0–36.0)
MCV: 79 fL (ref 78.0–100.0)
Platelets: 426 10*3/uL — ABNORMAL HIGH (ref 150–400)
RBC: 3.48 MIL/uL — ABNORMAL LOW (ref 3.87–5.11)
RDW: 16.5 % — AB (ref 11.5–15.5)
WBC: 6 10*3/uL (ref 4.0–10.5)

## 2017-02-12 NOTE — ED Notes (Signed)
Pt unable to void @ this time

## 2017-02-12 NOTE — ED Notes (Signed)
Pt denies recreational drugs.

## 2017-02-12 NOTE — ED Triage Notes (Signed)
Per [facility], pt recently dx'd with stomach cancer.  Per IVC papers, pt stated she wouldn't be here long and is going to drink herself to death.  Pt with multiple bruises & air splint to right ankle.

## 2017-02-12 NOTE — ED Notes (Signed)
Bed: LO31 Expected date:  Expected time:  Means of arrival:  Comments: EMS intoxicated IVC
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
## 2017-02-12 NOTE — ED Notes (Signed)
Patient transported to X-ray 

## 2017-02-12 NOTE — ED Triage Notes (Signed)
Patient arrives by [facility] with deputy under IVC-papers state patient is suicidal is "drinking herself to death". Patient is angry and withdrawn-would not let EMS get vital signs-they were obtained on patient arrival to the ED. Patient is covered in bruises to upper extremities.

## 2017-02-13 ENCOUNTER — Encounter (HOSPITAL_COMMUNITY): Payer: Self-pay | Admitting: *Deleted

## 2017-02-13 ENCOUNTER — Emergency Department (HOSPITAL_COMMUNITY): Payer: Self-pay

## 2017-02-13 LAB — SALICYLATE LEVEL: Salicylate Lvl: <7 mg/dL (ref 2.8–30.0)

## 2017-02-13 LAB — CBC WITH DIFFERENTIAL/PLATELET
BASOS ABS: 0.1 10*3/uL (ref 0.0–0.1)
BASOS PCT: 2 %
EOS ABS: 0.1 10*3/uL (ref 0.0–0.7)
EOS PCT: 1 %
HEMATOCRIT: 26 % — AB (ref 36.0–46.0)
HEMOGLOBIN: 8.4 g/dL — AB (ref 12.0–15.0)
Lymphocytes Relative: 26 %
Lymphs Abs: 1.6 10*3/uL (ref 0.7–4.0)
MCH: 25.7 pg — ABNORMAL LOW (ref 26.0–34.0)
MCHC: 32.3 g/dL (ref 30.0–36.0)
MCV: 79.5 fL (ref 78.0–100.0)
Monocytes Absolute: 0.4 10*3/uL (ref 0.1–1.0)
Monocytes Relative: 6 %
NEUTROS PCT: 65 %
Neutro Abs: 4 10*3/uL (ref 1.7–7.7)
Platelets: 363 10*3/uL (ref 150–400)
RBC: 3.27 MIL/uL — ABNORMAL LOW (ref 3.87–5.11)
RDW: 16.6 % — ABNORMAL HIGH (ref 11.5–15.5)
WBC: 6.2 10*3/uL (ref 4.0–10.5)

## 2017-02-13 LAB — RAPID URINE DRUG SCREEN, HOSP PERFORMED
AMPHETAMINES: NOT DETECTED
Barbiturates: POSITIVE — AB
Benzodiazepines: POSITIVE — AB
Cocaine: NOT DETECTED
Opiates: NOT DETECTED
TETRAHYDROCANNABINOL: NOT DETECTED

## 2017-02-13 LAB — ACETAMINOPHEN LEVEL: Acetaminophen (Tylenol), Serum: 16 ug/mL (ref 10–30)

## 2017-02-13 LAB — PREGNANCY, URINE: PREG TEST UR: NEGATIVE

## 2017-02-13 LAB — ETHANOL: ALCOHOL ETHYL (B): 368 mg/dL — AB (ref <5)

## 2017-02-13 MED ORDER — LORAZEPAM 2 MG/ML IJ SOLN
0.0000 mg | Freq: Four times a day (QID) | INTRAMUSCULAR | Status: DC
Start: 2017-02-13 — End: 2017-02-14
  Administered 2017-02-13 (×2): 1 mg via INTRAVENOUS
  Filled 2017-02-13: qty 1

## 2017-02-13 MED ORDER — TRAZODONE HCL 50 MG PO TABS
25.0000 mg | ORAL_TABLET | Freq: Every day | ORAL | Status: DC
Start: 2017-02-13 — End: 2017-02-14
  Filled 2017-02-13: qty 1

## 2017-02-13 MED ORDER — ONDANSETRON HCL 4 MG PO TABS
8.0000 mg | ORAL_TABLET | Freq: Three times a day (TID) | ORAL | Status: DC | PRN
  Administered 2017-02-13 – 2017-02-14 (×3): 8 mg via ORAL
  Filled 2017-02-13 (×3): qty 2

## 2017-02-13 MED ORDER — POTASSIUM CHLORIDE 20 MEQ/15ML (10%) PO SOLN
40.0000 meq | Freq: Once | ORAL | Status: AC
  Administered 2017-02-13: 40 meq via ORAL
  Filled 2017-02-13: qty 30

## 2017-02-13 MED ORDER — FOLIC ACID 1 MG PO TABS
1.0000 mg | ORAL_TABLET | Freq: Every day | ORAL | Status: DC
  Administered 2017-02-13: 1 mg via ORAL
  Filled 2017-02-13: qty 1

## 2017-02-13 MED ORDER — BUSPIRONE HCL 10 MG PO TABS
10.0000 mg | ORAL_TABLET | Freq: Three times a day (TID) | ORAL | Status: DC
  Filled 2017-02-13: qty 1

## 2017-02-13 MED ORDER — PANTOPRAZOLE SODIUM 40 MG PO TBEC
40.0000 mg | DELAYED_RELEASE_TABLET | Freq: Every day | ORAL | Status: DC
  Administered 2017-02-13 – 2017-02-14 (×2): 40 mg via ORAL
  Filled 2017-02-13 (×2): qty 1

## 2017-02-13 MED ORDER — ACETAMINOPHEN 325 MG PO TABS
650.0000 mg | ORAL_TABLET | Freq: Four times a day (QID) | ORAL | Status: DC | PRN
  Administered 2017-02-13 – 2017-02-14 (×2): 650 mg via ORAL
  Filled 2017-02-13 (×2): qty 2

## 2017-02-13 MED ORDER — THIAMINE HCL 100 MG/ML IJ SOLN: 100.0000 mg | Freq: Every day | INTRAMUSCULAR | Status: DC

## 2017-02-13 MED ORDER — WHITE PETROLATUM GEL
Status: DC | PRN
  Filled 2017-02-13 (×2): qty 5

## 2017-02-13 MED ORDER — SODIUM CHLORIDE 0.9 % IV BOLUS (SEPSIS)
1000.0000 mL | Freq: Once | INTRAVENOUS | Status: AC
  Administered 2017-02-13: 1000 mL via INTRAVENOUS

## 2017-02-13 MED ORDER — BUPROPION HCL ER (SR) 150 MG PO TB12
150.0000 mg | ORAL_TABLET | Freq: Two times a day (BID) | ORAL | Status: DC
  Administered 2017-02-13: 150 mg via ORAL
  Filled 2017-02-13: qty 1

## 2017-02-13 MED ORDER — LORAZEPAM 1 MG PO TABS: 0.0000 mg | ORAL_TABLET | Freq: Two times a day (BID) | ORAL | Status: DC

## 2017-02-13 MED ORDER — LORAZEPAM 2 MG/ML IJ SOLN: 0.0000 mg | Freq: Two times a day (BID) | INTRAMUSCULAR | Status: DC

## 2017-02-13 MED ORDER — VITAMIN B-1 100 MG PO TABS
100.0000 mg | ORAL_TABLET | Freq: Every day | ORAL | Status: DC
  Administered 2017-02-13 – 2017-02-14 (×2): 100 mg via ORAL
  Filled 2017-02-13: qty 1

## 2017-02-13 MED ORDER — ONDANSETRON 4 MG PO TBDP
4.0000 mg | ORAL_TABLET | Freq: Once | ORAL | Status: AC
  Administered 2017-02-13: 4 mg via ORAL
  Filled 2017-02-13: qty 1

## 2017-02-13 MED ORDER — NICOTINE 21 MG/24HR TD PT24: 21.0000 mg | MEDICATED_PATCH | Freq: Every day | TRANSDERMAL | Status: DC

## 2017-02-13 MED ORDER — LORAZEPAM 1 MG PO TABS
0.0000 mg | ORAL_TABLET | Freq: Four times a day (QID) | ORAL | Status: DC
  Administered 2017-02-13: 2 mg via ORAL
  Administered 2017-02-13: 1 mg via ORAL
  Filled 2017-02-13: qty 1
  Filled 2017-02-13: qty 2
  Filled 2017-02-13: qty 1

## 2017-02-13 MED ORDER — ONDANSETRON HCL 4 MG/2ML IJ SOLN
4.0000 mg | Freq: Once | INTRAMUSCULAR | Status: AC
  Administered 2017-02-13: 4 mg via INTRAVENOUS
  Filled 2017-02-13: qty 2

## 2017-02-13 NOTE — ED Notes (Signed)
TTS at bedside. 

## 2017-02-13 NOTE — ED Notes (Signed)
Pt actively vomiting. PIV inserted and lorazepam given.

## 2017-02-13 NOTE — BH Assessment (Addendum)
Assessment Note  Shannon Cervantes is an 46 y.o. female that presents this date under IVC. Per IVC: "Petitioner advised her friend is suicidal, just learned she is in stage four cancer stating she "will not be here long." Drinking herself to death, today has unexplained injuries broken bones and she is trying to hurt herself". Patient admits to S/I but states her plan is to "drink herself to death." Patient denies any previous attempts/gestures at self harm although per note review stated she has 1 prior attempt. Patient is observed to be exhibiting tremors and reports severe withdrawals to include nausea, substance induced AVH and diarrhea. Patient stated she had been maintaining her sobriety while residing in [place] for the last two years but relapsed last November 2017. Patient states she then started consuming up to 1 pint of liquor daily until last week when her family/friends relocated her to [place], [place] to assist with her recovery efforts. Patient stated she attempted to maintain her sobriety for two days but relapsed three days ago reported drinking "1 or 2 pints of liquor" daily until yesterday 02/12/17. Patient is vague in reference to her S/I and denies any H/I, patient does endorse active AVH at times, due to withdrawals. Patient denies any AVH at the time of assessment but does report "ringing in her ears." Patient states she was on medication/s for depression while in [place] but discontinued those medications in November when she relapsed. Patient per note review, was admitted to [facility] in 2016 for depression, S/I and excessive alcohol use. Patient also was seen OP by [facility] in 2016 for one year who assisted with medication management. This date patient does report ongoing depression with symptoms to include guilt, worthlessness and feeling hopeless. Patient stated she has "severe health problems" but would not elaborate on her current heath issues but did  State "I am dying." Patient is  requesting asstance with withdrawals and medications for depression to be followed up with possible OP services on discharge. Patient endorses depressive symptoms as: feeling sadness all or most of the time, feelings of guilt and loneliness, fatigue, insomnia (sleeping 3-4 hours per night), isolating from others, feeling helpless, irritability, and decrease in hygiene. Patient also states that she has anxiety which had improved until recently when she noticed an increase in anxiety attacks. Patient states that she drinks throughout the day and she often drinks until she blacks out or goes to sleep. Patient states that she has tried to stop but is not able to cope with the physical withdrawal symptoms and she drinks to stop withdrawal symptoms. Patient denies any legal history or history of aggression. Patient denies current SI/HI but states that she has visual hallucinations at times. Patient states "I know it's because of the drinking, I see people, but I know they are not there." Patient denies any delusions or hallucinations at this time. Case was staffed with Reita Cliche DNP who recommended patient be re-evaluated in the a.m.Marland Kitchen   Diagnosis: MDD without psychotic features, Alcohol abuse severe  Past Medical History:  Past Medical History:  Diagnosis Date  . Alcohol abuse   . Anxiety   . Depression   . Foot fracture 01-07-2011  . Hyperlipidemia   . Hypertensive cardiomyopathy ([place])     Past Surgical History:  Procedure Laterality Date  . CESAREAN SECTION    . GASTRIC BYPASS      Family History:  Family History  Problem Relation Age of Onset  . Hypertension Mother   . Heart disease  Mother   . Hyperlipidemia Mother   . Heart disease Father   . Alcohol abuse Brother   . Alcohol abuse Paternal Uncle     Social History:  reports that she has quit smoking. Her smoking use included Cigarettes. She smoked 0.01 packs per day. She has never used smokeless tobacco. She reports that she drinks alcohol.  She reports that she does not use drugs.  Additional Social History:  Alcohol / Drug Use Pain Medications: pt denies abuse - see PTA meds list Prescriptions: pt denies abuse = see PTA meds list Over the Counter: pt denies abuse - see PTA meds list History of alcohol / drug use?: Yes Longest period of sobriety (when/how long): 2 yrs Negative Consequences of Use: Personal relationships Withdrawal Symptoms: Agitation, Nausea / Vomiting, Tremors, Diarrhea Substance #1 Name of Substance 1: Alcohol 1 - Age of First Use: 17 1 - Amount (size/oz): 1 pint of liquor 1 - Frequency: Daily 1 - Duration: Last two years 1 - Last Use / Amount: 02/12/17 1 pint liquor  CIWA: CIWA-Ar BP: 134/84 Pulse Rate: (!) 107 Nausea and Vomiting: mild nausea with no vomiting Tactile Disturbances: none Tremor: not visible, but can be felt fingertip to fingertip Auditory Disturbances: not present Paroxysmal Sweats: no sweat visible Visual Disturbances: not present Anxiety: no anxiety, at ease Headache, Fullness in Head: very mild Agitation: normal activity Orientation and Clouding of Sensorium: oriented and can do serial additions CIWA-Ar Total: 3 COWS:    Allergies:  Allergies  Allergen Reactions  . Suboxone [Buprenorphine Hcl-Naloxone Hcl] Shortness Of Breath    dizziness     Home Medications:  (Not in a hospital admission)  OB/GYN Status:  No LMP recorded. Patient has had an injection.  General Assessment Data [facility] of Assessment: [facility] TTS Assessment: In system Is this a Tele or Face-to-Face Assessment?: Face-to-Face Is this an Initial Assessment or a Re-assessment for this encounter?: Initial Assessment Marital status: Single Maiden name: NA Is patient pregnant?: No Pregnancy Status: No Living Arrangements: Parent (Living with mother) Can pt return to current living arrangement?: Yes Admission Status: Involuntary Is patient capable of signing voluntary admission?: Yes Referral Source:  Self/Family/Friend Insurance type: Facilities manager Exam ([place]) Medical Exam completed: Yes  Crisis Care Plan Living Arrangements: Parent (Living with mother) Legal Guardian:  (NA) Name of Psychiatrist: None Name of Therapist: None  Education Status Is patient currently in school?: No Current Grade:  (NA) Highest grade of school patient has completed:  (Some college) Name of school:  (NA) Contact person: NA  Risk to self with the past 6 months Suicidal Ideation: Yes-Currently Present (Passive drinking herself to death) Has patient been a risk to self within the past 6 months prior to admission? : No Suicidal Intent: No Has patient had any suicidal intent within the past 6 months prior to admission? : No Is patient at risk for suicide?: Yes Suicidal Plan?: No Has patient had any suicidal plan within the past 6 months prior to admission? : Yes Access to Means: Yes Specify Access to Suicidal Means: Excessive alcohol use What has been your use of drugs/alcohol within the last 12 months?: Current use Previous Attempts/Gestures: Yes How many times?: 1 (per history review) Other Self Harm Risks: NA Triggers for Past Attempts: Unknown Intentional Self Injurious Behavior: None Family Suicide History: No Recent stressful life event(s): Other (Comment) (Excessive SA use) Persecutory voices/beliefs?: No Depression: Yes Depression Symptoms: Guilt, Feeling worthless/self pity Substance abuse history  and/or treatment for substance abuse?: Yes (ETOH) Suicide prevention information given to non-admitted patients: Not applicable  Risk to Others within the past 6 months Homicidal Ideation: No Does patient have any lifetime risk of violence toward others beyond the six months prior to admission? : No Thoughts of Harm to Others: No Current Homicidal Intent: No Current Homicidal Plan: No Access to Homicidal Means: No Identified Victim: NA History of harm  to others?: No Assessment of Violence: None Noted Violent Behavior Description: NA Does patient have access to weapons?: No Criminal Charges Pending?: No Does patient have a court date: No Is patient on probation?: No  Psychosis Hallucinations: Auditory, Visual Delusions: None noted (Due to SA issues)  Mental Status Report Appearance/Hygiene: Disheveled Eye Contact: Fair Motor Activity: Freedom of movement Speech: Slow, Slurred Level of Consciousness: Drowsy Mood: Depressed Affect: Flat Anxiety Level: Moderate Thought Processes: Thought Blocking Judgement: Partial Orientation: Person, Place, Time Obsessive Compulsive Thoughts/Behaviors: None  Cognitive Functioning Concentration: Decreased Memory: Recent Intact, Remote Intact IQ: Average Insight: Fair Impulse Control: Poor Appetite: Poor Weight Loss: 10 Weight Gain: 0 Sleep: Decreased Total Hours of Sleep: 4 Vegetative Symptoms: None  ADLScreening [facility] Assessment Services) Patient's cognitive ability adequate to safely complete daily activities?: Yes Patient able to express need for assistance with ADLs?: Yes Independently performs ADLs?: Yes (appropriate for developmental age)  Prior Inpatient Therapy Prior Inpatient Therapy: Yes Prior Therapy Dates: 2016 Prior Therapy Facilty/Provider(s): [facility] Reason for Treatment: MH, SA issues  Prior Outpatient Therapy Prior Outpatient Therapy: Yes Prior Therapy Dates: 2016 (Per note review) Prior Therapy Facilty/Provider(s): [facility] Reason for Treatment: Med mang Does patient have an ACCT team?: No Does patient have Intensive In-House Services?  : No Does patient have [facility] services? : No Does patient have P4CC services?: No  ADL Screening (condition at time of admission) Patient's cognitive ability adequate to safely complete daily activities?: Yes Is the patient deaf or have difficulty hearing?: No Does the patient have difficulty seeing, even when  wearing glasses/contacts?: No Does the patient have difficulty concentrating, remembering, or making decisions?: No Patient able to express need for assistance with ADLs?: Yes Does the patient have difficulty dressing or bathing?: No Independently performs ADLs?: Yes (appropriate for developmental age) Does the patient have difficulty walking or climbing stairs?: No Weakness of Legs: None Weakness of Arms/Hands: None  Home Assistive Devices/Equipment Home Assistive Devices/Equipment: None  Therapy Consults (therapy consults require a physician order) PT Evaluation Needed: No OT Evalulation Needed: No SLP Evaluation Needed: No Abuse/Neglect Assessment (Assessment to be complete while patient is alone) Physical Abuse: Denies Verbal Abuse: Denies Sexual Abuse: Denies Exploitation of patient/patient's resources: Denies Self-Neglect: Denies Values / Beliefs Cultural Requests During Hospitalization: None Spiritual Requests During Hospitalization: None Consults Spiritual Care Consult Needed: No Social Work Consult Needed: No Regulatory affairs officer (For Healthcare) Does Patient Have a Medical Advance Directive?: No Would patient like information on creating a medical advance directive?: No - Patient declined    Additional Information 1:1 In Past 12 Months?: No CIRT Risk: No Elopement Risk: No     Disposition: Case was staffed with Reita Cliche DNP who recommended patient be re-evaluated in the a.m.     Disposition Initial Assessment Completed for this Encounter: Yes Disposition of Patient: Inpatient treatment program Type of inpatient treatment program: Adult  On Site Evaluation by:   Reviewed with Physician:    Mamie Nick 02/13/2017 11:34 AM

## 2017-02-13 NOTE — ED Notes (Signed)
hemmocult card was negative for blood; patient sts she has a follow up appt with her doctor in [place] on august 28th

## 2017-02-13 NOTE — ED Notes (Signed)
Pt reports anxiety and nausea. I stated that we could remedicate her at 1900. Pt verballizes understanding and given ginger ale.

## 2017-02-13 NOTE — ED Notes (Signed)
Received call from [facility], Lab, pt's ETOH is 368.  Vernie Shanks, PA-C informed.

## 2017-02-13 NOTE — ED Provider Notes (Signed)
5:47 AM Patient signed out to me at shift change. Patient with recent diagnosis of gastric cancer, involuntary committed for suicidal thoughts. Patient very intoxicated upon arrival, her alcohol level is 368. Poor historian. Signed out to me pending sobering so that she can be assessed by TTS.  Patient is now awake, alert, oriented. Answering all questions appropriately. She states she is very nauseated. She received Zofran and Ativan earlier. I will provide her with some more antibiotics and placed TTS consult. Patient was found to be anemic on labs today, question whether this is from her gastric cancer. Will recheck her hemoglobin this morning to make sure he is not dropping rapidly.  Vitals:   02/12/17 2222 02/12/17 2223 02/13/17 0033 02/13/17 0500  BP: 111/77  122/81 134/84  Pulse: (!) 110  (!) 106 (!) 107  Resp: 17  20 18   Temp: 99 F (37.2 C)  98.6 F (37 C) 97.7 F (36.5 C)  TempSrc: Oral  Oral Oral  SpO2: 97%  96% 98%  Weight:  72.2 kg (159 lb 1 oz)    Height:  5\' 4"  (1.626 m)        Jeannett Senior, PA-C 02/13/17 2255    Ripley Fraise, MD 02/13/17 [phone]

## 2017-02-13 NOTE — ED Notes (Signed)
Patient transported to CT 

## 2017-02-13 NOTE — ED Notes (Signed)
Bed: [facility] Expected date:  Expected time:  Means of arrival:  Comments: Hold for rom 26

## 2017-02-13 NOTE — ED Notes (Signed)
SBAR Report received from previous nurse. Pt received calm and visible on unit. Pt denies current SI/ HI, A/V H, depression, anxiety, but rates pain 5/10 at this time, and appears otherwise stable and free of distress. Pt asking for ativan and pain medication once received to the unit. Pt reminded of camera surveillance, q 15 min rounds, and rules of the milieu. Will continue to assess.

## 2017-02-13 NOTE — ED Notes (Signed)
Pt c/o nausea.  

## 2017-02-13 NOTE — Progress Notes (Signed)
Orthopedic Tech Progress Note Patient Details:  Shannon Cervantes 27-Oct-1970 [id number]  Went to apply post op shoe and ace bandage to Rt foot. Pt was already wearing a aircast on her Rt ankle. Ortho tech took off air cast and applied ace bandage, then aircast, followed by the post op shoe to Rt ankle/foot. When applying ortho device Pt mentioned that she thinks she might have broken her Lt toes as well. Informed TCU RN about Lt foot. Ortho Devices Type of Ortho Device: Ace wrap, Postop shoe/boot Ortho Device/Splint Location: Ace bandage applied under aircast and post OP shoe to Rt foot. Ortho Device/Splint Interventions: Application   Shannon Cervantes 02/13/2017, 1:10 PM

## 2017-02-13 NOTE — Progress Notes (Signed)
PHARMACIST - PHYSICIAN COMMUNICATION  DR: Kenton Kingfisher  CONCERNING: IV to Oral Route Change Policy  RECOMMENDATION: This patient is receiving thiamine by the intravenous route.  Based on criteria approved by the Pharmacy and Therapeutics Committee, the intravenous medication(s) is/are being converted to the equivalent oral dose form(s).        DESCRIPTION: These criteria include:  The patient is eating (either orally or via tube) and/or has been taking other orally administered medications for a least 24 hours  The patient has no evidence of active gastrointestinal bleeding or impaired GI absorption (gastrectomy, short bowel, patient on TNA or NPO).  If you have questions about this conversion, please contact the Pharmacy Department  []   [phone] )  [facility] []   [phone] )  [facility] []   [phone] )  [facility] []   [phone] )  [facility] [x]   [phone] )  [place], [place] 02/13/2017 12:30 PM

## 2017-02-13 NOTE — ED Notes (Signed)
Pt provided pillow for elevation or RLE.

## 2017-02-13 NOTE — ED Notes (Signed)
Pt refused to changed into scrubs stating "I'm not suicidal.  I've been here before for that and I'm not suicidal."  Pt presents with slurred speech; pt admits to ETOH.

## 2017-02-13 NOTE — BH Assessment (Addendum)
[facility] Assessment Progress Note   Case was staffed with Lord DNP who recommended patient be re-evaluated in the a.m.    

## 2017-02-14 ENCOUNTER — Encounter (HOSPITAL_COMMUNITY): Payer: Self-pay

## 2017-02-14 ENCOUNTER — Inpatient Hospital Stay (HOSPITAL_COMMUNITY)
Admission: AD | Admit: 2017-02-14 | Discharge: 2017-02-25 | DRG: 885 | Disposition: A | Discharge status: Home / Self Care | Payer: Federal, State, Local not specified - Other | Attending: Psychiatry | Admitting: Psychiatry

## 2017-02-14 DIAGNOSIS — Z791 Long term (current) use of non-steroidal anti-inflammatories (NSAID): Secondary | ICD-10-CM

## 2017-02-14 DIAGNOSIS — R1013 Epigastric pain: Secondary | ICD-10-CM | POA: Diagnosis present

## 2017-02-14 DIAGNOSIS — Y908 Blood alcohol level of 240 mg/100 ml or more: Secondary | ICD-10-CM | POA: Diagnosis present

## 2017-02-14 DIAGNOSIS — R45851 Suicidal ideations: Secondary | ICD-10-CM | POA: Diagnosis present

## 2017-02-14 DIAGNOSIS — K219 Gastro-esophageal reflux disease without esophagitis: Secondary | ICD-10-CM | POA: Diagnosis present

## 2017-02-14 DIAGNOSIS — Z87891 Personal history of nicotine dependence: Secondary | ICD-10-CM

## 2017-02-14 DIAGNOSIS — G479 Sleep disorder, unspecified: Secondary | ICD-10-CM

## 2017-02-14 DIAGNOSIS — F419 Anxiety disorder, unspecified: Secondary | ICD-10-CM | POA: Diagnosis present

## 2017-02-14 DIAGNOSIS — E785 Hyperlipidemia, unspecified: Secondary | ICD-10-CM | POA: Diagnosis present

## 2017-02-14 DIAGNOSIS — D649 Anemia, unspecified: Secondary | ICD-10-CM | POA: Diagnosis not present

## 2017-02-14 DIAGNOSIS — S40021A Contusion of right upper arm, initial encounter: Secondary | ICD-10-CM | POA: Diagnosis present

## 2017-02-14 DIAGNOSIS — S300XXA Contusion of lower back and pelvis, initial encounter: Secondary | ICD-10-CM | POA: Diagnosis present

## 2017-02-14 DIAGNOSIS — F1020 Alcohol dependence, uncomplicated: Secondary | ICD-10-CM

## 2017-02-14 DIAGNOSIS — S0083XA Contusion of other part of head, initial encounter: Secondary | ICD-10-CM | POA: Diagnosis present

## 2017-02-14 DIAGNOSIS — Z888 Allergy status to other drugs, medicaments and biological substances status: Secondary | ICD-10-CM

## 2017-02-14 DIAGNOSIS — F332 Major depressive disorder, recurrent severe without psychotic features: Principal | ICD-10-CM | POA: Diagnosis present

## 2017-02-14 DIAGNOSIS — Z79899 Other long term (current) drug therapy: Secondary | ICD-10-CM

## 2017-02-14 DIAGNOSIS — Z818 Family history of other mental and behavioral disorders: Secondary | ICD-10-CM

## 2017-02-14 DIAGNOSIS — G8929 Other chronic pain: Secondary | ICD-10-CM | POA: Diagnosis present

## 2017-02-14 DIAGNOSIS — I119 Hypertensive heart disease without heart failure: Secondary | ICD-10-CM | POA: Diagnosis present

## 2017-02-14 DIAGNOSIS — Z811 Family history of alcohol abuse and dependence: Secondary | ICD-10-CM | POA: Diagnosis not present

## 2017-02-14 DIAGNOSIS — D469 Myelodysplastic syndrome, unspecified: Secondary | ICD-10-CM | POA: Diagnosis not present

## 2017-02-14 DIAGNOSIS — S40022A Contusion of left upper arm, initial encounter: Secondary | ICD-10-CM | POA: Diagnosis present

## 2017-02-14 DIAGNOSIS — K921 Melena: Secondary | ICD-10-CM | POA: Diagnosis present

## 2017-02-14 DIAGNOSIS — C169 Malignant neoplasm of stomach, unspecified: Secondary | ICD-10-CM | POA: Diagnosis present

## 2017-02-14 DIAGNOSIS — F39 Unspecified mood [affective] disorder: Secondary | ICD-10-CM | POA: Diagnosis not present

## 2017-02-14 DIAGNOSIS — R296 Repeated falls: Secondary | ICD-10-CM | POA: Diagnosis present

## 2017-02-14 DIAGNOSIS — Z9884 Bariatric surgery status: Secondary | ICD-10-CM

## 2017-02-14 DIAGNOSIS — D500 Iron deficiency anemia secondary to blood loss (chronic): Secondary | ICD-10-CM | POA: Diagnosis present

## 2017-02-14 DIAGNOSIS — I43 Cardiomyopathy in diseases classified elsewhere: Secondary | ICD-10-CM | POA: Diagnosis present

## 2017-02-14 DIAGNOSIS — S92911A Unspecified fracture of right toe(s), initial encounter for closed fracture: Secondary | ICD-10-CM | POA: Diagnosis present

## 2017-02-14 DIAGNOSIS — F4310 Post-traumatic stress disorder, unspecified: Secondary | ICD-10-CM | POA: Diagnosis present

## 2017-02-14 DIAGNOSIS — Z8249 Family history of ischemic heart disease and other diseases of the circulatory system: Secondary | ICD-10-CM

## 2017-02-14 DIAGNOSIS — G4700 Insomnia, unspecified: Secondary | ICD-10-CM | POA: Diagnosis present

## 2017-02-14 MED ORDER — HYDROXYZINE HCL 25 MG PO TABS: 25.0000 mg | ORAL_TABLET | Freq: Four times a day (QID) | ORAL | Status: DC | PRN

## 2017-02-14 MED ORDER — VITAMIN B-1 100 MG PO TABS
100.0000 mg | ORAL_TABLET | Freq: Every day | ORAL | Status: DC
  Administered 2017-02-15 – 2017-02-25 (×11): 100 mg via ORAL
  Filled 2017-02-14 (×13): qty 1

## 2017-02-14 MED ORDER — IBUPROFEN 400 MG PO TABS
400.0000 mg | ORAL_TABLET | Freq: Four times a day (QID) | ORAL | Status: DC | PRN
  Administered 2017-02-14 – 2017-02-16 (×5): 400 mg via ORAL
  Filled 2017-02-14 (×5): qty 1

## 2017-02-14 MED ORDER — ACETAMINOPHEN 325 MG PO TABS
650.0000 mg | ORAL_TABLET | Freq: Four times a day (QID) | ORAL | Status: DC | PRN
  Administered 2017-02-18 – 2017-02-25 (×11): 650 mg via ORAL
  Filled 2017-02-14 (×11): qty 2

## 2017-02-14 MED ORDER — ADULT MULTIVITAMIN W/MINERALS CH
1.0000 | ORAL_TABLET | Freq: Every day | ORAL | Status: DC
  Administered 2017-02-14: 1 via ORAL
  Filled 2017-02-14: qty 1

## 2017-02-14 MED ORDER — LORAZEPAM 2 MG/ML IJ SOLN: 0.0000 mg | Freq: Two times a day (BID) | INTRAMUSCULAR | Status: DC

## 2017-02-14 MED ORDER — ADULT MULTIVITAMIN W/MINERALS CH
1.0000 | ORAL_TABLET | Freq: Every day | ORAL | Status: DC
  Administered 2017-02-15 – 2017-02-25 (×11): 1 via ORAL
  Filled 2017-02-14 (×13): qty 1

## 2017-02-14 MED ORDER — LORAZEPAM 1 MG PO TABS: 1.0000 mg | ORAL_TABLET | Freq: Four times a day (QID) | ORAL | Status: DC | PRN

## 2017-02-14 MED ORDER — LOPERAMIDE HCL 2 MG PO CAPS: 2.0000 mg | ORAL_CAPSULE | ORAL | Status: DC | PRN

## 2017-02-14 MED ORDER — ONDANSETRON 4 MG PO TBDP
ORAL_TABLET | ORAL | Status: AC
  Filled 2017-02-14: qty 2

## 2017-02-14 MED ORDER — LORAZEPAM 1 MG PO TABS
1.0000 mg | ORAL_TABLET | Freq: Two times a day (BID) | ORAL | Status: AC
  Administered 2017-02-17 – 2017-02-18 (×2): 1 mg via ORAL
  Filled 2017-02-14 (×2): qty 1

## 2017-02-14 MED ORDER — ADULT MULTIVITAMIN W/MINERALS CH: 1.0000 | ORAL_TABLET | Freq: Every day | ORAL | Status: DC

## 2017-02-14 MED ORDER — LORAZEPAM 2 MG/ML IJ SOLN: 1.0000 mg | Freq: Four times a day (QID) | INTRAMUSCULAR | Status: DC | PRN

## 2017-02-14 MED ORDER — VITAMIN B-1 100 MG PO TABS
100.0000 mg | ORAL_TABLET | Freq: Every day | ORAL | Status: DC
  Filled 2017-02-14: qty 1

## 2017-02-14 MED ORDER — LOPERAMIDE HCL 2 MG PO CAPS: 2.0000 mg | ORAL_CAPSULE | ORAL | Status: AC | PRN

## 2017-02-14 MED ORDER — ALUM & MAG HYDROXIDE-SIMETH 200-200-20 MG/5ML PO SUSP
30.0000 mL | ORAL | Status: DC | PRN
  Administered 2017-02-23: 30 mL via ORAL
  Filled 2017-02-14: qty 30

## 2017-02-14 MED ORDER — ONDANSETRON HCL 4 MG PO TABS
8.0000 mg | ORAL_TABLET | Freq: Three times a day (TID) | ORAL | Status: DC | PRN
  Administered 2017-02-14 – 2017-02-22 (×12): 8 mg via ORAL
  Filled 2017-02-14 (×2): qty 2
  Filled 2017-02-14: qty 4
  Filled 2017-02-14 (×7): qty 2

## 2017-02-14 MED ORDER — LORAZEPAM 1 MG PO TABS
1.0000 mg | ORAL_TABLET | Freq: Every day | ORAL | Status: AC
  Administered 2017-02-19: 1 mg via ORAL
  Filled 2017-02-14: qty 1

## 2017-02-14 MED ORDER — FOLIC ACID 1 MG PO TABS
1.0000 mg | ORAL_TABLET | Freq: Every day | ORAL | Status: DC
  Administered 2017-02-14: 1 mg via ORAL
  Filled 2017-02-14: qty 1

## 2017-02-14 MED ORDER — TRAZODONE HCL 50 MG PO TABS
ORAL_TABLET | ORAL | Status: AC
  Filled 2017-02-14: qty 1

## 2017-02-14 MED ORDER — LORAZEPAM 1 MG PO TABS
0.0000 mg | ORAL_TABLET | Freq: Two times a day (BID) | ORAL | Status: DC
Start: 2017-02-14 — End: 2017-02-14
  Administered 2017-02-14: 1 mg via ORAL
  Filled 2017-02-14: qty 1

## 2017-02-14 MED ORDER — LORAZEPAM 1 MG PO TABS
1.0000 mg | ORAL_TABLET | Freq: Four times a day (QID) | ORAL | Status: AC
  Administered 2017-02-14 – 2017-02-16 (×6): 1 mg via ORAL
  Filled 2017-02-14 (×6): qty 1

## 2017-02-14 MED ORDER — LORAZEPAM 1 MG PO TABS
1.0000 mg | ORAL_TABLET | Freq: Four times a day (QID) | ORAL | Status: AC | PRN
  Filled 2017-02-14: qty 1

## 2017-02-14 MED ORDER — LORAZEPAM 1 MG PO TABS
1.0000 mg | ORAL_TABLET | Freq: Three times a day (TID) | ORAL | Status: AC
  Administered 2017-02-16 – 2017-02-17 (×3): 1 mg via ORAL
  Filled 2017-02-14 (×2): qty 1

## 2017-02-14 MED ORDER — LORAZEPAM 1 MG PO TABS
0.0000 mg | ORAL_TABLET | Freq: Four times a day (QID) | ORAL | Status: DC
Start: 2017-02-14 — End: 2017-02-14
  Administered 2017-02-14: 1 mg via ORAL
  Filled 2017-02-14: qty 1

## 2017-02-14 MED ORDER — WHITE PETROLATUM GEL: Status: DC | PRN

## 2017-02-14 MED ORDER — LORAZEPAM 1 MG PO TABS: 0.0000 mg | ORAL_TABLET | Freq: Two times a day (BID) | ORAL | Status: DC

## 2017-02-14 MED ORDER — MAGNESIUM HYDROXIDE 400 MG/5ML PO SUSP: 30.0000 mL | Freq: Every day | ORAL | Status: DC | PRN

## 2017-02-14 MED ORDER — NICOTINE 21 MG/24HR TD PT24: 21.0000 mg | MEDICATED_PATCH | Freq: Every day | TRANSDERMAL | Status: DC

## 2017-02-14 MED ORDER — THIAMINE HCL 100 MG/ML IJ SOLN: 100.0000 mg | Freq: Every day | INTRAMUSCULAR | Status: DC

## 2017-02-14 MED ORDER — ONDANSETRON 4 MG PO TBDP: 4.0000 mg | ORAL_TABLET | Freq: Four times a day (QID) | ORAL | Status: DC | PRN

## 2017-02-14 MED ORDER — PANTOPRAZOLE SODIUM 40 MG PO TBEC
40.0000 mg | DELAYED_RELEASE_TABLET | Freq: Every day | ORAL | Status: DC
  Administered 2017-02-15 – 2017-02-25 (×11): 40 mg via ORAL
  Filled 2017-02-14 (×2): qty 1
  Filled 2017-02-14 (×3): qty 7
  Filled 2017-02-14 (×2): qty 1
  Filled 2017-02-14 (×2): qty 7
  Filled 2017-02-14: qty 1
  Filled 2017-02-14 (×4): qty 7

## 2017-02-14 MED ORDER — HYDROXYZINE HCL 25 MG PO TABS
25.0000 mg | ORAL_TABLET | Freq: Four times a day (QID) | ORAL | Status: AC | PRN
  Administered 2017-02-14 – 2017-02-17 (×4): 25 mg via ORAL
  Filled 2017-02-14 (×4): qty 1

## 2017-02-14 MED ORDER — VITAMIN B-1 100 MG PO TABS: 100.0000 mg | ORAL_TABLET | Freq: Every day | ORAL | Status: DC

## 2017-02-14 MED ORDER — TRAZODONE 25 MG HALF TABLET
25.0000 mg | ORAL_TABLET | Freq: Every day | ORAL | Status: DC
  Administered 2017-02-14: 25 mg via ORAL
  Filled 2017-02-14 (×3): qty 1

## 2017-02-14 NOTE — Progress Notes (Signed)
D   Pt endorses depression anxiety and sleep disturbance    She was also complaining of foot pain and lip discomfort where she had fallen while intoxicated and busted the lip    She is polite and expressed thanks for the help she received since she has been here    She did not attend group and has interacted minimally with others  A   Verbal support given   Medications administered and effectiveness monitored   Q 15 min checks  R   Pt is safe at present time and receptive to verbal support

## 2017-02-14 NOTE — Tx Team (Signed)
Initial Treatment Plan 02/14/2017 6:17 PM Shannon Cervantes [facility]:[id number]    PATIENT STRESSORS: Financial difficulties Health problems Substance abuse   PATIENT STRENGTHS: Capable of independent living Communication skills General fund of knowledge Motivation for treatment/growth   PATIENT IDENTIFIED PROBLEMS: Depression  Substance abuse  Suicidal ideation  "Get better, go home (to [place]) to get my things and move back to [place]"  "Get back on the road to sobriety"             DISCHARGE CRITERIA:  Improved stabilization in mood, thinking, and/or behavior Verbal commitment to aftercare and medication compliance Withdrawal symptoms are absent or subacute and managed without 24-hour nursing intervention  PRELIMINARY DISCHARGE PLAN: Attend 12-step recovery group Outpatient therapy Medication management  PATIENT/FAMILY INVOLVEMENT: This treatment plan has been presented to and reviewed with the patient, Shannon Cervantes.  The patient and family have been given the opportunity to ask questions and make suggestions.  Windell Moment, RN 02/14/2017, 6:17 PM

## 2017-02-14 NOTE — BH Assessment (Signed)
[place] Assessment Progress Note Patient has been accepted to [facility]-2 at 1400 hrs. Patient's paperwork has been faxed to facility.

## 2017-02-14 NOTE — ED Notes (Signed)
Requested and received ativan for withdrawal symptoms.

## 2017-02-14 NOTE — Consult Note (Signed)
[facility] Face-to-Face Psychiatry Consult   Reason for Consult:  Alcohol dependence with suicide thret Referring Physician:  EDP Patient Identification: Shannon Cervantes MRN:  [id number] Principal Diagnosis: Major depressive disorder, recurrent severe without psychotic features [facility]) Diagnosis:   Patient Active Problem List   Diagnosis Date Noted  . Major depressive disorder, recurrent severe without psychotic features ([place]) [F33.2] 11/13/2014    Priority: High  . Alcohol use disorder, severe, dependence ([place]) [F10.20] 11/12/2014    Priority: High  . Suicidal ideation [R45.851] 11/12/2014    Priority: High  . Viral gastroenteritis [A08.4] 01/11/2015  . Needle stick injury of finger [S61.239A, W27.3XXA] 12/11/2014  . Essential hypertension [I10] 12/11/2014  . Cough [R05] 12/11/2014  . PTSD (post-traumatic stress disorder) [F43.10] 11/13/2014  . 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 Chest pain [R07.9] 11/02/2013  . Obesity [E66.9] 08/21/2011  . Hypertensive cardiomyopathy ([place]) [I11.9, Y86] 08/20/2011    Total Time spent with patient: 45 minutes  Subjective:   Shannon Cervantes is a 46 y.o. female patient admitted with suicide threat.  HPI:  45 yo female who was sent to the ED by her family with alcohol dependence and suicide threats.  She recently received a diagnosis of stomach cancer (noted as Stage IV) after unexplained broken bones.  Today on assessment, she minimizes this diagnosis as they have not diagnosed her but family reports differ, denial is definitely present.  She has been drinking a pint of liquor daily for the past "week or so" after a brief sobriety--detox in [place] a month or so ago.  Her family went to [place] and brought her here to care for her, reports she plans to drink herself to death.  Some cognitive slowing, decreased energy, hopeless at this time.    Past Psychiatric History: depression  Risk to Self: Suicidal Ideation: Yes-Currently Present  (Passive drinking herself to death) Suicidal Intent: No Is patient at risk for suicide?: Yes Suicidal Plan?: No Access to Means: Yes Specify Access to Suicidal Means: Excessive alcohol use What has been your use of drugs/alcohol within the last 12 months?: Current use How many times?: 1 (per history review) Other Self Harm Risks: NA Triggers for Past Attempts: Unknown Intentional Self Injurious Behavior: None Risk to Others: Homicidal Ideation: No Thoughts of Harm to Others: No Current Homicidal Intent: No Current Homicidal Plan: No Access to Homicidal Means: No Identified Victim: NA History of harm to others?: No Assessment of Violence: None Noted Violent Behavior Description: NA Does patient have access to weapons?: No Criminal Charges Pending?: No Does patient have a court date: No Prior Inpatient Therapy: Prior Inpatient Therapy: Yes Prior Therapy Dates: 2016 Prior Therapy Facilty/Provider(s): [facility] Reason for Treatment: MH, SA issues Prior Outpatient Therapy: Prior Outpatient Therapy: Yes Prior Therapy Dates: 2016 (Per note review) Prior Therapy Facilty/Provider(s): [facility] Reason for Treatment: Med mang Does patient have an ACCT team?: No Does patient have Intensive In-House Services?  : No Does patient have [facility] services? : No Does patient have P4CC services?: No  Past Medical History:  Past Medical History:  Diagnosis Date  . Alcohol abuse   . Anxiety   . Depression   . Foot fracture 01-07-2011  . Hyperlipidemia   . Hypertensive cardiomyopathy ([place])     Past Surgical History:  Procedure Laterality Date  . CESAREAN SECTION    . GASTRIC BYPASS     Family History:  Family History  Problem Relation Age of Onset  . Hypertension Mother   .  Heart disease Mother   . Hyperlipidemia Mother   . Heart disease Father   . Alcohol abuse Brother   . Alcohol abuse Paternal Uncle    Family Psychiatric  History: unknown Social History:  History  Alcohol  Use  . Yes    Comment: daily use-1 1/2 pints cognac     History  Drug Use No    Social History   Social History  . Marital status: Divorced    Spouse name: N/A  . Number of children: 4  . Years of education: 14   Occupational History  . RN    Social History Main Topics  . Smoking status: Former Smoker    Packs/day: 0.01    Types: Cigarettes  . Smokeless tobacco: Never Used     Comment: 1 Cigarettes every other Day  . Alcohol use Yes     Comment: daily use-1 1/2 pints cognac  . Drug use: No  . Sexual activity: No   Other Topics Concern  . Not on file   Social History Narrative   Fun: Travel and shopping   Denies religious beliefs effecting healthcare.    Additional Social History:    Allergies:   Allergies  Allergen Reactions  . Suboxone [Buprenorphine Hcl-Naloxone Hcl] Shortness Of Breath    dizziness     Labs:  Results for orders placed or performed during the hospital encounter of 02/12/17 (from the past 48 hour(s))  Comprehensive metabolic panel     Status: Abnormal   Collection Time: 02/12/17 10:55 PM  Result Value Ref Range   Sodium 141 135 - 145 mmol/L   Potassium 3.1 (L) 3.5 - 5.1 mmol/L   Chloride 108 101 - 111 mmol/L   CO2 22 22 - 32 mmol/L   Glucose, Bld 101 (H) 65 - 99 mg/dL   BUN 7 6 - 20 mg/dL   Creatinine, Ser 0.57 0.44 - 1.00 mg/dL   Calcium 7.9 (L) 8.9 - 10.3 mg/dL   Total Protein 6.7 6.5 - 8.1 g/dL   Albumin 3.6 3.5 - 5.0 g/dL   AST 92 (H) 15 - 41 U/L   ALT 39 14 - 54 U/L   Alkaline Phosphatase 97 38 - 126 U/L   Total Bilirubin 0.5 0.3 - 1.2 mg/dL   GFR calc non Af Amer >60 >60 mL/min   GFR calc Af Amer >60 >60 mL/min    Comment: (NOTE) The eGFR has been calculated using the CKD EPI equation. This calculation has not been validated in all clinical situations. eGFR's persistently <60 mL/min signify possible Chronic Kidney Disease.    Anion gap 11 5 - 15  Ethanol     Status: Abnormal   Collection Time: 02/12/17 10:55 PM   Result Value Ref Range   Alcohol, Ethyl (B) 368 (HH) <5 mg/dL    Comment:        LOWEST DETECTABLE LIMIT FOR SERUM ALCOHOL IS 5 mg/dL FOR MEDICAL PURPOSES ONLY CRITICAL RESULT CALLED TO, READ BACK BY AND VERIFIED WITH: Lisa Roca [id number] @ 0007 BY J SCOTTON   Salicylate level     Status: None   Collection Time: 02/12/17 10:55 PM  Result Value Ref Range   Salicylate Lvl <5.9 2.8 - 30.0 mg/dL  Acetaminophen level     Status: None   Collection Time: 02/12/17 10:55 PM  Result Value Ref Range   Acetaminophen (Tylenol), Serum 16 10 - 30 ug/mL    Comment:        THERAPEUTIC CONCENTRATIONS VARY  SIGNIFICANTLY. A RANGE OF 10-30 ug/mL MAY BE AN EFFECTIVE CONCENTRATION FOR MANY PATIENTS. HOWEVER, SOME ARE BEST TREATED AT CONCENTRATIONS OUTSIDE THIS RANGE. ACETAMINOPHEN CONCENTRATIONS >150 ug/mL AT 4 HOURS AFTER INGESTION AND >50 ug/mL AT 12 HOURS AFTER INGESTION ARE OFTEN ASSOCIATED WITH TOXIC REACTIONS.   cbc     Status: Abnormal   Collection Time: 02/12/17 10:55 PM  Result Value Ref Range   WBC 6.0 4.0 - 10.5 K/uL   RBC 3.48 (L) 3.87 - 5.11 MIL/uL   Hemoglobin 8.8 (L) 12.0 - 15.0 g/dL   HCT 27.5 (L) 36.0 - 46.0 %   MCV 79.0 78.0 - 100.0 fL   MCH 25.3 (L) 26.0 - 34.0 pg   MCHC 32.0 30.0 - 36.0 g/dL   RDW 16.5 (H) 11.5 - 15.5 %   Platelets 426 (H) 150 - 400 K/uL  Rapid urine drug screen (hospital performed)     Status: Abnormal   Collection Time: 02/13/17  2:54 AM  Result Value Ref Range   Opiates NONE DETECTED NONE DETECTED   Cocaine NONE DETECTED NONE DETECTED   Benzodiazepines POSITIVE (A) NONE DETECTED   Amphetamines NONE DETECTED NONE DETECTED   Tetrahydrocannabinol NONE DETECTED NONE DETECTED   Barbiturates POSITIVE (A) NONE DETECTED    Comment:        DRUG SCREEN FOR MEDICAL PURPOSES ONLY.  IF CONFIRMATION IS NEEDED FOR ANY PURPOSE, NOTIFY LAB WITHIN 5 DAYS.        LOWEST DETECTABLE LIMITS FOR URINE DRUG SCREEN Drug Class       Cutoff  (ng/mL) Amphetamine      1000 Barbiturate      200 Benzodiazepine   591 Tricyclics       638 Opiates          300 Cocaine          300 THC              50   Pregnancy, urine     Status: None   Collection Time: 02/13/17  2:54 AM  Result Value Ref Range   Preg Test, Ur NEGATIVE NEGATIVE    Comment:        THE SENSITIVITY OF THIS METHODOLOGY IS >20 mIU/mL.   CBC with Differential     Status: Abnormal   Collection Time: 02/13/17  5:50 AM  Result Value Ref Range   WBC 6.2 4.0 - 10.5 K/uL   RBC 3.27 (L) 3.87 - 5.11 MIL/uL   Hemoglobin 8.4 (L) 12.0 - 15.0 g/dL   HCT 26.0 (L) 36.0 - 46.0 %   MCV 79.5 78.0 - 100.0 fL   MCH 25.7 (L) 26.0 - 34.0 pg   MCHC 32.3 30.0 - 36.0 g/dL   RDW 16.6 (H) 11.5 - 15.5 %   Platelets 363 150 - 400 K/uL   Neutrophils Relative % 65 %   Neutro Abs 4.0 1.7 - 7.7 K/uL   Lymphocytes Relative 26 %   Lymphs Abs 1.6 0.7 - 4.0 K/uL   Monocytes Relative 6 %   Monocytes Absolute 0.4 0.1 - 1.0 K/uL   Eosinophils Relative 1 %   Eosinophils Absolute 0.1 0.0 - 0.7 K/uL   Basophils Relative 2 %   Basophils Absolute 0.1 0.0 - 0.1 K/uL    Current Facility-Administered Medications  Medication Dose Route Frequency Provider Last Rate Last Dose  . acetaminophen (TYLENOL) tablet 650 mg  650 mg Oral Q6H PRN Tanna Furry, MD   650 mg at 02/14/17 1109  . hydrOXYzine (  ATARAX/VISTARIL) tablet 25 mg  25 mg Oral Q6H PRN [name], NP      . loperamide (IMODIUM) capsule 2-4 mg  2-4 mg Oral PRN [name], NP      . LORazepam (ATIVAN) injection 0-4 mg  0-4 mg Intravenous Q12H [name], NP       Or  . LORazepam (ATIVAN) tablet 0-4 mg  0-4 mg Oral Q12H [name], NP   1 mg at 02/14/17 1109  . LORazepam (ATIVAN) tablet 1 mg  1 mg Oral Q6H PRN [name], NP      . multivitamin with minerals tablet 1 tablet  1 tablet Oral Daily Lord, Jamison Y, NP      . nicotine (NICODERM CQ - dosed in mg/24 hours) patch 21 mg  21 mg Transdermal Daily Harris, Abigail,  PA-C      . ondansetron (ZOFRAN) tablet 8 mg  8 mg Oral Q8H PRN Lacretia Leigh, MD   8 mg at 02/14/17 1040  . ondansetron (ZOFRAN-ODT) disintegrating tablet 4 mg  4 mg Oral Q6H PRN [name], NP      . pantoprazole (PROTONIX) EC tablet 40 mg  40 mg Oral Daily Square Jowett, MD   40 mg at 02/14/17 1040  . [START ON 02/15/2017] thiamine (VITAMIN B-1) tablet 100 mg  100 mg Oral Daily [name], NP      . traZODone (DESYREL) tablet 25 mg  25 mg Oral QHS Dacota Ruben, MD      . white petrolatum (VASELINE) gel   Topical PRN Lacretia Leigh, MD       Current Outpatient Prescriptions  Medication Sig Dispense Refill  . acetaminophen (TYLENOL) 500 MG tablet Take 1,000 mg by mouth every 6 (six) hours as needed for moderate pain.    Marland Kitchen buPROPion (WELLBUTRIN XL) 150 MG 24 hr tablet Take 150 mg by mouth daily.    Marland Kitchen ibuprofen (ADVIL,MOTRIN) 200 MG tablet Take 800 mg by mouth every 6 (six) hours as needed for fever, headache, mild pain, moderate pain or cramping.     Marland Kitchen buPROPion (WELLBUTRIN XL) 300 MG 24 hr tablet Take 1 tablet (300 mg total) by mouth daily. (Patient not taking: Reported on 06/23/2015) 30 tablet 0  . chlordiazePOXIDE (LIBRIUM) 25 MG capsule 20m PO TID x 1D, then 25-547mPO BID X 1D, then 25-5074mO QD X 1D (Patient not taking: Reported on 02/13/2017) 10 capsule 0  . lisinopril (PRINIVIL,ZESTRIL) 5 MG tablet Take 1 tablet (5 mg total) by mouth daily. (Patient not taking: Reported on 02/13/2017) 30 tablet 0  . pantoprazole (PROTONIX) 40 MG tablet Take 1 tablet (40 mg total) by mouth daily. (Patient not taking: Reported on 02/13/2017) 30 tablet 1  . potassium chloride SA (K-DUR,KLOR-CON) 20 MEQ tablet Take 1 tablet (20 mEq total) by mouth daily. (Patient not taking: Reported on 06/23/2015) 5 tablet 0    Musculoskeletal: Strength & Muscle Tone: within normal limits Gait & Station: normal Patient leans: N/A  Psychiatric Specialty Exam: Physical Exam  Constitutional: She is  oriented to person, place, and time. She appears well-developed and well-nourished.  HENT:  Head: Normocephalic.  Neck: Normal range of motion.  Respiratory: Effort normal.  Musculoskeletal: Normal range of motion.  Neurological: She is alert and oriented to person, place, and time.  Psychiatric: Her speech is normal and behavior is normal. Judgment normal. Cognition and memory are impaired. She exhibits a depressed mood. She expresses suicidal ideation. She expresses  suicidal plans.    Review of Systems  Psychiatric/Behavioral: Positive for depression, substance abuse and suicidal ideas.  All other systems reviewed and are negative.   Blood pressure 127/84, pulse 88, temperature 99 F (37.2 C), temperature source Oral, resp. rate 20, height '5\' 4"'  (1.626 m), weight 72.2 kg (159 lb 1 oz), SpO2 99 %.Body mass index is 27.3 kg/m.  General Appearance: Disheveled  Eye Contact:  Fair  Speech:  Clear and Coherent  Volume:  Normal  Mood:  Depressed  Affect:  Congruent  Thought Process:  Coherent and Descriptions of Associations: Intact  Orientation:  Full (Time, Place, and Person)  Thought Content:  Rumination  Suicidal Thoughts:  Yes.  with intent/plan  Homicidal Thoughts:  No  Memory:  Immediate;   Fair Recent;   Fair Remote;   Fair  Judgement:  Fair  Insight:  Fair  Psychomotor Activity:  Decreased  Concentration:  Concentration: Fair and Attention Span: Fair  Recall:  [employer] of Knowledge:  Fair  Language:  Good  Akathisia:  No  Handed:  Right  AIMS (if indicated):     Assets:  Housing Leisure Time Physical Health Resilience Social Support  ADL's:  Intact  Cognition:  Impaired,  Mild  Sleep:        Treatment Plan Summary: Daily contact with patient to assess and evaluate symptoms and progress in treatment, Medication management and Plan major depressive disorder, recurrent, severe without psychosis:  -Crisis stabilization -Medication management:  Ativan alcohol  detox protocol started along with Trazodone 25 mg at bedtime for sleep -Individual and substance abuse counseling -[facility] to [facility]  Disposition: Recommend psychiatric Inpatient admission when medically cleared.  Waylan Boga, NP 02/14/2017 1:04 PM  Patient seen face-to-face for psychiatric evaluation, chart reviewed and case discussed with the physician extender and developed treatment plan. Reviewed the information documented and agree with the treatment plan. Corena Pilgrim, MD

## 2017-02-14 NOTE — Progress Notes (Signed)
Shannon Cervantes is a 46 year old female being admitted involuntarily to [facility] from [facility].  She came to the ED under IVC for suicidal ideation and drinking alcohol.  Her BAL on admission was 368.  She reported multiple medical issues and implied that she wouldn't be here long.  She has been drinking 1-2 pints of liquor daily.  She has history of A/V hallucinations when withdrawing from alcohol as well as nausea, vomiting and diarrhea.  She has a medical history of hypertensive cardiomyopathy, high cholesterol and broken right foot.  She is diagnosed with Major Depressive Disorder and Alcohol abuse severe.  She denies any SI/HI or A/V hallucinations.  She did report that she has a history of seizures and hallucinations when coming off alcohol.  She rated her pain as an 8/10 due to broken right foot.  Brace, ace wrap and boot intact.  She is able to ambulate without assistance. Oriented her to the unit.  Admission paperwork completed and signed.  Belongings searched and secured in locker # 11.  Skin assessment completed and noted multiple bruises on forehead, right buttocks, R/L thighs, R/L arms and right foot due to multiple falls.  Q 15 minute checks initiated for safety.  We will monitor the progress towards her goals.

## 2017-02-14 NOTE — ED Notes (Signed)
POC discussed. Awaiting [facility] transport.

## 2017-02-15 ENCOUNTER — Encounter (HOSPITAL_COMMUNITY): Payer: Self-pay | Admitting: Psychiatry

## 2017-02-15 DIAGNOSIS — F39 Unspecified mood [affective] disorder: Secondary | ICD-10-CM

## 2017-02-15 DIAGNOSIS — D649 Anemia, unspecified: Secondary | ICD-10-CM | POA: Clinically undetermined

## 2017-02-15 DIAGNOSIS — Z818 Family history of other mental and behavioral disorders: Secondary | ICD-10-CM

## 2017-02-15 DIAGNOSIS — Z811 Family history of alcohol abuse and dependence: Secondary | ICD-10-CM

## 2017-02-15 DIAGNOSIS — F1020 Alcohol dependence, uncomplicated: Secondary | ICD-10-CM | POA: Clinically undetermined

## 2017-02-15 DIAGNOSIS — C169 Malignant neoplasm of stomach, unspecified: Secondary | ICD-10-CM | POA: Diagnosis present

## 2017-02-15 DIAGNOSIS — F332 Major depressive disorder, recurrent severe without psychotic features: Principal | ICD-10-CM

## 2017-02-15 LAB — BASIC METABOLIC PANEL
Anion gap: 8 (ref 5–15)
BUN: 12 mg/dL (ref 6–20)
CALCIUM: 8.5 mg/dL — AB (ref 8.9–10.3)
CO2: 22 mmol/L (ref 22–32)
CREATININE: 0.62 mg/dL (ref 0.44–1.00)
Chloride: 107 mmol/L (ref 101–111)
GFR calc non Af Amer: >60 mL/min (ref >60)
Glucose, Bld: 122 mg/dL — ABNORMAL HIGH (ref 65–99)
Potassium: 3.5 mmol/L (ref 3.5–5.1)
SODIUM: 137 mmol/L (ref 135–145)

## 2017-02-15 LAB — CBC
HCT: 27.3 % — ABNORMAL LOW (ref 36.0–46.0)
Hemoglobin: 8.7 g/dL — ABNORMAL LOW (ref 12.0–15.0)
MCH: 26 pg (ref 26.0–34.0)
MCHC: 31.9 g/dL (ref 30.0–36.0)
MCV: 81.5 fL (ref 78.0–100.0)
Platelets: 419 10*3/uL — ABNORMAL HIGH (ref 150–400)
RBC: 3.35 MIL/uL — ABNORMAL LOW (ref 3.87–5.11)
RDW: 17 % — AB (ref 11.5–15.5)
WBC: 5.5 10*3/uL (ref 4.0–10.5)

## 2017-02-15 LAB — RETICULOCYTES
RBC.: 3.35 MIL/uL — AB (ref 3.87–5.11)
RETIC CT PCT: 3.7 % — AB (ref 0.4–3.1)
Retic Count, Absolute: 124 10*3/uL (ref 19.0–186.0)

## 2017-02-15 MED ORDER — BUPROPION HCL 75 MG PO TABS
75.0000 mg | ORAL_TABLET | Freq: Every day | ORAL | Status: DC
  Administered 2017-02-15 – 2017-02-18 (×4): 75 mg via ORAL
  Filled 2017-02-15: qty 1
  Filled 2017-02-15: qty 7
  Filled 2017-02-15 (×6): qty 1

## 2017-02-15 MED ORDER — ONDANSETRON 4 MG PO TBDP
ORAL_TABLET | ORAL | Status: AC
  Filled 2017-02-15: qty 2

## 2017-02-15 MED ORDER — QUETIAPINE FUMARATE 50 MG PO TABS
50.0000 mg | ORAL_TABLET | Freq: Every day | ORAL | Status: DC
  Administered 2017-02-15: 50 mg via ORAL
  Filled 2017-02-15 (×3): qty 1
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
## 2017-02-15 NOTE — BHH Group Notes (Addendum)
[place] LCSW Group Therapy Note   02/15/2017  10:15 - 11:15 AM   Type of Therapy and Topic: Group Therapy: Feelings Around Returning Home & Establishing a Framework Self Care   Participation Level: Did Not Attend as patient reports she is not feeling well at all   Description of Group:  Patients first processed thoughts and feelings about up coming discharge. These included fears of upcoming changes, lack of change, new living environments, judgements and expectations from others and overall stigma of MH issues. We discussed where to find supports and what self care may look and feel like.   Sheilah Pigeon, LCSW
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
## 2017-02-15 NOTE — [facility] Group Notes (Signed)
[place] Group Notes:  (Nursing/MHT/Case Management/Adjunct)  Date:  02/15/2017  Time:  1430  Type of Therapy:  Nurse Education - Healthy Support Systems  Participation Level:  Minimal  Participation Quality:  Minimal  Affect:  Blunted and Flat  Cognitive:  Alert  Insight:  Limited  Engagement in Group:  Limited  Modes of Intervention:  Education  Summary of Progress/Problems:  patient attended but left early to meet with Internist.    Jamie Kato 02/15/2017, 3:22 PM

## 2017-02-15 NOTE — Progress Notes (Addendum)
D: Patient observed initially up in the dayroom however has spent much of the day in bed. States she is feeling groggy and unsteady which she believes is in part due to the trazadone last night. "This has happened before. Even though it was a small dose, I feel wiped out."  Patient observed moving slowly, cautiously and gait unsteady at times. Patient also complaining of withdrawal - "I feel so jittery inside" - as well as nausea and sweats. Complaining of R foot pain this AM of a 10/10.  A: Medicated per orders, prn advil and zofran given for discomfort. Level III obs in place for safety. Emotional support offered and self inventory refused. Encouraged completion of Suicide Safety Plan and programming participation. Discussed POC with NP, SW.  Fall prevention plan in place and reviewed with patient as pt is a high fall risk due to weakness, unsteady gait and broken R foot. EKG completed per order and reviewed with T Money NP. Hat given to patient for stool sample and instructions provided.   R: Patient verbalizes understanding of POC, falls prevention education. Observed changing positions slowly as instructed. On reassess, patient was asleep. Patient denies SI/HI/AVH and remains safe on level III obs. Will continue to monitor closely and make verbal contact frequently.

## 2017-02-15 NOTE — Progress Notes (Signed)
D   Pt endorses depression anxiety and sleep disturbance    She was also complaining of foot pain and lip discomfort where she had fallen while intoxicated and busted the lip    She is polite and expressed thanks for the help she received since she has been here    She is socializing with select peers   She said today has not been a good day because she just feels like she has no energy and thinks it is from the trazadone which the doctor discontinued today A   Verbal support given   Medications administered and effectiveness monitored   Q 15 min checks  R   Pt is safe at present time and receptive to verbal support
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
## 2017-02-15 NOTE — Progress Notes (Signed)
Patient did attend the evening speaker AA meeting.  

## 2017-02-15 NOTE — Consult Note (Signed)
Medical Consultation   Shannon Cervantes  GYK:[id number]  DOB: 07/01/1970  DOA: 02/14/2017  PCP: [facility], FNP   Requesting physician: Dr. Parke Poisson  Reason for consultation: Melena, Anemia   History of Present Illness: Shannon Cervantes is an 46 y.o. female with a past medical history of alcoholism, hyperlipidemia, anxiety, depression, hypertensive cardiomyopathy, gastric bypass. Patient without history of varices. Patient is currently admitted for severe depression and alcohol detox. Patient has had melena for the past few months and was being worked up for GI etiology. She had an EGD two months ago that was not significant for any bleeding source. Follow-up colonoscopy was planned for 02/17/17. She came to [place] 6 days ago from [place] because her family felt she needed to be close to family secondary to her depression. Upon arrival to the ED, hemoglobin obtained in the emergency department was 8.8 and repeat was 8.4. Patient reports no recent melena or hematochezia. She has had melena over one week ago and has noticed red streaks on stool about two weeks ago. No hemoptysis.     Review of Systems:  Review of Systems  Constitutional: Negative for chills and fever.  Respiratory: Negative for cough, hemoptysis, sputum production, shortness of breath and wheezing.   Cardiovascular: Negative for chest pain, palpitations, orthopnea and leg swelling.  Gastrointestinal: Positive for abdominal pain (epigastric), blood in stool (2 weeks ago) and melena (chronic). Negative for constipation, diarrhea, nausea and vomiting.  Genitourinary: Negative for dysuria.  Neurological: Negative for dizziness and headaches.  Psychiatric/Behavioral: Positive for depression and substance abuse. Negative for hallucinations and suicidal ideas.  All other systems reviewed and are negative.   Past Medical History: Past Medical History:  Diagnosis Date  . Alcohol abuse   . Anxiety   .  Depression   . Foot fracture 01-07-2011  . Hyperlipidemia   . Hypertensive cardiomyopathy ([place])     Past Surgical History: Past Surgical History:  Procedure Laterality Date  . CESAREAN SECTION    . GASTRIC BYPASS       Allergies:   Allergies  Allergen Reactions  . Suboxone [Buprenorphine Hcl-Naloxone Hcl] Shortness Of Breath    dizziness      Social History:  reports that she has quit smoking. Her smoking use included Cigarettes. She smoked 0.01 packs per day. She has never used smokeless tobacco. She reports that she drinks alcohol. She reports that she does not use drugs.   Family History: Family History  Problem Relation Age of Onset  . Hypertension Mother   . Heart disease Mother   . Hyperlipidemia Mother   . Depression Mother   . Colon cancer Mother   . Heart disease Father   . Depression Father   . Renal Disease Father   . Alcohol abuse Brother   . Alcohol abuse Paternal Uncle     Physical Exam: Vitals:   02/14/17 1648 02/14/17 2038 02/15/17 0633 02/15/17 0634  BP: 123/80 120/85 107/72 109/69  Pulse: 96 (!) 115 84 (!) 108  Resp:   16   Temp:   98.4 F (36.9 C)   TempSrc:   Oral   SpO2:      Weight:      Height:        Constitutional: Alert and awake, oriented x3, not in any acute distress. Eyes: PERLA, EOMI, irises appear normal, anicteric sclera,  ENMT: external ears and nose appear normal, normal  hearing. Lips appears normal, oropharynx mucosa, tongue, posterior pharynx appear normal  Neck: neck appears normal, normal ROM, no JVD  CVS: S1-S2 clear, no murmur rubs or gallops, no LE edema, normal pedal pulses  Respiratory:  clear to auscultation bilaterally, no wheezing, rales or rhonchi. Respiratory effort normal. No accessory muscle use.  Abdomen: soft nontender, nondistended, normal bowel sounds, no hepatosplenomegaly, no hernias  Musculoskeletal: : Cast on right foot Neuro: Cranial nerves II-XII intact, strength, sensation, reflexes Psych:  judgement and insight appear normal, mood and affect depressed and flat. slowed speech. Alert and oriented. Skin: ecchymosis over right lateral ankle.   Data reviewed:  I have personally reviewed following labs and imaging studies Labs:  CBC:  Recent Labs Lab 02/12/17 2255 02/13/17 0550  WBC 6.0 6.2  NEUTROABS  --  4.0  HGB 8.8* 8.4*  HCT 27.5* 26.0*  MCV 79.0 79.5  PLT 426* 240    Basic Metabolic Panel:  Recent Labs Lab 02/12/17 2255  NA 141  K 3.1*  CL 108  CO2 22  GLUCOSE 101*  BUN 7  CREATININE 0.57  CALCIUM 7.9*   GFR Estimated Creatinine Clearance: 86.5 mL/min (by C-G formula based on SCr of 0.57 mg/dL). Liver Function Tests:  Recent Labs Lab 02/12/17 2255  AST 92*  ALT 39  ALKPHOS 97  BILITOT 0.5  PROT 6.7  ALBUMIN 3.6   No results for input(s): LIPASE, AMYLASE in the last 168 hours. No results for input(s): AMMONIA in the last 168 hours. Coagulation profile No results for input(s): INR, PROTIME in the last 168 hours.  Cardiac Enzymes: No results for input(s): CKTOTAL, CKMB, CKMBINDEX, TROPONINI in the last 168 hours. BNP: Invalid input(s): POCBNP CBG: No results for input(s): GLUCAP in the last 168 hours. D-Dimer No results for input(s): DDIMER in the last 72 hours. Hgb A1c No results for input(s): HGBA1C in the last 72 hours. Lipid Profile No results for input(s): CHOL, HDL, LDLCALC, TRIG, CHOLHDL, LDLDIRECT in the last 72 hours. Thyroid function studies No results for input(s): TSH, T4TOTAL, T3FREE, THYROIDAB in the last 72 hours.  Invalid input(s): FREET3 Anemia work up No results for input(s): VITAMINB12, FOLATE, FERRITIN, TIBC, IRON, RETICCTPCT in the last 72 hours. Urinalysis    Component Value Date/Time   COLORURINE YELLOW 01/16/2015 0818   APPEARANCEUR CLEAR 01/16/2015 0818   LABSPEC 1.020 01/16/2015 0818   PHURINE 6.0 01/16/2015 0818   GLUCOSEU NEGATIVE 01/16/2015 0818   HGBUR SMALL (A) 01/16/2015 0818   BILIRUBINUR  NEGATIVE 01/16/2015 0818   KETONESUR NEGATIVE 01/16/2015 0818   PROTEINUR 30 (A) 01/16/2015 0818   UROBILINOGEN 0.2 01/16/2015 0818   NITRITE NEGATIVE 01/16/2015 0818   LEUKOCYTESUR NEGATIVE 01/16/2015 0818     Microbiology No results found for this or any previous visit (from the past 240 hour(s)).     Inpatient Medications:   Scheduled Meds: . buPROPion  75 mg Oral Daily  . LORazepam  1 mg Oral QID   Followed by  . [START ON 02/16/2017] LORazepam  1 mg Oral TID   Followed by  . [START ON 02/17/2017] LORazepam  1 mg Oral BID   Followed by  . [START ON 02/19/2017] LORazepam  1 mg Oral Daily  . multivitamin with minerals  1 tablet Oral Daily  . pantoprazole  40 mg Oral Daily  . QUEtiapine  50 mg Oral QHS  . thiamine  100 mg Oral Daily  . traZODone  25 mg Oral QHS   Continuous Infusions:  Radiological Exams on Admission: No results found.  Impression/Recommendations Active Problems:   Major depressive disorder, recurrent severe without psychotic features ([place])  Major depression -per primary  Alcohol abuse Patient currently being detoxed -per primary  Melena This is a chronic issue per patient and she was undergoing workup in [place]. She was scheduled for a colonoscopy on 8/28. Hemoglobin down from over one year ago and patient states her baseline is around 9-10. No recent hemoglobin/hematocrit to compare. No recent melena and patient has had normal brown stools most recently. Patient may benefit from colonoscopy as an outpatient or if hemoglobin continues to trend down. For now, will watch hemoglobin. Vitals stable. -Fecal occult blood test -repeat CBC in PM  Anemia Normocytic. Likely chronic blood loss from likely GI bleeding. -anemia panel  ?Gastric cancer Received per sign out. Per patient, no knowledge of this. Patient with recent EGD that was not significant for cancer. Per chart review, patient with metastatic gastric cancer. Patient does not know the  name of her gastroenterologist in [place].  Essential hypertension On lisinopril as an outpatient. Blood pressure normotensive. -Ccontinue to hold lisinopril  Hypertensive cardiomyopathy Per medical history. Last echo in 2011 by Dr. Cathie Olden. Normal echocardiogram at that time. No evidence of heart failure on exam.  GERD Patient with chronic epigastric pain. Was on Zantac and Pepcid as an outpatient. -Agree with protonix  Non-displaced 1st and 5th proximal phalanges fractures of right foot Evaluated in the ED. Patient fell down the stairs over one week ago. ACE wrap in place with post-op shoe. -Recommend orthopedic follow-up at discharge -Continue ACE wrap and post op shoe   Thank you for this consultation.  Our [facility] hospitalist team will follow the patient with you.   Cordelia Poche M.D. [facility] 02/15/2017, 11:45 AM

## 2017-02-15 NOTE — Plan of Care (Signed)
Problem: Safety: Goal: Periods of time without injury will increase Outcome: Progressing Patient has not engaged in self harm, denies thoughts to do so. No Si expressed.  Problem: Medication: Goal: Compliance with prescribed medication regimen will improve Outcome: Progressing Patient is med compliant.

## 2017-02-16 DIAGNOSIS — F419 Anxiety disorder, unspecified: Secondary | ICD-10-CM

## 2017-02-16 DIAGNOSIS — D649 Anemia, unspecified: Secondary | ICD-10-CM

## 2017-02-16 DIAGNOSIS — Z87891 Personal history of nicotine dependence: Secondary | ICD-10-CM

## 2017-02-16 DIAGNOSIS — C169 Malignant neoplasm of stomach, unspecified: Secondary | ICD-10-CM

## 2017-02-16 DIAGNOSIS — K219 Gastro-esophageal reflux disease without esophagitis: Secondary | ICD-10-CM

## 2017-02-16 DIAGNOSIS — F1020 Alcohol dependence, uncomplicated: Secondary | ICD-10-CM

## 2017-02-16 LAB — IRON AND TIBC
Iron: 20 ug/dL — ABNORMAL LOW (ref 28–170)
Saturation Ratios: 4 % — ABNORMAL LOW (ref 10.4–31.8)
TIBC: 461 ug/dL — AB (ref 250–450)
UIBC: 441 ug/dL

## 2017-02-16 LAB — FERRITIN: Ferritin: 29 ng/mL (ref 11–307)

## 2017-02-16 LAB — FOLATE: FOLATE: 12.2 ng/mL (ref >5.9)

## 2017-02-16 LAB — VITAMIN B12: VITAMIN B 12: 545 pg/mL (ref 180–914)

## 2017-02-16 MED ORDER — QUETIAPINE FUMARATE 50 MG PO TABS
50.0000 mg | ORAL_TABLET | Freq: Every day | ORAL | Status: DC
  Administered 2017-02-16 – 2017-02-18 (×3): 50 mg via ORAL
  Filled 2017-02-16 (×3): qty 1
  Filled 2017-02-16 (×3): qty 7

## 2017-02-16 MED ORDER — IBUPROFEN 600 MG PO TABS
600.0000 mg | ORAL_TABLET | Freq: Four times a day (QID) | ORAL | Status: DC | PRN
  Administered 2017-02-16 – 2017-02-22 (×16): 600 mg via ORAL
  Filled 2017-02-16 (×16): qty 1

## 2017-02-16 NOTE — Progress Notes (Signed)
Chart reviewed, consult note by Dr. Lonny Prude reviewed.  1. Her melena seems to be a chronic problem which is currently being worked up by gastroenterology as an outpatient.  It also appears that she no longer has melena nor hematochezia.  Hemoglobin today is actually up when compared to the one yesterday indicating no active bleed.  Recommend to continue PPI, which she is already on. I am discontinuing her Ibuprofen and would advise against NSAID use in a patient with history of melena.  Could alternatively try Tylenol (limit to 2g / day and no more than 3-5 days in the setting of elevated AST due to ETOH abuse) or Tramadol. Will need to continue her GI workup in outpatient setting.  Will sign off, if patient's melena recurs or if she has recurrent bleeding while in [facility], please let us know. Thank you for this consult    Costin M. Cruzita Lederer, MD [facility] [phone]  If 7PM-7AM, please contact night-coverage [email or web address] Password [email or web address]

## 2017-02-16 NOTE — Tx Team (Signed)
Interdisciplinary Treatment and Diagnostic Plan Update  02/16/2017 Time of Session: [place] MRN: [id number]  Principal Diagnosis: Major depressive disorder, recurrent severe without psychotic features ([place])  Secondary Diagnoses: Principal Problem:   Major depressive disorder, recurrent severe without psychotic features ([place]) Active Problems:   Low hemoglobin   Gastric cancer (HCC)   Alcohol use disorder, moderate, dependence ([employer])   Current Medications:  Current Facility-Administered Medications  Medication Dose Route Frequency Provider Last Rate Last Dose  . acetaminophen (TYLENOL) tablet 650 mg  650 mg Oral Q6H PRN [name], NP      . alum & mag hydroxide-simeth (MAALOX/MYLANTA) 200-200-20 MG/5ML suspension 30 mL  30 mL Oral Q4H PRN [name], NP      . buPROPion [facility]) tablet 75 mg  75 mg Oral Daily Money, Lowry Ram, FNP   75 mg at 02/16/17 0820  . hydrOXYzine (ATARAX/VISTARIL) tablet 25 mg  25 mg Oral Q6H PRN [name], NP   25 mg at 02/15/17 2234  . ibuprofen (ADVIL,MOTRIN) tablet 400 mg  400 mg Oral Q6H PRN [name], NP   400 mg at 02/16/17 0820  . loperamide (IMODIUM) capsule 2-4 mg  2-4 mg Oral PRN [name], NP      . LORazepam (ATIVAN) tablet 1 mg  1 mg Oral Q6H PRN [name], NP      . LORazepam (ATIVAN) tablet 1 mg  1 mg Oral TID Rozetta Nunnery, NP       Followed by  . [START ON 02/17/2017] LORazepam (ATIVAN) tablet 1 mg  1 mg Oral BID Rozetta Nunnery, NP       Followed by  . [START ON 02/19/2017] LORazepam (ATIVAN) tablet 1 mg  1 mg Oral Daily [name], NP      . magnesium hydroxide (MILK OF MAGNESIA) suspension 30 mL  30 mL Oral Daily PRN [name], NP      . multivitamin with minerals tablet 1 tablet  1 tablet Oral Daily [name], NP   1 tablet at 02/16/17 0820  . ondansetron (ZOFRAN) tablet 8 mg  8 mg Oral Q8H PRN [name], NP   8 mg at 02/16/17 7026  . pantoprazole (PROTONIX) EC tablet 40 mg   40 mg Oral Daily [name], NP   40 mg at 02/16/17 0820  . QUEtiapine (SEROQUEL) tablet 50 mg  50 mg Oral QHS Money, Lowry Ram, FNP   50 mg at 02/15/17 2234  . thiamine (VITAMIN B-1) tablet 100 mg  100 mg Oral Daily [name], NP   100 mg at 02/16/17 0820  . white petrolatum (VASELINE) gel   Topical PRN [name], NP       PTA Medications: Prescriptions Prior to Admission  Medication Sig Dispense Refill Last Dose  . acetaminophen (TYLENOL) 500 MG tablet Take 1,000 mg by mouth every 6 (six) hours as needed for moderate pain.   Past Week at Unknown time  . buPROPion (WELLBUTRIN XL) 150 MG 24 hr tablet Take 150 mg by mouth daily.   Past Month at Unknown time  . buPROPion (WELLBUTRIN XL) 300 MG 24 hr tablet Take 1 tablet (300 mg total) by mouth daily. (Patient not taking: Reported on 06/23/2015) 30 tablet 0 Not Taking at Unknown time  . chlordiazePOXIDE (LIBRIUM) 25 MG capsule 50mg  PO TID x 1D, then 25-50mg  PO BID X 1D, then 25-50mg  PO QD X 1D (  Patient not taking: Reported on 02/13/2017) 10 capsule 0 Not Taking at Unknown time  . ibuprofen (ADVIL,MOTRIN) 200 MG tablet Take 800 mg by mouth every 6 (six) hours as needed for fever, headache, mild pain, moderate pain or cramping.    Past Week at Unknown time  . lisinopril (PRINIVIL,ZESTRIL) 5 MG tablet Take 1 tablet (5 mg total) by mouth daily. (Patient not taking: Reported on 02/13/2017) 30 tablet 0 Not Taking at Unknown time  . pantoprazole (PROTONIX) 40 MG tablet Take 1 tablet (40 mg total) by mouth daily. (Patient not taking: Reported on 02/13/2017) 30 tablet 1 Not Taking at Unknown time  . potassium chloride SA (K-DUR,KLOR-CON) 20 MEQ tablet Take 1 tablet (20 mEq total) by mouth daily. (Patient not taking: Reported on 06/23/2015) 5 tablet 0 Not Taking at Unknown time    Patient Stressors: Financial difficulties Health problems Substance abuse  Patient Strengths: Capable of independent living Curator fund of  knowledge Motivation for treatment/growth  Treatment Modalities: Medication Management, Group therapy, Case management,  1 to 1 session with clinician, Psychoeducation, Recreational therapy.   Physician Treatment Plan for Primary Diagnosis: Major depressive disorder, recurrent severe without psychotic features ([place]) Long Term Goal(s): Improvement in symptoms so as ready for discharge Improvement in symptoms so as ready for discharge   Short Term Goals: Ability to verbalize feelings will improve Ability to disclose and discuss suicidal ideas Ability to identify and develop effective coping behaviors will improve Compliance with prescribed medications will improve  Medication Management: Evaluate patient's response, side effects, and tolerance of medication regimen.  Therapeutic Interventions: 1 to 1 sessions, Unit Group sessions and Medication administration.  Evaluation of Outcomes: Progressing  Physician Treatment Plan for Secondary Diagnosis: Principal Problem:   Major depressive disorder, recurrent severe without psychotic features ([place]) Active Problems:   Low hemoglobin   Gastric cancer (HCC)   Alcohol use disorder, moderate, dependence ([place])  Long Term Goal(s): Improvement in symptoms so as ready for discharge Improvement in symptoms so as ready for discharge   Short Term Goals: Ability to verbalize feelings will improve Ability to disclose and discuss suicidal ideas Ability to identify and develop effective coping behaviors will improve Compliance with prescribed medications will improve     Medication Management: Evaluate patient's response, side effects, and tolerance of medication regimen.  Therapeutic Interventions: 1 to 1 sessions, Unit Group sessions and Medication administration.  Evaluation of Outcomes: Progressing   RN Treatment Plan for Primary Diagnosis: Major depressive disorder, recurrent severe without psychotic features ([place]) Long Term Goal(s):  Knowledge of disease and therapeutic regimen to maintain health will improve  Short Term Goals: Ability to remain free from injury will improve, Ability to demonstrate self-control and Ability to disclose and discuss suicidal ideas  Medication Management: RN will administer medications as ordered by provider, will assess and evaluate patient's response and provide education to patient for prescribed medication. RN will report any adverse and/or side effects to prescribing provider.  Therapeutic Interventions: 1 on 1 counseling sessions, Psychoeducation, Medication administration, Evaluate responses to treatment, Monitor vital signs and CBGs as ordered, Perform/monitor CIWA, COWS, AIMS and Fall Risk screenings as ordered, Perform wound care treatments as ordered.  Evaluation of Outcomes: Progressing   LCSW Treatment Plan for Primary Diagnosis: Major depressive disorder, recurrent severe without psychotic features ([place]) Long Term Goal(s): Safe transition to appropriate next level of care at discharge, Engage patient in therapeutic group addressing interpersonal concerns.  Short Term Goals: Engage patient in aftercare planning with referrals  and resources, Facilitate patient progression through stages of change regarding substance use diagnoses and concerns and Identify triggers associated with mental health/substance abuse issues  Therapeutic Interventions: Assess for all discharge needs, 1 to 1 time with Social worker, Explore available resources and support systems, Assess for adequacy in community support network, Educate family and significant other(s) on suicide prevention, Complete Psychosocial Assessment, Interpersonal group therapy.  Evaluation of Outcomes: Progressing   Progress in Treatment: Attending groups: Yes. Participating in groups: Yes. Taking medication as prescribed: Yes. Toleration medication: Yes. Family/Significant other contact made: No, will contact:  pt's brother for  collateral information and to complete SPE. Patient understands diagnosis: Yes. Discussing patient identified problems/goals with staff: Yes. Medical problems stabilized or resolved: Yes. Denies suicidal/homicidal ideation: Yes. Issues/concerns per patient self-inventory: No. Other: n/a   New problem(s) identified: Yes, Describe:  pt has possible stomach cancer diagnosis and will likely need to get treated for this before entering substance abuse treatment program.   New Short Term/Long Term Goal(s): alcohol detox; medication management for mood stabilization; development of comprehensive mental wellness/sobriety plan.   Patient Goal: "to get more energy and get my medications fixed."   Discharge Plan or Barriers: CSW assessing for appropriate referrals[facility] for outpatient treatment. Pt provideed with inpatient options for after her medical issues have been treated. [place] and AA/NA lists also provided to pt for additional community support.   Reason for Continuation of Hospitalization: Anxiety Depression Medication stabilization Withdrawal symptoms  Estimated Length of Stay: Wed 02/18/17  Attendees: Patient: 02/16/2017 10:08 AM  Physician: Dr. Parke Poisson MD 02/16/2017 10:08 AM  Nursing: Vladimir Faster RN; Christa RN 02/16/2017 10:08 AM  RN Care Manager: Lars Pinks CM 02/16/2017 10:08 AM  Social Worker: Press photographer, LCSW 02/16/2017 10:08 AM  Recreational Therapist: x 02/16/2017 10:08 AM  Other: Lindell Spar NP; Ricky Ala NP 02/16/2017 10:08 AM  Other:  02/16/2017 10:08 AM  Other: 02/16/2017 10:08 AM    Scribe for Treatment Team: [place], LCSW 02/16/2017 10:08 AM

## 2017-02-16 NOTE — Progress Notes (Signed)
Recreation Therapy Notes  Date: 02/16/17 Time: 0915 Location: 300 Hall Group Room  Group Topic: Stress Management  Goal Area(s) Addresses:  Patient will verbalize importance of using healthy stress management.  Patient will identify positive emotions associated with healthy stress management.   Intervention: Stress Management  Activity :  Body Scan Meditation.  LRT introduced the stress management technique of meditation.  LRT played a meditation from the Calm app to allow patients to take inventory of any tension, calmness or other sensations they may have been feeling.  Patients were to follow along as the meditation played.  Education:  Stress Management, Discharge Planning.   Education Outcome: Acknowledges edcuation/In group clarification offered/Needs additional education  Clinical Observations/Feedback: Pt did not attend group.    Victorino Sparrow, LRT/CTRS         Ria Comment, Jahmal Dunavant A 02/16/2017 11:50 AM

## 2017-02-16 NOTE — Progress Notes (Signed)
Patient attended AA group meeting.  

## 2017-02-16 NOTE — Progress Notes (Signed)
[facility] MD Progress Note  02/16/2017 2:38 PM Shannon Cervantes  MRN:  [id number]  Subjective: Shannon Cervantes reports, "I'm feeling a little stronger today. I slept better last last night, I woke up groggy. I still feel weak. My blood is low. This has been going on. My doctor has done endoscopy, but nothing was found to be the reason for the low hemoglobin. I have a follow-up appointment next week with my doctor for more lab work-up. My fractured right foot is hurting a lot today".  Objective: On admission, patient states she is depressed, suicidal, wanted to drink self to death , has recent diagnosis of Gastric Ca. Came to [place] from [place] , has family here, parents and her son. Her Hgb is dropping, hospitalist consult made. Pt OK to continue Wellbutrin for now , will make further med changes once her labs are back,. She has her appointment to see her provider for gastric cancer at end of this month. Today, Shannon Cervantes reports feeling a little stronger. She reports improved sleep, only that she woke up groggy. Her Seroquel dosing time has been scheduled at 9:00 PM each night to help with the morning grogginess. She is complaining of pain to her fractured right foot. Ibuprofen 600 mg ordered prn Q 6 hours. She is wearing her right ortho-boot. Right foot wrapped in ace bandage. Shannon Cervantes denies any SIHI, AVH, delusional thoughts or paranoia.   Principal Problem: Major depressive disorder, recurrent severe without psychotic features ([place])  Diagnosis:   Patient Active Problem List   Diagnosis Date Noted  . Low hemoglobin [D64.9] 02/15/2017  . Gastric cancer ([place]) [C16.9] 02/15/2017  . Alcohol use disorder, moderate, dependence ([place]) [F10.20] 02/15/2017  . Viral gastroenteritis [A08.4] 01/11/2015  . Needle stick injury of finger [S61.239A, W27.3XXA] 12/11/2014  . Essential hypertension [I10] 12/11/2014  . Cough [R05] 12/11/2014  . Major depressive disorder, recurrent severe without psychotic features ([place]) [F33.2] 11/13/2014   . PTSD (post-traumatic stress disorder) [F43.10] 11/13/2014  . Alcohol use disorder, severe, dependence ([place]) [F10.20] 11/12/2014  . Suicidal ideation [R45.851] 11/12/2014  . 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 Chest pain [R07.9] 11/02/2013  . Obesity [E66.9] 08/21/2011  . Hypertensive cardiomyopathy ([place]) [I11.9, I43] 08/20/2011   Total Time spent with patient: 25 minutes  Past Psychiatric History: Alcohol use disorder.  Past Medical History:  Past Medical History:  Diagnosis Date  . Alcohol abuse   . Anxiety   . Depression   . Foot fracture 01-07-2011  . Hyperlipidemia   . Hypertensive cardiomyopathy ([place])     Past Surgical History:  Procedure Laterality Date  . CESAREAN SECTION    . GASTRIC BYPASS     Family History:  Family History  Problem Relation Age of Onset  . Hypertension Mother   . Heart disease Mother   . Hyperlipidemia Mother   . Depression Mother   . Colon cancer Mother   . Heart disease Father   . Depression Father   . Renal Disease Father   . Alcohol abuse Brother   . Alcohol abuse Paternal Uncle    Family Psychiatric  History: See H&P  Social History:  History  Alcohol Use  . Yes    Comment: daily use-1 1/2 pints cognac     History  Drug Use No    Social History   Social History  . Marital status: Divorced    Spouse name: N/A  . Number of children: 4  . Years of education: 51   Occupational  History  . RN    Social History Main Topics  . Smoking status: Former Smoker    Packs/day: 0.01    Types: Cigarettes  . Smokeless tobacco: Never Used     Comment: 1 Cigarettes every other Day  . Alcohol use Yes     Comment: daily use-1 1/2 pints cognac  . Drug use: No  . Sexual activity: No   Other Topics Concern  . None   Social History Narrative   Fun: Travel and shopping   Denies religious beliefs effecting healthcare.    Additional Social History:   Sleep: Fair  Appetite:  Fair  Current  Medications: Current Facility-Administered Medications  Medication Dose Route Frequency Provider Last Rate Last Dose  . acetaminophen (TYLENOL) tablet 650 mg  650 mg Oral Q6H PRN [name], NP      . alum & mag hydroxide-simeth (MAALOX/MYLANTA) 200-200-20 MG/5ML suspension 30 mL  30 mL Oral Q4H PRN [name], NP      . buPROPion [facility]) tablet 75 mg  75 mg Oral Daily Money, Lowry Ram, FNP   75 mg at 02/16/17 0820  . hydrOXYzine (ATARAX/VISTARIL) tablet 25 mg  25 mg Oral Q6H PRN [name], NP   25 mg at 02/15/17 2234  . loperamide (IMODIUM) capsule 2-4 mg  2-4 mg Oral PRN [name], NP      . LORazepam (ATIVAN) tablet 1 mg  1 mg Oral Q6H PRN [name], NP      . LORazepam (ATIVAN) tablet 1 mg  1 mg Oral TID Rozetta Nunnery, NP       Followed by  . [START ON 02/17/2017] LORazepam (ATIVAN) tablet 1 mg  1 mg Oral BID Rozetta Nunnery, NP       Followed by  . [START ON 02/19/2017] LORazepam (ATIVAN) tablet 1 mg  1 mg Oral Daily [name], NP      . magnesium hydroxide (MILK OF MAGNESIA) suspension 30 mL  30 mL Oral Daily PRN [name], NP      . multivitamin with minerals tablet 1 tablet  1 tablet Oral Daily [name], NP   1 tablet at 02/16/17 0820  . ondansetron (ZOFRAN) tablet 8 mg  8 mg Oral Q8H PRN [name], NP   8 mg at 02/16/17 9629  . pantoprazole (PROTONIX) EC tablet 40 mg  40 mg Oral Daily [name], NP   40 mg at 02/16/17 0820  . QUEtiapine (SEROQUEL) tablet 50 mg  50 mg Oral QHS [place], Agnes I, NP      . thiamine (VITAMIN B-1) tablet 100 mg  100 mg Oral Daily [name], NP   100 mg at 02/16/17 0820  . white petrolatum (VASELINE) gel   Topical PRN [name], NP        Lab Results:  Results for orders placed or performed during the hospital encounter of 02/14/17 (from the past 48 hour(s))  CBC     Status: Abnormal   Collection Time: 02/15/17  6:45 PM  Result Value Ref Range   WBC 5.5 4.0 - 10.5 K/uL   RBC 3.35 (L)  3.87 - 5.11 MIL/uL   Hemoglobin 8.7 (L) 12.0 - 15.0 g/dL   HCT 27.3 (L) 36.0 - 46.0 %   MCV 81.5 78.0 - 100.0 fL   MCH 26.0 26.0 - 34.0 pg   MCHC 31.9 30.0 - 36.0 g/dL   RDW 17.0 (  H) 11.5 - 15.5 %   Platelets 419 (H) 150 - 400 K/uL    Comment: Performed at [facility], [place] [address], [place], [place] [address]  Basic metabolic panel     Status: Abnormal   Collection Time: 02/15/17  6:45 PM  Result Value Ref Range   Sodium 137 135 - 145 mmol/L   Potassium 3.5 3.5 - 5.1 mmol/L   Chloride 107 101 - 111 mmol/L   CO2 22 22 - 32 mmol/L   Glucose, Bld 122 (H) 65 - 99 mg/dL   BUN 12 6 - 20 mg/dL   Creatinine, Ser 0.62 0.44 - 1.00 mg/dL   Calcium 8.5 (L) 8.9 - 10.3 mg/dL   GFR calc non Af Amer >60 >60 mL/min   GFR calc Af Amer >60 >60 mL/min    Comment: (NOTE) The eGFR has been calculated using the CKD EPI equation. This calculation has not been validated in all clinical situations. eGFR's persistently <60 mL/min signify possible Chronic Kidney Disease.    Anion gap 8 5 - 15    Comment: Performed at [facility], [place] [address], [place], [place] [address]  Vitamin B12     Status: None   Collection Time: 02/15/17  6:45 PM  Result Value Ref Range   Vitamin B-12 545 180 - 914 pg/mL    Comment: (NOTE) This assay is not validated for testing neonatal or myeloproliferative syndrome specimens for Vitamin B12 levels. Performed at [facility], [place] [address], [place], [place] [address]   Folate     Status: None   Collection Time: 02/15/17  6:45 PM  Result Value Ref Range   Folate 12.2 >5.9 ng/mL    Comment: Performed at [facility] [address] [place], [place] [address]  Iron and TIBC     Status: Abnormal   Collection Time: 02/15/17  6:45 PM  Result Value Ref Range   Iron 20 (L) 28 - 170 ug/dL   TIBC 461 (H) 250 - 450 ug/dL   Saturation Ratios 4 (L) 10.4 - 31.8 %   UIBC 441 ug/dL    Comment: Performed at [facility], [place] [address], [place], [place] [address]  Ferritin     Status: None   Collection Time: 02/15/17  6:45 PM  Result Value Ref Range   Ferritin 29 11 - 307 ng/mL    Comment: Performed at [facility], [place] [address] [place], [place] [address]  Reticulocytes     Status: Abnormal   Collection Time: 02/15/17  6:45 PM  Result Value Ref Range   Retic Ct Pct 3.7 (H) 0.4 - 3.1 %   RBC. 3.35 (L) 3.87 - 5.11 MIL/uL   Retic Count, Absolute 124.0 19.0 - 186.0 K/uL    Comment: Performed at [facility], [place] [address] [place], [place] [address]   Blood Alcohol level:  Lab Results  Component Value Date   ETH [facility]) 02/12/2017   ETH <5 09/98/3382   Metabolic Disorder Labs: No results found for: HGBA1C, MPG No results found for: PROLACTIN Lab Results  Component Value Date   CHOL 167 10/31/2014   TRIG 62.0 10/31/2014   HDL 86.90 10/31/2014   CHOLHDL 2 10/31/2014   VLDL 12.4 10/31/2014   LDLCALC 68 10/31/2014   LDLCALC 100 (H) 08/20/2011   Physical Findings: AIMS: Facial and Oral Movements Muscles of Facial Expression: None, normal Lips and Perioral Area: None,  normal Jaw: None, normal Tongue: None, normal,Extremity Movements Upper (arms, wrists, hands, fingers): None, normal Lower (legs, knees, ankles, toes): None, normal, Trunk Movements Neck, shoulders, hips: None, normal, Overall Severity Severity of abnormal movements (highest score from questions above): None, normal Incapacitation due to abnormal movements: None, normal Patient's awareness of abnormal movements (rate only patient's report): No Awareness, Dental Status Current problems with teeth and/or dentures?: No Does patient usually wear dentures?: No  CIWA:  CIWA-Ar Total: 5 COWS:     Musculoskeletal: Strength & Muscle Tone: within normal limits Gait & Station: normal Patient leans: N/A  Psychiatric Specialty Exam: Physical Exam  Review of Systems  Psychiatric/Behavioral: Positive for  depression and substance abuse. Negative for suicidal ideas.    Blood pressure 118/82, pulse (!) 120, temperature 98.9 F (37.2 C), temperature source Oral, resp. rate 16, height 5' 4" (1.626 m), weight 72.1 kg (159 lb), SpO2 100 %.Body mass index is 27.29 kg/m.  General Appearance: Fairly Groomed  Eye Contact:  Fair  Speech:  Normal Rate  Volume:  Normal  Mood:  Anxious, Depressed and Dysphoric  Affect:  Congruent  Thought Process:  Goal Directed and Descriptions of Associations: Circumstantial  Orientation:  Full (Time, Place, and Person)  Thought Content:  Rumination  Suicidal Thoughts:  Yes.  with intent/plan  Homicidal Thoughts:  No  Memory:  Immediate;   Fair Recent;   Fair Remote;   Fair  Judgement:  Fair  Insight:  Fair  Psychomotor Activity:  Normal  Concentration:  Concentration: Fair and Attention Span: Fair  Recall:  [employer] of Knowledge:  Fair  Language:  Fair  Akathisia:  No  Handed:  Right  AIMS (if indicated):     Assets:  Desire for Improvement  ADL's:  Intact  Cognition:  WNL  Sleep:  Number of Hours: 6.5     Treatment Plan Summary: Daily contact with patient to assess and evaluate symptoms and progress in treatment.  Will continue today 02/15/17 plan as below except where it is noted. -Continue the Ativan detox protocols for alcohol detoxification treatments. -Continue Wellbutrin 75 mg daily for depression. -Continue Hydroxyzine 25 mg prn for anxiety Q 6 hours -Continue Seroquel 50 mg Q 9:00 PM for mood control. -Continue Protonix 40 mg for acid reflux. -Reviewed current lab reports: HGB 8.7 -Continue Ibuprofen 600 mg Qid prn for pain. - Continue 15 minutes observation for safety concerns - Encouraged to participate in milieu therapy and group therapy counseling sessions and also work with coping skills -  Develop treatment plan to decrease risk of relapse upon discharge and to reduce the need for readmission. -  Psycho-social education regarding  relapse prevention and self care. - Health care follow up as needed for medical problems. - Restart home medications where appropriate.  Encarnacion Slates, NP, PMHNP, FNP-BC. 02/16/2017, 2:38 PM   Agree with NP Progress Note

## 2017-02-16 NOTE — Progress Notes (Signed)
Patient denies SI, HI, and AVH this shift, but reports increased depression and symptoms of alcohol withdrawal. Patient has been compliant with medications.   Assess patient for safety offer medications as prescribed, engage patient in 1:1 staff talks.     Patient able to contract for safety. Continue to monitor as prescribed.

## 2017-02-16 NOTE — [facility] Group Notes (Signed)
[place] LCSW Group Therapy  02/16/2017 1:25 PM  Type of Therapy:  Group Therapy  Participation Level:  Active  Participation Quality:  Attentive  Affect:  Appropriate  Cognitive:  Alert and Oriented  Insight:  Improving  Engagement in Therapy:  Improving  Modes of Intervention:  Discussion, Education, Exploration, Socialization and Support  Summary of Progress/Problems: M[facility] Speaker came to talk about his personal journey with mental illness. The pt processed ways by which to relate to the speaker. [place] speaker provided handouts and educational information pertaining to groups and services offered by the [facility].   Kajuana Shareef N Smart LCSW 02/16/2017, 1:25 PM

## 2017-02-17 DIAGNOSIS — S92901D Unspecified fracture of right foot, subsequent encounter for fracture with routine healing: Secondary | ICD-10-CM

## 2017-02-17 DIAGNOSIS — D469 Myelodysplastic syndrome, unspecified: Secondary | ICD-10-CM

## 2017-02-17 NOTE — Progress Notes (Addendum)
[facility] MD Progress Note  02/17/2017 6:10 PM Shannon Cervantes  MRN:  [id number] Subjective:   46 y.o. AAF, single, lives alone. Background history of AUD, recently diagnosed with stomach cancer. Patient was involuntarily committed after she expressed suicidal thoughts by drinking self to death. BAL was 368 at admission.  Chart reviewed today. Patient discussed at team  Staff reports that she has been appropriate. Not expressing any suicidal thoughts. No behavioral issues  Seen today. Denies being suicidal. States that she made statement passively. Denies any intent to harm self. Says she has an 9 yo son that she has to raise. Admits heavy drinking lately. She would consider rehab. No sweatiness, no headaches. No retching, nausea or vomiting. No fullness in the head. No visual, tactile or auditory hallucination. No internal restlessness. No current suicidal or homicidal thoughts.     Principal Problem: Major depressive disorder, recurrent severe without psychotic features ([place]) Diagnosis:   Patient Active Problem List   Diagnosis Date Noted  . Low hemoglobin [D64.9] 02/15/2017  . Gastric cancer ([place]) [C16.9] 02/15/2017  . Alcohol use disorder, moderate, dependence ([place]) [F10.20] 02/15/2017  . Viral gastroenteritis [A08.4] 01/11/2015  . Needle stick injury of finger [S61.239A, W27.3XXA] 12/11/2014  . Essential hypertension [I10] 12/11/2014  . Cough [R05] 12/11/2014  . Major depressive disorder, recurrent severe without psychotic features ([place]) [F33.2] 11/13/2014  . PTSD (post-traumatic stress disorder) [F43.10] 11/13/2014  . Alcohol use disorder, severe, dependence ([place]) [F10.20] 11/12/2014  . Suicidal ideation [R45.851] 11/12/2014  . 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 Chest pain [R07.9] 11/02/2013  . Obesity [E66.9] 08/21/2011  . Hypertensive cardiomyopathy ([place]) [I11.9, I43] 08/20/2011   Total Time spent with patient: 20 minutes  Past Psychiatric History: As in  H&P  Past Medical History:  Past Medical History:  Diagnosis Date  . Alcohol abuse   . Anxiety   . Depression   . Foot fracture 01-07-2011  . Hyperlipidemia   . Hypertensive cardiomyopathy ([place])     Past Surgical History:  Procedure Laterality Date  . CESAREAN SECTION    . GASTRIC BYPASS     Family History:  Family History  Problem Relation Age of Onset  . Hypertension Mother   . Heart disease Mother   . Hyperlipidemia Mother   . Depression Mother   . Colon cancer Mother   . Heart disease Father   . Depression Father   . Renal Disease Father   . Alcohol abuse Brother   . Alcohol abuse Paternal Uncle    Family Psychiatric  History: As in H&P Social History:  History  Alcohol Use  . Yes    Comment: daily use-1 1/2 pints cognac     History  Drug Use No    Social History   Social History  . Marital status: Divorced    Spouse name: N/A  . Number of children: 4  . Years of education: 74   Occupational History  . RN    Social History Main Topics  . Smoking status: Former Smoker    Packs/day: 0.01    Types: Cigarettes  . Smokeless tobacco: Never Used     Comment: 1 Cigarettes every other Day  . Alcohol use Yes     Comment: daily use-1 1/2 pints cognac  . Drug use: No  . Sexual activity: No   Other Topics Concern  . None   Social History Narrative   Fun: Travel and shopping   Denies religious beliefs effecting healthcare.  Additional Social History:    Sleep: Fair  Appetite:  Good  Current Medications: Current Facility-Administered Medications  Medication Dose Route Frequency Provider Last Rate Last Dose  . acetaminophen (TYLENOL) tablet 650 mg  650 mg Oral Q6H PRN [name], NP      . alum & mag hydroxide-simeth (MAALOX/MYLANTA) 200-200-20 MG/5ML suspension 30 mL  30 mL Oral Q4H PRN [name], NP      . buPROPion [facility]) tablet 75 mg  75 mg Oral Daily Money, Lowry Ram, FNP   75 mg at 02/17/17 0745  . ibuprofen (ADVIL,MOTRIN)  tablet 600 mg  600 mg Oral Q6H PRN [name], NP   600 mg at 02/17/17 1516  . LORazepam (ATIVAN) tablet 1 mg  1 mg Oral Q6H PRN [name], NP      . LORazepam (ATIVAN) tablet 1 mg  1 mg Oral BID Rozetta Nunnery, NP       Followed by  . [START ON 02/19/2017] LORazepam (ATIVAN) tablet 1 mg  1 mg Oral Daily [name], NP      . magnesium hydroxide (MILK OF MAGNESIA) suspension 30 mL  30 mL Oral Daily PRN [name], NP      . multivitamin with minerals tablet 1 tablet  1 tablet Oral Daily [name], NP   1 tablet at 02/17/17 0745  . ondansetron (ZOFRAN) tablet 8 mg  8 mg Oral Q8H PRN [name], NP   8 mg at 02/17/17 0748  . pantoprazole (PROTONIX) EC tablet 40 mg  40 mg Oral Daily [name], NP   40 mg at 02/17/17 0745  . QUEtiapine (SEROQUEL) tablet 50 mg  50 mg Oral QHS [name], NP   50 mg at 02/16/17 2157  . thiamine (VITAMIN B-1) tablet 100 mg  100 mg Oral Daily [name], NP   100 mg at 02/17/17 0746  . white petrolatum (VASELINE) gel   Topical PRN [name], NP        Lab Results:  Results for orders placed or performed during the hospital encounter of 02/14/17 (from the past 48 hour(s))  CBC     Status: Abnormal   Collection Time: 02/15/17  6:45 PM  Result Value Ref Range   WBC 5.5 4.0 - 10.5 K/uL   RBC 3.35 (L) 3.87 - 5.11 MIL/uL   Hemoglobin 8.7 (L) 12.0 - 15.0 g/dL   HCT 27.3 (L) 36.0 - 46.0 %   MCV 81.5 78.0 - 100.0 fL   MCH 26.0 26.0 - 34.0 pg   MCHC 31.9 30.0 - 36.0 g/dL   RDW 17.0 (H) 11.5 - 15.5 %   Platelets 419 (H) 150 - 400 K/uL    Comment: Performed at [facility], [place] [address] [place], [place] [address]  Basic metabolic panel     Status: Abnormal   Collection Time: 02/15/17  6:45 PM  Result Value Ref Range   Sodium 137 135 - 145 mmol/L   Potassium 3.5 3.5 - 5.1 mmol/L   Chloride 107 101 - 111 mmol/L   CO2 22 22 - 32 mmol/L   Glucose, Bld 122 (H) 65 - 99 mg/dL   BUN 12 6 - 20 mg/dL    Creatinine, Ser 0.62 0.44 - 1.00 mg/dL   Calcium 8.5 (L) 8.9 - 10.3 mg/dL   GFR calc non Af Amer >60 >60 mL/min   GFR calc Af Amer >60 >60  mL/min    Comment: (NOTE) The eGFR has been calculated using the CKD EPI equation. This calculation has not been validated in all clinical situations. eGFR's persistently <60 mL/min signify possible Chronic Kidney Disease.    Anion gap 8 5 - 15    Comment: Performed at [facility], [address] [place], [place] [address]  Vitamin B12     Status: None   Collection Time: 02/15/17  6:45 PM  Result Value Ref Range   Vitamin B-12 545 180 - 914 pg/mL    Comment: (NOTE) This assay is not validated for testing neonatal or myeloproliferative syndrome specimens for Vitamin B12 levels. Performed at [facility], [address] [place], [place] [address]   Folate     Status: None   Collection Time: 02/15/17  6:45 PM  Result Value Ref Range   Folate 12.2 >5.9 ng/mL    Comment: Performed at [facility], [address] [place], [place] [address]  Iron and TIBC     Status: Abnormal   Collection Time: 02/15/17  6:45 PM  Result Value Ref Range   Iron 20 (L) 28 - 170 ug/dL   TIBC 876 (H) 811 - 572 ug/dL   Saturation Ratios 4 (L) 10.4 - 31.8 %   UIBC 441 ug/dL    Comment: Performed at [facility], [address] [place], [place] [address]  Ferritin     Status: None   Collection Time: 02/15/17  6:45 PM  Result Value Ref Range   Ferritin 29 11 - 307 ng/mL    Comment: Performed at [facility], [address] [place], [place] [address]  Reticulocytes     Status: Abnormal   Collection Time: 02/15/17  6:45 PM  Result Value Ref Range   Retic Ct Pct 3.7 (H) 0.4 - 3.1 %   RBC. 3.35 (L) 3.87 - 5.11 MIL/uL   Retic Count, Absolute 124.0 19.0 - 186.0 K/uL    Comment: Performed at [facility], [address], [place], [place] [address]    Blood Alcohol level:  Lab Results  Component Value  Date   ETH 368 [facility]) 02/12/2017   ETH <5 06/24/2015    Metabolic Disorder Labs: No results found for: HGBA1C, MPG No results found for: PROLACTIN Lab Results  Component Value Date   CHOL 167 10/31/2014   TRIG 62.0 10/31/2014   HDL 86.90 10/31/2014   CHOLHDL 2 10/31/2014   VLDL 12.4 10/31/2014   LDLCALC 68 10/31/2014   LDLCALC 100 (H) 08/20/2011    Physical Findings: AIMS: Facial and Oral Movements Muscles of Facial Expression: None, normal Lips and Perioral Area: None, normal Jaw: None, normal Tongue: None, normal,Extremity Movements Upper (arms, wrists, hands, fingers): None, normal Lower (legs, knees, ankles, toes): None, normal, Trunk Movements Neck, shoulders, hips: None, normal, Overall Severity Severity of abnormal movements (highest score from questions above): None, normal Incapacitation due to abnormal movements: None, normal Patient's awareness of abnormal movements (rate only patient's report): No Awareness, Dental Status Current problems with teeth and/or dentures?: No Does patient usually wear dentures?: No  CIWA:  CIWA-Ar Total: 0 COWS:     Musculoskeletal: Strength & Muscle Tone: within normal limits Gait & Station: unsteady Patient leans: N/A  Psychiatric Specialty Exam: Physical Exam  Constitutional: No distress.  HENT:  Head: Normocephalic and atraumatic.  Respiratory: Effort normal.  Neurological: She is alert.  Skin: Skin is warm and dry. She is not  diaphoretic.  Psychiatric:  As above    ROS  Blood pressure 120/78, pulse (!) 103, temperature 99 F (37.2 C), temperature source Oral, resp. rate 16, height _0  (1.626 m), weight 72.1 kg (159 lb), SpO2 100 %.Body mass index is 27.29 kg/m.  General Appearance: Calm and cooperative. No tremors. Not sweaty. Not confused.   Eye Contact:  Good  Speech:  Soft spoken. Normal tone  Volume:  Normal  Mood:  Mood is better  Affect:  Appropriate and Restricted  Thought Process:  Linear  Orientation:   Full (Time, Place, and Person)  Thought Content: No delusional theme. No preoccupation with violent thoughts. No negative ruminations. No obsession.  No hallucination in any modality.   Suicidal Thoughts:  No  Homicidal Thoughts:  No  Memory:  WNL  Judgement:  Good  Insight:  Fair  Psychomotor Activity:  Normal  Concentration:  Concentration: Good and Attention Span: Good  Recall:  Good  Fund of Knowledge:  Good  Language:  Good  Akathisia:  Negative  Handed:    AIMS (if indicated):     Assets:  Communication Skills Desire for Improvement  ADL's:  Intact  Cognition:  WNL  Sleep:  Number of Hours: 5.75    Assessment and Plan   Patient is no longer in withdrawals. Patient is not pervasively depressed.  She has a terminal illness. She is still processing it. She is not a danger to herself or others. She is motivated to stay sober. We are finalizing aftercare. Hopeful discharge in a day or two.   Psychiatric: AUD  Medical: Cancer of the stomach Cardiomyopathy   Psychosocial:   PLAN: 1. Continue current regimen. 2. Continue to monitor mood, behavior and interaction with peers.    [name], MD 02/17/2017, 6:10 PM

## 2017-02-17 NOTE — Progress Notes (Signed)
Patient denies SI, HI, and AVH this shift, but reports increased depression and symptoms of alcohol withdrawal. Patient has been compliant with medications.   Assess patient for safety offer medications as prescribed, engage patient in 1:1 staff talks.     Patient able to contract for safety. Continue to monitor as prescribed.

## 2017-02-17 NOTE — [facility] Group Notes (Signed)
LCSW Group Therapy Note   02/17/2017 1:15pm   Type of Therapy and Topic:  Group Therapy:  Overcoming Obstacles   Participation Level:  Did Not Attend-pt invited. Chose to remain in bed.    Description of Group:    In this group patients will be encouraged to explore what they see as obstacles to their own wellness and recovery. They will be guided to discuss their thoughts, feelings, and behaviors related to these obstacles. The group will process together ways to cope with barriers, with attention given to specific choices patients can make. Each patient will be challenged to identify changes they are motivated to make in order to overcome their obstacles. This group will be process-oriented, with patients participating in exploration of their own experiences as well as giving and receiving support and challenge from other group members.   Therapeutic Goals: 1. Patient will identify personal and current obstacles as they relate to admission. 2. Patient will identify barriers that currently interfere with their wellness or overcoming obstacles.  3. Patient will identify feelings, thought process and behaviors related to these barriers. 4. Patient will identify two changes they are willing to make to overcome these obstacles:      Summary of Patient Progress n/a     Therapeutic Modalities:   Cognitive Behavioral Therapy Solution Focused Therapy Motivational Interviewing Relapse Prevention Therapy  Kimber Relic Smart, LCSW 02/17/2017 12:55 PM

## 2017-02-17 NOTE — Progress Notes (Signed)
Pt attended AA meeting this evening.  

## 2017-02-17 NOTE — Progress Notes (Signed)
D: Pt was in dayroom upon initial approach.  Pt presents with depressed affect and anxious, depressed mood.  Her goal is "trying to manage my foot, my nerves."  Pt denies SI/HI, denies hallucinations, reports R foot pain of 5/10.  Pt has been visible in milieu interacting with peers and staff appropriately.  Pt attended evening group.    A: Introduced self to pt.  Actively listened to pt and offered support and encouragement. Medication administered per order.  PRN medication administered for pain and anxiety.  Elevated R foot.  Heat packs provided for pain.  Q15 minute safety checks maintained.  R: Pt is safe on the unit.  Pt is compliant with medications.  Pt verbally contracts for safety.  Will continue to monitor and assess.

## 2017-02-17 NOTE — Progress Notes (Signed)
Nursing Progress Note: 7p-7a D: Pt currently presents with a depressed/anxious/sad affect and behavior. Pt states "I had a bad day. Learned I'm going to have to have some run ins with the court (would not ellaborate). It's like I have to start over again. I don't get my ativan anymore? I need it." Interacting minimally with the milieu. Pt reports good sleep during the previous night with current medication regimen. Pt did attend wrap-up group.  A: Pt provided with medications per providers orders. Pt's labs and vitals were monitored throughout the night. Pt supported emotionally and encouraged to express concerns and questions. Pt educated on medications.  R: Pt's safety ensured with 15 minute and environmental checks. Pt currently denies SI, HI, and AVH. Pt verbally contracts to seek staff if SI,HI, or AVH occurs and to consult with staff before acting on any harmful thoughts. Will continue to monitor.

## 2017-02-18 MED ORDER — VENLAFAXINE HCL ER 75 MG PO CP24
75.0000 mg | ORAL_CAPSULE | Freq: Every day | ORAL | Status: DC
  Filled 2017-02-18 (×4): qty 1

## 2017-02-18 MED ORDER — GABAPENTIN 300 MG PO CAPS
300.0000 mg | ORAL_CAPSULE | Freq: Three times a day (TID) | ORAL | Status: DC
  Administered 2017-02-18: 300 mg via ORAL
  Filled 2017-02-18 (×7): qty 1

## 2017-02-18 MED ORDER — VENLAFAXINE HCL 37.5 MG PO TABS
37.5000 mg | ORAL_TABLET | Freq: Once | ORAL | Status: AC
  Administered 2017-02-18: 37.5 mg via ORAL
  Filled 2017-02-18 (×2): qty 1

## 2017-02-18 MED ORDER — GABAPENTIN 100 MG PO CAPS
100.0000 mg | ORAL_CAPSULE | Freq: Three times a day (TID) | ORAL | Status: DC
  Administered 2017-02-19 – 2017-02-20 (×4): 100 mg via ORAL
  Filled 2017-02-18 (×14): qty 1

## 2017-02-18 NOTE — Progress Notes (Signed)
Nursing Progress Note: 7p-7a D: Pt currently presents with a sad/flat/irratable affect and behavior. Pt states "I had a bad reaction to those meds they gave me earlier. I'm too scared to take any meds." Interacting appropriately with the milieu. Pt reports fair sleep during the previous night with current medication regimen. Pt did attend wrap-up group.  A: Pt provided with medications per providers orders. Pt's labs and vitals were monitored throughout the night. Pt supported emotionally and encouraged to express concerns and questions. Pt educated on medications.  R: Pt's safety ensured with 15 minute and environmental checks. Pt currently denies SI, HI, and AVH. Pt verbally contracts to seek staff if SI,HI, or AVH occurs and to consult with staff before acting on any harmful thoughts. Will continue to monitor.

## 2017-02-18 NOTE — [facility] Group Notes (Signed)
LCSW Group Therapy Note  02/18/2017 1:15pm  Type of Therapy/Topic:  Group Therapy:  Balance in Life  Participation Level:  Active  Description of Group:    This group will address the concept of balance and how it feels and looks when one is unbalanced. Patients will be encouraged to process areas in their lives that are out of balance and identify reasons for remaining unbalanced. Facilitators will guide patients in utilizing problem-solving interventions to address and correct the stressor making their life unbalanced. Understanding and applying boundaries will be explored and addressed for obtaining and maintaining a balanced life. Patients will be encouraged to explore ways to assertively make their unbalanced needs known to significant others in their lives, using other group members and facilitator for support and feedback.  Therapeutic Goals: 1. Patient will identify two or more emotions or situations they have that consume much of in their lives. 2. Patient will identify signs/triggers that life has become out of balance:  3. Patient will identify two ways to set boundaries in order to achieve balance in their lives:  4. Patient will demonstrate ability to communicate their needs through discussion and/or role plays  Summary of Patient Progress:  Described loneliness as being trigger for depression/substance use. "I am fine at work, I can distract myself, then I come home and I am alone.  I drink to forget."  Became tearful when describing her desire for relationships but also distrust of others due to past difficulties in relationships."  Chose picture of [place] ruin to illustrate balance "I need to build my coping skills brick by brick into a firm foundation."    Therapeutic Modalities:   Public relations account executive Therapy Solution-Focused Therapy Assertiveness Training  Beverely Pace, LCSW 02/18/2017 3:01 PM

## 2017-02-18 NOTE — Progress Notes (Signed)
Recreation Therapy Notes  Date:  02/18/17 Time: 0930 Location: [address] Dayroom  Group Topic: Stress Management  Goal Area(s) Addresses:  Patient will verbalize importance of using healthy stress management.  Patient will identify positive emotions associated with healthy stress management.   Behavioral Response: Engaged  Intervention: Stress Management  Activity :  Guided Imagery.  LRT introduced the stress management technique of guided imagery.  LRT read a script so patients could follow along and imagine being in their peaceful place.  Patients were to follow along as LRT read the script to engage in the activity.  Education:  Stress Management, Discharge Planning.   Education Outcome: Acknowledges edcuation/In group clarification offered/Needs additional education  Clinical Observations/Feedback:  Pt attended group.   Victorino Sparrow, LRT/CTRS         Victorino Sparrow A 02/18/2017 12:10 PM

## 2017-02-18 NOTE — Progress Notes (Signed)
[facility] MD Progress Note  02/18/2017 11:29 AM Shannon Cervantes  MRN:  [id number] Subjective:   46 y.o. AAF, single, lives alone. Background history of AUD, recently diagnosed with stomach cancer. Patient was involuntarily committed after she expressed suicidal thoughts by drinking self to death. BAL was 368 mg/dl at admission.  Chart reviewed today. Patient discussed at team  Staff reports that she is worried about her court case. She sits out at the dayroom most of the day. She has not voiced any suicidal thoughts. Craving for ativan as she is still craving psychologically.   Seen today. States that she just does not feel right this morning. She feels anxious. She skipped her Wellbutrin as it typically makes it worse.  Says she tried Sertraline in the past. Responded well but had to discontinue it as it blunted her emotions and ability to enjoy things. Says she could not tolerate Mirtazapine as it made her tired during the day. She has never tried any SNRI. Continues to have some cravings. Says she wants to get into long term treatment. She did a program once years ago in [place]. States that her family has been supportive. No suicidal thoughts. No thoughts of violence. No associated psychotic features.     Principal Problem: Major depressive disorder, recurrent severe without psychotic features ([place]) Diagnosis:   Patient Active Problem List   Diagnosis Date Noted  . Low hemoglobin [D64.9] 02/15/2017  . Gastric cancer ([place]) [C16.9] 02/15/2017  . Alcohol use disorder, moderate, dependence ([place]) [F10.20] 02/15/2017  . Viral gastroenteritis [A08.4] 01/11/2015  . Needle stick injury of finger [S61.239A, W27.3XXA] 12/11/2014  . Essential hypertension [I10] 12/11/2014  . Cough [R05] 12/11/2014  . Major depressive disorder, recurrent severe without psychotic features ([place]) [F33.2] 11/13/2014  . PTSD (post-traumatic stress disorder) [F43.10] 11/13/2014  . Alcohol use disorder, severe, dependence  ([place]) [F10.20] 11/12/2014  . Suicidal ideation [R45.851] 11/12/2014  . 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 Chest pain [R07.9] 11/02/2013  . Obesity [E66.9] 08/21/2011  . Hypertensive cardiomyopathy ([place]) [I11.9, I43] 08/20/2011   Total Time spent with patient: 20 minutes  Past Psychiatric History: As in H&P  Past Medical History:  Past Medical History:  Diagnosis Date  . Alcohol abuse   . Anxiety   . Depression   . Foot fracture 01-07-2011  . Hyperlipidemia   . Hypertensive cardiomyopathy ([place])     Past Surgical History:  Procedure Laterality Date  . CESAREAN SECTION    . GASTRIC BYPASS     Family History:  Family History  Problem Relation Age of Onset  . Hypertension Mother   . Heart disease Mother   . Hyperlipidemia Mother   . Depression Mother   . Colon cancer Mother   . Heart disease Father   . Depression Father   . Renal Disease Father   . Alcohol abuse Brother   . Alcohol abuse Paternal Uncle    Family Psychiatric  History: As in H&P Social History:  History  Alcohol Use  . Yes    Comment: daily use-1 1/2 pints cognac     History  Drug Use No    Social History   Social History  . Marital status: Divorced    Spouse name: N/A  . Number of children: 4  . Years of education: 58   Occupational History  . RN    Social History Main Topics  . Smoking status: Former Smoker    Packs/day: 0.01    Types: Cigarettes  .  Smokeless tobacco: Never Used     Comment: 1 Cigarettes every other Day  . Alcohol use Yes     Comment: daily use-1 1/2 pints cognac  . Drug use: No  . Sexual activity: No   Other Topics Concern  . None   Social History Narrative   Fun: Travel and shopping   Denies religious beliefs effecting healthcare.    Additional Social History:    Sleep: Fair  Appetite:  Good  Current Medications: Current Facility-Administered Medications  Medication Dose Route Frequency Provider Last Rate Last Dose  .  acetaminophen (TYLENOL) tablet 650 mg  650 mg Oral Q6H PRN [name], NP      . alum & mag hydroxide-simeth (MAALOX/MYLANTA) 200-200-20 MG/5ML suspension 30 mL  30 mL Oral Q4H PRN [name], NP      . buPROPion [facility]) tablet 75 mg  75 mg Oral Daily Money, Lowry Ram, FNP   75 mg at 02/18/17 0841  . ibuprofen (ADVIL,MOTRIN) tablet 600 mg  600 mg Oral Q6H PRN [name], NP   600 mg at 02/18/17 0914  . [START ON 02/19/2017] LORazepam (ATIVAN) tablet 1 mg  1 mg Oral Daily [name], NP      . magnesium hydroxide (MILK OF MAGNESIA) suspension 30 mL  30 mL Oral Daily PRN [name], NP      . multivitamin with minerals tablet 1 tablet  1 tablet Oral Daily [name], NP   1 tablet at 02/18/17 0839  . ondansetron (ZOFRAN) tablet 8 mg  8 mg Oral Q8H PRN [name], NP   8 mg at 02/18/17 5784  . pantoprazole (PROTONIX) EC tablet 40 mg  40 mg Oral Daily [name], NP   40 mg at 02/18/17 0840  . QUEtiapine (SEROQUEL) tablet 50 mg  50 mg Oral QHS [name], NP   50 mg at 02/17/17 2307  . thiamine (VITAMIN B-1) tablet 100 mg  100 mg Oral Daily [name], NP   100 mg at 02/18/17 0840  . white petrolatum (VASELINE) gel   Topical PRN [name], NP        Lab Results:  No results found for this or any previous visit (from the past 48 hour(s)).  Blood Alcohol level:  Lab Results  Component Value Date   ETH 368 (HH) 02/12/2017   ETH <5 69/62/9528    Metabolic Disorder Labs: No results found for: HGBA1C, MPG No results found for: PROLACTIN Lab Results  Component Value Date   CHOL 167 10/31/2014   TRIG 62.0 10/31/2014   HDL 86.90 10/31/2014   CHOLHDL 2 10/31/2014   VLDL 12.4 10/31/2014   LDLCALC 68 10/31/2014   LDLCALC 100 (H) 08/20/2011    Physical Findings: AIMS: Facial and Oral Movements Muscles of Facial Expression: None, normal Lips and Perioral Area: None, normal Jaw: None, normal Tongue: None, normal,Extremity Movements Upper  (arms, wrists, hands, fingers): None, normal Lower (legs, knees, ankles, toes): None, normal, Trunk Movements Neck, shoulders, hips: None, normal, Overall Severity Severity of abnormal movements (highest score from questions above): None, normal Incapacitation due to abnormal movements: None, normal Patient's awareness of abnormal movements (rate only patient's report): No Awareness, Dental Status Current problems with teeth and/or dentures?: No Does patient usually wear dentures?: No  CIWA:  CIWA-Ar Total: 0 COWS:     Musculoskeletal: Strength & Muscle Tone: within normal limits Gait & Station: unsteady Patient leans: N/A  Psychiatric Specialty Exam: Physical Exam  Constitutional: No distress.  HENT:  Head: Normocephalic and atraumatic.  Respiratory: Effort normal.  Neurological: She is alert.  Skin: Skin is warm and dry. She is not diaphoretic.  Psychiatric:  As above    ROS  Blood pressure 110/72, pulse (!) 101, temperature 99.4 F (37.4 C), temperature source Oral, resp. rate 16, height 5\' 4"  (1.626 m), weight 72.1 kg (159 lb), SpO2 100 %.Body mass index is 27.29 kg/m.  General Appearance: Tense look, not sweaty, not shaky. Not confused or internally preoccupied.   Eye Contact:  Good  Speech:  Low rate and tone  Volume:  Soft spoken  Mood:  Not feeling good today.   Affect:  Restricted  Thought Process:  Linear  Orientation:  Full (Time, Place, and Person)  Thought Content: No delusional theme. No preoccupation with violent thoughts. No negative ruminations. No obsession.  No hallucination in any modality.   Suicidal Thoughts:  No  Homicidal Thoughts:  No  Memory:  WNL  Judgement:  Good  Insight:  Good  Psychomotor Activity:  Normal  Concentration:  Concentration: Good and Attention Span: Good  Recall:  Good  Fund of Knowledge:  Good  Language:  Good  Akathisia:  Negative  Handed:    AIMS (if indicated):     Assets:  Communication Skills Desire for  Improvement  ADL's:  Intact  Cognition:  WNL  Sleep:  Number of Hours: 6.25    Assessment and Plan   Patient is no longer in physiological withdrawals. She is anxious and depressed. Bupropion makes her more anxious. We have agreed to switch her to Venlafaxine. She consented to treatment after we explored the risks and benefits.  She also consented to use of Gabapentin to target anxiety and psychological craving.  Patient is open to inpatient addiction treatment. She understands we might not be able to achieve it directly from here. Needs further stabilization.  Psychiatric: AUD MDD Anxiety Disorder  Medical: Cancer of the stomach Cardiomyopathy   Psychosocial:   PLAN: 1. Hold Bupropion 2. Venlafaxine Xl 37. 5 mg daily. Would titrate gradually 3. Gabapentin 300 mg TID 4. SW would gather collateral from her family. Goal is to explore risk to self further.  5. SW would facilitate addiction inpatient treatment and aftercare.     [name], MD 02/18/2017, 11:29 AMPatient ID: Shannon Cervantes, female   DOB: 08-Oct-1970, 46 y.o.   MRN: [id number]

## 2017-02-18 NOTE — Progress Notes (Signed)
Patient ID: Shannon Cervantes, female   DOB: 10-30-1970, 46 y.o.   MRN: [id number] D  ----  Pt agrees to contract for safety.  She has been cooperative and pleasant to staff and interacts well with peers.  She maintains a stressed affect due to fracture of right ankle/foot .  Pt has only asked for medications for pain after attempting to deal with pain on her own.  Pt is high fall risk due to the FX.  Pt had new medications prescribed this shift.  She had a negative reaction to one other meds ( Neurontin ).  The Dr. Ricard Dillon the dose starting tomorrow.  Pt haas refused  Neurontin this evening.  Since med reaction, pt has remained in her room in bed.  --- A ---  Provide support and safety  --- R ---  Pt remains safe and plesant

## 2017-02-18 NOTE — [facility] Suicide Risk Assessment (Signed)
Shannon Cervantes INPATIENT:  Family/Significant Other Suicide Prevention Education  Suicide Prevention Education:  Education Completed; Shannon Cervantes (pt's brother) [phone] has been identified by the patient as the family member/significant other with whom the patient will be residing, and identified as the person(s) who will aid the patient in the event of a mental health crisis (suicidal ideations/suicide attempt).  With written consent from the patient, the family member/significant other has been provided the following suicide prevention education, prior to the and/or following the discharge of the patient.  The suicide prevention education provided includes the following:  Suicide risk factors  Suicide prevention and interventions  National Suicide Hotline telephone number  [facility] assessment telephone number  [facility] Emergency Assistance [place] and/or Residential Mobile Crisis Unit telephone number  Request made of family/significant other to:  Remove weapons (e.g., guns, rifles, knives), all items previously/currently identified as safety concern.    Remove drugs/medications (over-the-counter, prescriptions, illicit drugs), all items previously/currently identified as a safety concern.  The family member/significant other verbalizes understanding of the suicide prevention education information provided.  The family member/significant other agrees to remove the items of safety concern listed above.  SPE and aftercare options reviewed with pt's brother. He reports that she sounds "80% better" when he talks to her on the phone but is concerned that she will relapse on alcohol if not admitted to residential treatment. CSW explored his concerns and made him aware that major medical issues and legal problems may have to be resolved or treated first before she is eligible for inpatient treatment. He verbalized understanding of above and was offered support for  himself and his family. He has no concerns regarding pt's safety on this time.   Dacey Milberger N Smart LCSW 02/18/2017, 11:48 AM

## 2017-02-19 MED ORDER — ESCITALOPRAM OXALATE 5 MG PO TABS: 5.0000 mg | ORAL_TABLET | Freq: Every day | ORAL | Status: DC

## 2017-02-19 MED ORDER — ESCITALOPRAM OXALATE 5 MG PO TABS
5.0000 mg | ORAL_TABLET | Freq: Every day | ORAL | Status: DC
  Administered 2017-02-20: 5 mg via ORAL
  Filled 2017-02-19 (×3): qty 1

## 2017-02-19 MED ORDER — QUETIAPINE FUMARATE 25 MG PO TABS
25.0000 mg | ORAL_TABLET | Freq: Every day | ORAL | Status: DC
  Administered 2017-02-19: 25 mg via ORAL
  Filled 2017-02-19 (×4): qty 1

## 2017-02-19 MED ORDER — FERROUS SULFATE 325 (65 FE) MG PO TABS
325.0000 mg | ORAL_TABLET | Freq: Every day | ORAL | Status: DC
  Administered 2017-02-20 – 2017-02-25 (×6): 325 mg via ORAL
  Filled 2017-02-19 (×6): qty 1
  Filled 2017-02-19: qty 7
  Filled 2017-02-19: qty 1

## 2017-02-19 NOTE — Plan of Care (Signed)
Problem: Education: Goal: Verbalization of understanding the information provided will improve Outcome: Progressing Patient verbalizes understanding of information and education provided.  Problem: Safety: Goal: Ability to remain free from injury will improve Outcome: Completed/Met Date Met: 02/19/17 Patient has not engaged in self harm and denies thoughts to do so. Denies SI.

## 2017-02-19 NOTE — [facility] Group Notes (Signed)
LCSW Group Therapy Note  02/19/2017 1:15pm  Type of Therapy/Topic:  Group Therapy:  Balance in Life  Participation Level:  Minimal  Description of Group:    This group will address the concept of balance and how it feels and looks when one is unbalanced. Patients will be encouraged to process areas in their lives that are out of balance and identify reasons for remaining unbalanced. Facilitators will guide patients in utilizing problem-solving interventions to address and correct the stressor making their life unbalanced. Understanding and applying boundaries will be explored and addressed for obtaining and maintaining a balanced life. Patients will be encouraged to explore ways to assertively make their unbalanced needs known to significant others in their lives, using other group members and facilitator for support and feedback.  Therapeutic Goals: 1. Patient will identify two or more emotions or situations they have that consume much of in their lives. 2. Patient will identify signs/triggers that life has become out of balance:  3. Patient will identify two ways to set boundaries in order to achieve balance in their lives:  4. Patient will demonstrate ability to communicate their needs through discussion and/or role plays  Summary of Patient Progress: .      Therapeutic Modalities:   Cognitive Behavioral Therapy Solution-Focused Therapy Assertiveness Training  Wray Kearns, Lucien 02/19/2017 2:33 PM

## 2017-02-19 NOTE — Progress Notes (Addendum)
[facility] MD Progress Note  02/19/2017 10:00 AM Shannon Cervantes  MRN:  [id number] Subjective:  Reports she is feeling " a little better", and feels withdrawal symptoms have subsided . Denies suicidal ideations at this time.   Reports she felt dizzy and lightheaded yesterday following being given Effexor XR and Neurontin.  Objective:  I have reviewed case with treatment team and have met with patient. 46 year old female, reports history of alcohol dependence, which she states started a few years ago. She had relapsed in November 2017 . She reports history of GI illness, and states she has been anemic, leading to medical work up. Chart notes indicate history of gastric cancer, patient states " that's not clear yet, I had an endoscopy and they told me they could not find anything, and I had a follow up appointment but I missed it because I had relapsed ". Patient presents alert, attentive, reports she is feeling partially better, less depressed . At this time no tremors, no diaphoresis, no acute distress, BP 138/86, pulse 116.  Ambulates slowly due to foot fracture, which she states occurred in the context of a blackout . As above, reports she had negative reaction to " either Effexor or Neurontin, but I think it was Effexor that did it because I took Neurontin today and I feel OK".        Principal Problem: Major depressive disorder, recurrent severe without psychotic features ([place]) Diagnosis:   Patient Active Problem List   Diagnosis Date Noted  . Low hemoglobin [D64.9] 02/15/2017  . Gastric cancer ([place]) [C16.9] 02/15/2017  . Alcohol use disorder, moderate, dependence ([place]) [F10.20] 02/15/2017  . Viral gastroenteritis [A08.4] 01/11/2015  . Needle stick injury of finger [S61.239A, W27.3XXA] 12/11/2014  . Essential hypertension [I10] 12/11/2014  . Cough [R05] 12/11/2014  . Major depressive disorder, recurrent severe without psychotic features ([place]) [F33.2] 11/13/2014  . PTSD (post-traumatic stress  disorder) [F43.10] 11/13/2014  . Alcohol use disorder, severe, dependence ([place]) [F10.20] 11/12/2014  . Suicidal ideation [R45.851] 11/12/2014  . 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 Chest pain [R07.9] 11/02/2013  . Obesity [E66.9] 08/21/2011  . Hypertensive cardiomyopathy ([place]) [I11.9, I43] 08/20/2011   Total Time spent with patient: 20 minutes  Past Psychiatric History: As in H&P  Past Medical History:  Past Medical History:  Diagnosis Date  . Alcohol abuse   . Anxiety   . Depression   . Foot fracture 01-07-2011  . Hyperlipidemia   . Hypertensive cardiomyopathy ([place])     Past Surgical History:  Procedure Laterality Date  . CESAREAN SECTION    . GASTRIC BYPASS     Family History:  Family History  Problem Relation Age of Onset  . Hypertension Mother   . Heart disease Mother   . Hyperlipidemia Mother   . Depression Mother   . Colon cancer Mother   . Heart disease Father   . Depression Father   . Renal Disease Father   . Alcohol abuse Brother   . Alcohol abuse Paternal Uncle    Family Psychiatric  History: As in H&P Social History:  History  Alcohol Use  . Yes    Comment: daily use-1 1/2 pints cognac     History  Drug Use No    Social History   Social History  . Marital status: Divorced    Spouse name: N/A  . Number of children: 4  . Years of education: 34   Occupational History  . RN  Social History Main Topics  . Smoking status: Former Smoker    Packs/day: 0.01    Types: Cigarettes  . Smokeless tobacco: Never Used     Comment: 1 Cigarettes every other Day  . Alcohol use Yes     Comment: daily use-1 1/2 pints cognac  . Drug use: No  . Sexual activity: No   Other Topics Concern  . None   Social History Narrative   Fun: Travel and shopping   Denies religious beliefs effecting healthcare.    Additional Social History:    Sleep: improving   Appetite:  Good  Current Medications: Current Facility-Administered  Medications  Medication Dose Route Frequency Provider Last Rate Last Dose  . acetaminophen (TYLENOL) tablet 650 mg  650 mg Oral Q6H PRN [name], NP   650 mg at 02/18/17 1503  . alum & mag hydroxide-simeth (MAALOX/MYLANTA) 200-200-20 MG/5ML suspension 30 mL  30 mL Oral Q4H PRN [name], NP      . gabapentin (NEURONTIN) capsule 100 mg  100 mg Oral TID Izediuno, Laruth Bouchard, MD      . ibuprofen (ADVIL,MOTRIN) tablet 600 mg  600 mg Oral Q6H PRN [name], NP   600 mg at 02/19/17 0831  . magnesium hydroxide (MILK OF MAGNESIA) suspension 30 mL  30 mL Oral Daily PRN [name], NP      . multivitamin with minerals tablet 1 tablet  1 tablet Oral Daily [name], NP   1 tablet at 02/19/17 [phone]  . ondansetron (ZOFRAN) tablet 8 mg  8 mg Oral Q8H PRN [name], NP   8 mg at 02/19/17 0831  . pantoprazole (PROTONIX) EC tablet 40 mg  40 mg Oral Daily [name], NP   40 mg at 02/19/17 2707  . QUEtiapine (SEROQUEL) tablet 50 mg  50 mg Oral QHS [name], NP   50 mg at 02/18/17 2229  . thiamine (VITAMIN B-1) tablet 100 mg  100 mg Oral Daily [name], NP   100 mg at 02/19/17 8675  . venlafaxine XR (EFFEXOR-XR) 24 hr capsule 75 mg  75 mg Oral Daily Izediuno, Vincent A, MD      . white petrolatum (VASELINE) gel   Topical PRN [name], NP        Lab Results:  No results found for this or any previous visit (from the past 48 hour(s)).  Blood Alcohol level:  Lab Results  Component Value Date   ETH 368 (HH) 02/12/2017   ETH <5 44/92/0100    Metabolic Disorder Labs: No results found for: HGBA1C, MPG No results found for: PROLACTIN Lab Results  Component Value Date   CHOL 167 10/31/2014   TRIG 62.0 10/31/2014   HDL 86.90 10/31/2014   CHOLHDL 2 10/31/2014   VLDL 12.4 10/31/2014   LDLCALC 68 10/31/2014   LDLCALC 100 (H) 08/20/2011    Physical Findings: AIMS: Facial and Oral Movements Muscles of Facial Expression: None, normal Lips and Perioral  Area: None, normal Jaw: None, normal Tongue: None, normal,Extremity Movements Upper (arms, wrists, hands, fingers): None, normal Lower (legs, knees, ankles, toes): None, normal, Trunk Movements Neck, shoulders, hips: None, normal, Overall Severity Severity of abnormal movements (highest score from questions above): None, normal Incapacitation due to abnormal movements: None, normal Patient's awareness of abnormal movements (rate only patient's report): No Awareness, Dental Status Current problems with teeth and/or dentures?: No Does patient usually wear dentures?: No  CIWA:  CIWA-Ar  Total: 0 COWS:     Musculoskeletal: Strength & Muscle Tone: within normal limits Gait & Station: unsteady Patient leans: N/A  Psychiatric Specialty Exam: Physical Exam  Constitutional: No distress.  HENT:  Head: Normocephalic and atraumatic.  Respiratory: Effort normal.  Neurological: She is alert.  Skin: Skin is warm and dry. She is not diaphoretic.  Psychiatric:  As above    ROS  Mild headache, denies chest pain, no shortness of breath, no vomiting , states she is no longer feeling dizzy but did feel slightly dizzy this AM. No rash.  Blood pressure 138/86, pulse (!) 116, temperature 99.4 F (37.4 C), temperature source Oral, resp. rate 16, height _0  (1.626 m), weight 72.1 kg (159 lb), SpO2 100 %.Body mass index is 27.29 kg/m.  General Appearance: fairly groomed   Eye Contact:  Good  Speech: Normal   Volume:  Normal  Mood:  Remains depressed, but states she is feeling partially better today  Affect:  Remains constricted, but smiles briefly at times   Thought Process:  Linear and Descriptions of Associations: Intact  Orientation:  Full (Time, Place, and Person)  Thought Content: denies hallucinations, no delusions, not internally preoccupied   Suicidal Thoughts:  No denies any suicidal or self injurious ideations, denies homicidal ideations , able to contract for safety on the unit    Homicidal Thoughts:  No  Memory:   Recent and remote grossly intact   Judgement:  Other:  improving   Insight:  Improving   Psychomotor Activity:  improving   Concentration:  Concentration: Good and Attention Span: Good  Recall:  Good  Fund of Knowledge:  Good  Language:  Good  Akathisia:  Negative  Handed:    AIMS (if indicated):     Assets:  Communication Skills Desire for Improvement  ADL's:  Intact  Cognition:  WNL  Sleep:  Number of Hours: 6.25    Assessment - patient reports ongoing depression, but states that today she is starting to feel " a little better". Denies current suicidal ideations. She is not presenting with significant alcohol withdrawal symptoms at this time. Reports having had episode of dizziness , nausea, and feeling " sick" following administration of Effexor and Neurontin , both of which are new medications for her. At this time feels it was Effexor causing side effects, as she has taken Neurontin today without side effects. She wants to stop Effexor , " I am scared of it now".   Plan  Treatment plan reviewed as below today 8/30 Encourage ongoing group and milieu participation to work on coping skills and symptoms reduction Encourage efforts to work on sobriety and relapse prevention  Treatment team working on discharge planning options D/C Effexor XR - see rationale above  Start Lexapro 5 mgrs QDAY for depression, anxiety, side effects reviewed  Decrease Seroquel to 25 mgrs QHS for mood disorder, depression, insomnia- rationale to decrease is to minimize potential side effects Continue Neurontin 100 mgrs TID for anxiety, pain Recheck CBC , Hemmocult, and start FeSO4 for anemia. She is on Protonix .     Jenne Campus, MD 02/19/2017, 10:00 AM   Patient ID: Shannon Cervantes, female   DOB: July 16, 1970, 46 y.o.   MRN: [id number]

## 2017-02-19 NOTE — Progress Notes (Signed)
D: Pt denies SI/HI/AVH. Pt is pleasant and cooperative. Pt interacts with certain peers, but keeps to self at times . Pt engages in conversation, pt concerned about relapsing on ETOH, pt stated she was still trying to work out the root cause of her drinking.   A: Pt was offered support and encouragement. Pt was given scheduled medications. Pt was encourage to attend groups. Q 15 minute checks were done for safety.   R:Pt attends groups and interacts well with peers and staff. Pt is taking medication. Pt has no complaints.Pt receptive to treatment and safety maintained on unit.

## 2017-02-19 NOTE — Progress Notes (Signed)
Patient has tolerated neurontin dose from late morning. On observation patient is calmer and she reports feeling better. Did elect to take 1700 dose. Did not take effexor today but states she will consider for tomorrow. MD made aware in AM huddle today of patient's concerns.

## 2017-02-19 NOTE — Plan of Care (Signed)
Problem: Safety: Goal: Periods of time without injury will increase Outcome: Progressing Pt safe on the unit at this time   

## 2017-02-19 NOTE — Progress Notes (Addendum)
D: Patient observed ambulating slowly, unsteady and weak at times. Frequent contacts made 1:1 throughout morning as patient was upset with roommate she has had on 300 for 2 days. Was subsequently moved to 400 hall. Patient verbalizes to this writer her concern regarding new meds started yesterday. "I was given neurontin and effexor at the same time and the dose was high on the neurontin." Patient's affect flat, depressed with irritable mood. Per self inventory and discussions with writer, rates depression, hopelessness and anxiety all at a 10/10. Rates sleep as poor, appetite as poor, energy as low and concentration as good. States goal for today is to "push through side effects of medications." Complaining of R foot pain of a 10/10 this AM and patient reports nausea. Reports only being able to eat small amount of breakfast.    A: Medicated per orders, prn advil and zofran given for complaints. Education give regarding reduction in neurontin dose and general med education provided. Level III obs in place for safety. Emotional support offered and self inventory reviewed. Encouraged completion of Suicide Safety Plan and programming participation. Discussed POC with MD, SW.  Fall prevention plan in place and reviewed with patient as pt is a high fall risk due to hx of falls and complaints of weakness, dizziness during this admit.   R: Patient verbalizes understanding of POC, falls prevention education. On reassess, patient denies pain and states nausea is decreased. Patient initially refused neurontin and effexor however late morning did take neurontin dose (1200 dose held).  Patient denies SI/HI/AVH and remains safe on level III obs. Will continue to monitor closely and make verbal contact frequently.   Add note: Patient reminded stool sample is still needed. Reminded to notify staff once BM occurs. Patient verbalized understanding.

## 2017-02-19 NOTE — Progress Notes (Signed)
Date: 02/19/17 Time: 0900 Group: Nursing  Patient invited; declined to attend.  Comer Locket., RN

## 2017-02-20 LAB — CBC WITH DIFFERENTIAL/PLATELET
Basophils Absolute: 0.1 10*3/uL (ref 0.0–0.1)
Basophils Relative: 1 %
EOS ABS: 0.3 10*3/uL (ref 0.0–0.7)
Eosinophils Relative: 4 %
HEMATOCRIT: 28.8 % — AB (ref 36.0–46.0)
HEMOGLOBIN: 9.3 g/dL — AB (ref 12.0–15.0)
LYMPHS ABS: 2.8 10*3/uL (ref 0.7–4.0)
Lymphocytes Relative: 39 %
MCH: 26.6 pg (ref 26.0–34.0)
MCHC: 32.3 g/dL (ref 30.0–36.0)
MCV: 82.3 fL (ref 78.0–100.0)
Monocytes Absolute: 0.8 10*3/uL (ref 0.1–1.0)
Monocytes Relative: 12 %
NEUTROS ABS: 3.2 10*3/uL (ref 1.7–7.7)
NEUTROS PCT: 44 %
Platelets: 363 10*3/uL (ref 150–400)
RBC: 3.5 MIL/uL — AB (ref 3.87–5.11)
RDW: 18.3 % — ABNORMAL HIGH (ref 11.5–15.5)
WBC: 7.2 10*3/uL (ref 4.0–10.5)

## 2017-02-20 LAB — BASIC METABOLIC PANEL
Anion gap: 8 (ref 5–15)
BUN: 9 mg/dL (ref 6–20)
CHLORIDE: 109 mmol/L (ref 101–111)
CO2: 22 mmol/L (ref 22–32)
Calcium: 9 mg/dL (ref 8.9–10.3)
Creatinine, Ser: 0.47 mg/dL (ref 0.44–1.00)
GFR calc non Af Amer: >60 mL/min (ref >60)
Glucose, Bld: 93 mg/dL (ref 65–99)
POTASSIUM: 3.8 mmol/L (ref 3.5–5.1)
SODIUM: 139 mmol/L (ref 135–145)

## 2017-02-20 MED ORDER — HYDROXYZINE HCL 25 MG PO TABS
25.0000 mg | ORAL_TABLET | Freq: Four times a day (QID) | ORAL | Status: DC | PRN
  Administered 2017-02-20 – 2017-02-24 (×7): 25 mg via ORAL
  Filled 2017-02-20 (×7): qty 1
  Filled 2017-02-20: qty 10

## 2017-02-20 MED ORDER — ESCITALOPRAM OXALATE 10 MG PO TABS
10.0000 mg | ORAL_TABLET | Freq: Every day | ORAL | Status: DC
  Administered 2017-02-21: 10 mg via ORAL
  Filled 2017-02-20 (×3): qty 1

## 2017-02-20 NOTE — [facility] Group Notes (Signed)
LCSW Group Therapy Note  02/20/2017 1:15pm  Type of Therapy and Topic:  Group Therapy:  Feelings around Relapse and Recovery  Participation Level:  Did Not Attend   Description of Group:    Patients in this group will discuss emotions they experience before and after a relapse. They will process how experiencing these feelings, or avoidance of experiencing them, relates to having a relapse. Facilitator will guide patients to explore emotions they have related to recovery. Patients will be encouraged to process which emotions are more powerful. They will be guided to discuss the emotional reaction significant others in their lives may have to their relapse or recovery. Patients will be assisted in exploring ways to respond to the emotions of others without this contributing to a relapse.  Therapeutic Goals: 1. Patient will identify two or more emotions that lead to a relapse for them 2. Patient will identify two emotions that result when they relapse 3. Patient will identify two emotions related to recovery 4. Patient will demonstrate ability to communicate their needs through discussion and/or role plays   Therapeutic Modalities:   Cognitive Behavioral Therapy Solution-Focused Therapy Assertiveness Training Relapse Prevention Therapy   Gladstone Lighter, LCSW 02/20/2017 2:05 PM

## 2017-02-20 NOTE — Progress Notes (Addendum)
[facility] MD Progress Note  02/20/2017 9:24 AM Shannon Cervantes  MRN:  [id number] Subjective:  Patient reports she is feeling " a little bit better", but reports ongoing depression, some anxiety, and reports she is feeling nauseous and " weak". She reports she had felt dizzy this AM but now improved . Denies medication side effects. Denies suicidal ideations . Objective:  I have reviewed case with treatment team and have met with patient. Patient presents depressed, but affect is partially more reactive , smiles briefly at times . She denies suicidal ideations . No psychotic symptoms. She is future oriented, and is thinking of returning to [place], [place], soon after discharge. She has history of alcohol dependence, and is interested in a rehab setting . Today we reviewed recent  GI symptoms and history further- patient states that due to upper GI symptoms and anemia she had an endoscopy in [place] in June- was told it showed gastritis, but was told to follow up to discuss - missed follow up . She states there is a history of colon cancer in family, and states she has not yet had a colonoscopy done, which was presumably going to be scheduled at said appointment as well . Of note, denies heavy menses , denies melenas at this time. She reports ongoing nausea, intermittent dizziness . At this time gait slow, but steady. Labs reviewed- Hgb improving compared to prior- has increased from 8.4 to 9.3 over last week.          Principal Problem: Major depressive disorder, recurrent severe without psychotic features ([place]) Diagnosis:   Patient Active Problem List   Diagnosis Date Noted  . Low hemoglobin [D64.9] 02/15/2017  . Gastric cancer ([place]) [C16.9] 02/15/2017  . Alcohol use disorder, moderate, dependence ([place]) [F10.20] 02/15/2017  . Viral gastroenteritis [A08.4] 01/11/2015  . Needle stick injury of finger [S61.239A, W27.3XXA] 12/11/2014  . Essential hypertension [I10] 12/11/2014  . Cough [R05] 12/11/2014   . Major depressive disorder, recurrent severe without psychotic features ([place]) [F33.2] 11/13/2014  . PTSD (post-traumatic stress disorder) [F43.10] 11/13/2014  . Alcohol use disorder, severe, dependence ([place]) [F10.20] 11/12/2014  . Suicidal ideation [R45.851] 11/12/2014  . 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 Chest pain [R07.9] 11/02/2013  . Obesity [E66.9] 08/21/2011  . Hypertensive cardiomyopathy ([place]) [I11.9, I43] 08/20/2011   Total Time spent with patient: 20 minutes  Past Psychiatric History: As in H&P  Past Medical History:  Past Medical History:  Diagnosis Date  . Alcohol abuse   . Anxiety   . Depression   . Foot fracture 01-07-2011  . Hyperlipidemia   . Hypertensive cardiomyopathy ([place])     Past Surgical History:  Procedure Laterality Date  . CESAREAN SECTION    . GASTRIC BYPASS     Family History:  Family History  Problem Relation Age of Onset  . Hypertension Mother   . Heart disease Mother   . Hyperlipidemia Mother   . Depression Mother   . Colon cancer Mother   . Heart disease Father   . Depression Father   . Renal Disease Father   . Alcohol abuse Brother   . Alcohol abuse Paternal Uncle    Family Psychiatric  History: As in H&P Social History:  History  Alcohol Use  . Yes    Comment: daily use-1 1/2 pints cognac     History  Drug Use No    Social History   Social History  . Marital status: Divorced    Spouse name:  N/A  . Number of children: 4  . Years of education: 83   Occupational History  . RN    Social History Main Topics  . Smoking status: Former Smoker    Packs/day: 0.01    Types: Cigarettes  . Smokeless tobacco: Never Used     Comment: 1 Cigarettes every other Day  . Alcohol use Yes     Comment: daily use-1 1/2 pints cognac  . Drug use: No  . Sexual activity: No   Other Topics Concern  . None   Social History Narrative   Fun: Travel and shopping   Denies religious beliefs effecting healthcare.     Additional Social History:    Sleep: improving   Appetite:  Good  Current Medications: Current Facility-Administered Medications  Medication Dose Route Frequency Provider Last Rate Last Dose  . acetaminophen (TYLENOL) tablet 650 mg  650 mg Oral Q6H PRN [name], NP   650 mg at 02/18/17 1503  . alum & mag hydroxide-simeth (MAALOX/MYLANTA) 200-200-20 MG/5ML suspension 30 mL  30 mL Oral Q4H PRN [name], NP      . escitalopram (LEXAPRO) tablet 5 mg  5 mg Oral Daily [name], [name], MD   5 mg at 02/20/17 6546  . ferrous sulfate tablet 325 mg  325 mg Oral Q breakfast [name], [name], MD   325 mg at 02/20/17 0826  . gabapentin (NEURONTIN) capsule 100 mg  100 mg Oral TID [name], MD   100 mg at 02/20/17 0826  . ibuprofen (ADVIL,MOTRIN) tablet 600 mg  600 mg Oral Q6H PRN [name], NP   600 mg at 02/20/17 5035  . magnesium hydroxide (MILK OF MAGNESIA) suspension 30 mL  30 mL Oral Daily PRN [name], NP      . multivitamin with minerals tablet 1 tablet  1 tablet Oral Daily [name], NP   1 tablet at 02/20/17 [phone]  . ondansetron (ZOFRAN) tablet 8 mg  8 mg Oral Q8H PRN [name], NP   8 mg at 02/19/17 0831  . pantoprazole (PROTONIX) EC tablet 40 mg  40 mg Oral Daily [name], NP   40 mg at 02/20/17 8127  . QUEtiapine (SEROQUEL) tablet 25 mg  25 mg Oral QHS [name], [name], MD   25 mg at 02/19/17 2332  . thiamine (VITAMIN B-1) tablet 100 mg  100 mg Oral Daily [name], NP   100 mg at 02/20/17 5170  . white petrolatum (VASELINE) gel   Topical PRN [name], NP        Lab Results:  Results for orders placed or performed during the hospital encounter of 02/14/17 (from the past 48 hour(s))  CBC with Differential/Platelet     Status: Abnormal   Collection Time: 02/20/17  6:20 AM  Result Value Ref Range   WBC 7.2 4.0 - 10.5 K/uL   RBC 3.50 (L) 3.87 - 5.11 MIL/uL   Hemoglobin 9.3 (L) 12.0 - 15.0 g/dL   HCT 28.8 (L) 36.0 -  46.0 %   MCV 82.3 78.0 - 100.0 fL   MCH 26.6 26.0 - 34.0 pg   MCHC 32.3 30.0 - 36.0 g/dL   RDW 18.3 (H) 11.5 - 15.5 %   Platelets 363 150 - 400 K/uL   Neutrophils Relative % 44 %   Neutro Abs 3.2 1.7 - 7.7 K/uL   Lymphocytes Relative 39 %   Lymphs Abs 2.8 0.7 -  4.0 K/uL   Monocytes Relative 12 %   Monocytes Absolute 0.8 0.1 - 1.0 K/uL   Eosinophils Relative 4 %   Eosinophils Absolute 0.3 0.0 - 0.7 K/uL   Basophils Relative 1 %   Basophils Absolute 0.1 0.0 - 0.1 K/uL    Comment: Performed at [facility], [place] [address] [place], [place] [address]  Basic metabolic panel     Status: None   Collection Time: 02/20/17  6:20 AM  Result Value Ref Range   Sodium 139 135 - 145 mmol/L   Potassium 3.8 3.5 - 5.1 mmol/L   Chloride 109 101 - 111 mmol/L   CO2 22 22 - 32 mmol/L   Glucose, Bld 93 65 - 99 mg/dL   BUN 9 6 - 20 mg/dL   Creatinine, Ser 0.47 0.44 - 1.00 mg/dL   Calcium 9.0 8.9 - 10.3 mg/dL   GFR calc non Af Amer >60 >60 mL/min   GFR calc Af Amer >60 >60 mL/min    Comment: (NOTE) The eGFR has been calculated using the CKD EPI equation. This calculation has not been validated in all clinical situations. eGFR's persistently <60 mL/min signify possible Chronic Kidney Disease.    Anion gap 8 5 - 15    Comment: Performed at [facility], [place] [address], [place], [place] [address]    Blood Alcohol level:  Lab Results  Component Value Date   ETH [facility]) 02/12/2017   ETH <5 78/24/2353    Metabolic Disorder Labs: No results found for: HGBA1C, MPG No results found for: PROLACTIN Lab Results  Component Value Date   CHOL 167 10/31/2014   TRIG 62.0 10/31/2014   HDL 86.90 10/31/2014   CHOLHDL 2 10/31/2014   VLDL 12.4 10/31/2014   LDLCALC 68 10/31/2014   LDLCALC 100 (H) 08/20/2011    Physical Findings: AIMS: Facial and Oral Movements Muscles of Facial Expression: None, normal Lips and Perioral Area: None, normal Jaw: None,  normal Tongue: None, normal,Extremity Movements Upper (arms, wrists, hands, fingers): None, normal Lower (legs, knees, ankles, toes): None, normal, Trunk Movements Neck, shoulders, hips: None, normal, Overall Severity Severity of abnormal movements (highest score from questions above): None, normal Incapacitation due to abnormal movements: None, normal Patient's awareness of abnormal movements (rate only patient's report): No Awareness, Dental Status Current problems with teeth and/or dentures?: No Does patient usually wear dentures?: No  CIWA:  CIWA-Ar Total: 0 COWS:     Musculoskeletal: Strength & Muscle Tone: within normal limits Gait & Station: unsteady Patient leans: N/A  Psychiatric Specialty Exam: Physical Exam  Constitutional: No distress.  HENT:  Head: Normocephalic and atraumatic.  Respiratory: Effort normal.  Neurological: She is alert.  Skin: Skin is warm and dry. She is not diaphoretic.  Psychiatric:  As above    ROS  No current vomiting, (+) nausea, intermittent dizziness, no hematemesis, no melenas   Blood pressure 138/86, pulse (!) 116, temperature 99.4 F (37.4 C), temperature source Oral, resp. rate 16, height '5\' 4"'  (1.626 m), weight 72.1 kg (159 lb), SpO2 100 %.Body mass index is 27.29 kg/m.  General Appearance: fairly groomed   Eye Contact:  Good  Speech: Normal   Volume:  Normal  Mood:  Depressed, some improvement compared to admission  Affect:  Constricted but improving , smiles briefly at times    Thought Process:  Linear and Descriptions of Associations: Intact  Orientation:  Full (Time, Place, and Person)  Thought Content: denies hallucinations, no delusions, not  internally preoccupied   Suicidal Thoughts:  No denies any active suicidal or self injurious ideations  Homicidal Thoughts:  No  Memory:   Recent and remote grossly intact   Judgement:  Other:  improving   Insight:  Improving   Psychomotor Activity:  improving   Concentration:   Concentration: Good and Attention Span: Good  Recall:  Good  Fund of Knowledge:  Good  Language:  Good  Akathisia:  Negative  Handed:    AIMS (if indicated):     Assets:  Communication Skills Desire for Improvement  ADL's:  Intact  Cognition:  WNL  Sleep:  Number of Hours: 6.25    Assessment - patient is presenting with partial improvement but remains depressed, with constricted affect, although more reactive than on admission. Currently future oriented, wanting to go to back to [place], [place] at discharge, and interested in a rehab setting.  She has complained of intermittent dizziness, and as discussed this symptom could be related to Seroquel . Anemia stable, Hgb improving to 9.3 today. Gait presents steadier at this time. We decided to stop Seroquel as could be contributing to dizziness. Vistaril PRNs for anxiety, insomnia  ( she feels that by decreasing anxiety she may be able to sleep better )   Plan  Treatment plan reviewed as below today 8/31 Encourage ongoing group and milieu participation to work on coping skills and symptoms reduction Encourage efforts to work on sobriety and relapse prevention  Treatment team working on discharge planning options- as noted, patient wanting to go to a rehab in [place] at discharge Increase Lexapro to 10 mgrs QDAY for depression, anxiety, side effects reviewed  D/C Seroquel , see rationale above  Start Vistaril 25 mgrs Q 6 hours PRN for anxiety or insomnia Continue Neurontin 100 mgrs TID for anxiety, pain We have discussed vital importance of continuing to follow with PCP and with GI Specialist after D/C for ongoing work up and management .       Jenne Campus, MD 02/20/2017, 9:24 AM   Patient ID: Shannon Cervantes, female   DOB: 12-10-1970, 46 y.o.   MRN: [id number]

## 2017-02-20 NOTE — Tx Team (Signed)
Interdisciplinary Treatment and Diagnostic Plan Update  02/20/2017 Time of Session: [name] MRN: [id number]  Principal Diagnosis: Major depressive disorder, recurrent severe without psychotic features ([name])  Secondary Diagnoses: Principal Problem:   Major depressive disorder, recurrent severe without psychotic features ([name]) Active Problems:   Low hemoglobin   Gastric cancer (HCC)   Alcohol use disorder, moderate, dependence ([name])   Current Medications:  Current Facility-Administered Medications  Medication Dose Route Frequency Provider Last Rate Last Dose  . acetaminophen (TYLENOL) tablet 650 mg  650 mg Oral Q6H PRN [name], NP   650 mg at 02/18/17 1503  . alum & mag hydroxide-simeth (MAALOX/MYLANTA) 200-200-20 MG/5ML suspension 30 mL  30 mL Oral Q4H PRN [name], NP      . escitalopram (LEXAPRO) tablet 5 mg  5 mg Oral Daily [name], [name], MD   5 mg at 02/20/17 5053  . ferrous sulfate tablet 325 mg  325 mg Oral Q breakfast [name], [name], MD   325 mg at 02/20/17 0826  . gabapentin (NEURONTIN) capsule 100 mg  100 mg Oral TID [name], MD   100 mg at 02/20/17 0826  . ibuprofen (ADVIL,MOTRIN) tablet 600 mg  600 mg Oral Q6H PRN [name], NP   600 mg at 02/20/17 9767  . magnesium hydroxide (MILK OF MAGNESIA) suspension 30 mL  30 mL Oral Daily PRN [name], NP      . multivitamin with minerals tablet 1 tablet  1 tablet Oral Daily [name], NP   1 tablet at 02/20/17 [phone]  . ondansetron (ZOFRAN) tablet 8 mg  8 mg Oral Q8H PRN [name], NP   8 mg at 02/20/17 0948  . pantoprazole (PROTONIX) EC tablet 40 mg  40 mg Oral Daily [name], NP   40 mg at 02/20/17 3790  . QUEtiapine (SEROQUEL) tablet 25 mg  25 mg Oral QHS [name], [name], MD   25 mg at 02/19/17 2332  . thiamine (VITAMIN B-1) tablet 100 mg  100 mg Oral Daily [name], NP   100 mg at 02/20/17 2409  . white petrolatum (VASELINE) gel   Topical PRN [name], NP       PTA Medications: Prescriptions Prior to Admission  Medication Sig Dispense Refill Last Dose  . acetaminophen (TYLENOL) 500 MG tablet Take 1,000 mg by mouth every 6 (six) hours as needed for moderate pain.   Past Week at Unknown time  . buPROPion (WELLBUTRIN XL) 150 MG 24 hr tablet Take 150 mg by mouth daily.   Past Month at Unknown time  . buPROPion (WELLBUTRIN XL) 300 MG 24 hr tablet Take 1 tablet (300 mg total) by mouth daily. (Patient not taking: Reported on 06/23/2015) 30 tablet 0 Not Taking at Unknown time  . chlordiazePOXIDE (LIBRIUM) 25 MG capsule 50mg  PO TID x 1D, then 25-50mg  PO BID X 1D, then 25-50mg  PO QD X 1D (Patient not taking: Reported on 02/13/2017) 10 capsule 0 Not Taking at Unknown time  . ibuprofen (ADVIL,MOTRIN) 200 MG tablet Take 800 mg by mouth every 6 (six) hours as needed for fever, headache, mild pain, moderate pain or cramping.    Past Week at Unknown time  . lisinopril (PRINIVIL,ZESTRIL) 5 MG tablet Take 1 tablet (5 mg total) by mouth daily. (Patient not taking: Reported on 02/13/2017) 30 tablet 0 Not Taking at Unknown time  . pantoprazole (PROTONIX) 40 MG tablet Take 1 tablet (  40 mg total) by mouth daily. (Patient not taking: Reported on 02/13/2017) 30 tablet 1 Not Taking at Unknown time  . potassium chloride SA (K-DUR,KLOR-CON) 20 MEQ tablet Take 1 tablet (20 mEq total) by mouth daily. (Patient not taking: Reported on 06/23/2015) 5 tablet 0 Not Taking at Unknown time    Patient Stressors: Financial difficulties Health problems Substance abuse  Patient Strengths: Capable of independent living Curator fund of knowledge Motivation for treatment/growth  Treatment Modalities: Medication Management, Group therapy, Case management,  1 to 1 session with clinician, Psychoeducation, Recreational therapy.   Physician Treatment Plan for Primary Diagnosis: Major depressive disorder, recurrent severe without psychotic features  ([place]) Long Term Goal(s): Improvement in symptoms so as ready for discharge Improvement in symptoms so as ready for discharge   Short Term Goals: Ability to verbalize feelings will improve Ability to disclose and discuss suicidal ideas Ability to identify and develop effective coping behaviors will improve Compliance with prescribed medications will improve  Medication Management: Evaluate patient's response, side effects, and tolerance of medication regimen.  Therapeutic Interventions: 1 to 1 sessions, Unit Group sessions and Medication administration.  Evaluation of Outcomes: Progressing  Physician Treatment Plan for Secondary Diagnosis: Principal Problem:   Major depressive disorder, recurrent severe without psychotic features ([name]) Active Problems:   Low hemoglobin   Gastric cancer (HCC)   Alcohol use disorder, moderate, dependence ([place])  Long Term Goal(s): Improvement in symptoms so as ready for discharge Improvement in symptoms so as ready for discharge   Short Term Goals: Ability to verbalize feelings will improve Ability to disclose and discuss suicidal ideas Ability to identify and develop effective coping behaviors will improve Compliance with prescribed medications will improve     Medication Management: Evaluate patient's response, side effects, and tolerance of medication regimen.  Therapeutic Interventions: 1 to 1 sessions, Unit Group sessions and Medication administration.  Evaluation of Outcomes: Progressing   RN Treatment Plan for Primary Diagnosis: Major depressive disorder, recurrent severe without psychotic features ([name]) Long Term Goal(s): Knowledge of disease and therapeutic regimen to maintain health will improve  Short Term Goals: Ability to remain free from injury will improve, Ability to demonstrate self-control and Ability to disclose and discuss suicidal ideas  Medication Management: RN will administer medications as ordered by provider, will  assess and evaluate patient's response and provide education to patient for prescribed medication. RN will report any adverse and/or side effects to prescribing provider.  Therapeutic Interventions: 1 on 1 counseling sessions, Psychoeducation, Medication administration, Evaluate responses to treatment, Monitor vital signs and CBGs as ordered, Perform/monitor CIWA, COWS, AIMS and Fall Risk screenings as ordered, Perform wound care treatments as ordered.  Evaluation of Outcomes: Progressing   LCSW Treatment Plan for Primary Diagnosis: Major depressive disorder, recurrent severe without psychotic features ([place]) Long Term Goal(s): Safe transition to appropriate next level of care at discharge, Engage patient in therapeutic group addressing interpersonal concerns.  Short Term Goals: Engage patient in aftercare planning with referrals and resources, Facilitate patient progression through stages of change regarding substance use diagnoses and concerns and Identify triggers associated with mental health/substance abuse issues  Therapeutic Interventions: Assess for all discharge needs, 1 to 1 time with Social worker, Explore available resources and support systems, Assess for adequacy in community support network, Educate family and significant other(s) on suicide prevention, Complete Psychosocial Assessment, Interpersonal group therapy.  Evaluation of Outcomes: Progressing   Progress in Treatment: Attending groups: Yes. Participating in groups: Yes. Taking medication as prescribed: Yes. Toleration  medication: Yes. Family/Significant other contact made: Yes with brother Patient understands diagnosis: Yes. Discussing patient identified problems/goals with staff: Yes. Medical problems stabilized or resolved: Yes. Denies suicidal/homicidal ideation: Yes. Issues/concerns per patient self-inventory: No. Other: n/a   New problem(s) identified: Yes, Describe:  pt has possible stomach cancer diagnosis  and will likely need to get treated for this before entering substance abuse treatment program.    8/31: No cancer diagnosis; has had endoscopy at another facility and no cancer diagnosis was given. Is requesting residential treatment in [place], [place] Term/Long Term Goal(s): alcohol detox; medication management for mood stabilization; development of comprehensive mental wellness/sobriety plan.   Patient Goal: "to get more energy and get my medications fixed."   Discharge Plan or Barriers: CSW assessing for appropriate referrals[facility] for outpatient treatment. Pt provideed with inpatient options for after her medical issues have been treated. [place] and AA/NA lists also provided to pt for additional community support.  8/31: Pt now requesting residential treatment in [facility]; CSW assessing for appropriate referrals   Reason for Continuation of Hospitalization: Anxiety Depression Medication stabilization Withdrawal symptoms  Estimated Length of Stay: 1-3 days; est DC date 9/1  Attendees: Patient: Shannon Cervantes  02/20/2017 11:46 AM  Physician: Dr. Parke Poisson MD 02/20/2017 11:46 AM  Nursing: Linna Hoff RN 02/20/2017 11:46 AM  RN Care Manager: 02/20/2017 11:46 AM  Social Worker: Adriana Reams, LCSW 02/20/2017 11:46 AM  Recreational Therapist: x 02/20/2017 11:46 AM  Other:  02/20/2017 11:46 AM  Other:  02/20/2017 11:46 AM  Other: 02/20/2017 11:46 AM    Scribe for Treatment Team: Gladstone Lighter, LCSW 02/20/2017 11:46 AM

## 2017-02-20 NOTE — Progress Notes (Signed)
Recreation Therapy Notes  Date: 02/20/17 Time: 0930 Location: [address] Dayroom  Group Topic: Stress Management  Goal Area(s) Addresses:  Patient will verbalize importance of using healthy stress management.  Patient will identify positive emotions associated with healthy stress management.   Behavioral Response: Engaged  Intervention: Stress Management  Activity :  Progressive Muscle Relaxation.  LRT introduced the stress management technique of progressive muscle relaxation.  Patients were to follow along as LRT read script to fully engage in the technique.  Education:  Stress Management, Discharge Planning.   Education Outcome: Acknowledges edcuation/In group clarification offered/Needs additional education  Clinical Observations/Feedback: Pt attended group.   Victorino Sparrow, LRT/CTRS         Victorino Sparrow A 02/20/2017 12:07 PM

## 2017-02-20 NOTE — Progress Notes (Signed)
Nursing Note 02/20/2017 [id number]  Data Reports sleeping poor with PRN sleep med.  Rates depression 10/10, hopelessness 10/10, and anxiety 10/10. Affect sullen, depressed. Denies HI, SI, AVH.  Reports feeling dizzy today, as if the room is moving.  BP/Pulse not orthostatic. MD aware.  Had some nausea this afternoon.  C/O ongoing foot pain.  Attending groups, polite and appropriate.  Action Spoke with patient 1:1, nurse offered support to patient throughout shift.  PRN for nausea, PRNs for pain.  Continues to be monitored on 15 minute checks for safety.  Response PRN's effective.  Remains safe on unit. Continues to have dizziness but vitals stable.

## 2017-02-21 DIAGNOSIS — G4700 Insomnia, unspecified: Secondary | ICD-10-CM

## 2017-02-21 DIAGNOSIS — F1910 Other psychoactive substance abuse, uncomplicated: Secondary | ICD-10-CM

## 2017-02-21 DIAGNOSIS — R450 Nervousness: Secondary | ICD-10-CM

## 2017-02-21 MED ORDER — ESCITALOPRAM OXALATE 5 MG PO TABS
5.0000 mg | ORAL_TABLET | Freq: Every day | ORAL | Status: DC
  Administered 2017-02-23 – 2017-02-24 (×2): 5 mg via ORAL
  Filled 2017-02-21 (×5): qty 1

## 2017-02-21 NOTE — Progress Notes (Addendum)
D: Pt presents with a flat affect and anxious mood. Pt appears preoccupied and fixated on her medications. Pt stated that the Seroquel was discontinued because because it was making her head feel weird. Pt then refused to take Neurontin stating that after she took it yesterday afternoon she felt weird. Pt reports feeling "weak today from the medications". Pt appears to be somatic. After taking meds this morning, pt ask writer to check b/p due to possible panic attack. B/p 120/80 P-92. Pt then asked for Zofran stating that she felt nauseated.  A: Medications reviewed with pt. Medications administered as ordered per MD. Verbal support provided. Pt encouraged to attend groups. 15 minute checks performed for safety. R: Pt hesitant to taking meds.   Pt presented to the nurses station around 1300 and reported that she "feels bad" and believe that it is due to taking an increase dose of Lexapro this morning. Pt stated that she started feeling anxious, nauseous along with a headache after taking Lexapro this morning.

## 2017-02-21 NOTE — [facility] Group Notes (Signed)
LCSW Group Therapy Note  02/21/2017     10:00-11:00AM  Type of Therapy and Topic:  Group Therapy:  Decisional Balance/Substance Use  Participation Level:  Active        . Description of Group:  The main focus of today's process group was learning how to use a decisional balance exercise to make a decision about whether to change an unhealthy coping skill, as well as how to use the information gathered in the actual process of planning that change.  Patients listed some of their most frequently utilized unhealthy coping techniques and CSW pointed out the similarities.  Motivational Interviewing and the whiteboard were utilized to help patients explore in-depth the perceived benefits and costs of a specific, shared unhealthy coping technique (drinking & drugging) as well as the benefits and costs of replacing that with other, healthy coping skills.  A handout was distributed for patients to be able to do this exercise for themselves.     Therapeutic Goals 1. Patient will be able to utilize the decision balance exercise on their own 2. Patient will list coping skills they use to fulfill their needs 3. Patient will identify the differences between healthy and  unhealthy coping skills 4. Patient will verbalize the costs and benefits of drinking/drugging versus making the choice to change 5. Patient will learn how to use the exercise to identify the most important supports to put in place so that they can succeed in a change to which they commit  Summary of Patient Progress: During group, patient expressed that she tends to overthink everything.  She stated that she only started drinking about 4 years ago as a coping technique, and it is out of control.  She talked about how her family does not understand how she can "let" this happen.  She was given suggestions for how to combat her reluctance to ask for help.   Therapeutic Modalities Cognitive Behavioral Therapy Motivational  Interviewing   Maretta Los, LCSW 02/21/2017 1:12 PM

## 2017-02-21 NOTE — Progress Notes (Signed)
D: Pt denies SI/HI/AVH. Pt is pleasant and cooperative. Pt stated she had a bad day and felt dizzy all day long so pt was not in the best mood . Pt was very irritable earlier in the evening, but would talk with writer very appropriately. Pt did state she was able to get some insight into her Tx , so pt appeared to feel like things were going to start getting better.   A: Pt was offered support and encouragement. Pt was given scheduled medications. Pt was encourage to attend groups. Q 15 minute checks were done for safety.   R:Pt attends groups and interacts well with peers and staff. Pt is taking medication. Pt receptive to treatment and safety maintained on unit.

## 2017-02-21 NOTE — Progress Notes (Signed)
Pt reminded of stool sample for blood occult. Pt stated that she had a BM yesterday but didn't remember to notify nurse.

## 2017-02-21 NOTE — [facility] Group Notes (Signed)
Identifying Needs   Date:  02/21/2017  Time:  1300  Type of Therapy:  Nurse Education  /  The group focuses on teaching patients how to identify their needs and then how to develop skills needs to get them met.  Participation Level:  Did Not Attend  Participation Quality:    Affect:    Cognitive:    Insight:    Engagement in Group:    Modes of Intervention:    Summary of Progress/Problems:  Lauralyn Primes 02/21/2017, 2:39 PM

## 2017-02-21 NOTE — Progress Notes (Signed)
[facility] MD Progress Note  02/21/2017 3:38 PM Shannon Cervantes  MRN:  [id number]    Subjective: Shannon Cervantes reports "  I think this Lexapro is making me nauseated."    Objective:  Shannon Cervantes is awake, alert and oriented. Seen resting in dayroom interacting with her peers. Patient reports she was prescribed Lexapro, Neurontin and Seroquel as is unsure which medication is causing her to fell uneasy. Patient reports she I sensitive to medications. States she is very reluctant to take any more meds. Denies suicidal or homicidal ideation. Denies auditory or visual hallucination and does not appear to be responding to internal stimuli. Patient reports mild depression. Reports she has a good appetite and states she is resting okay. Support, encouragement and reassurance was provided.    Principal Problem: Major depressive disorder, recurrent severe without psychotic features ([place]) Diagnosis:   Patient Active Problem List   Diagnosis Date Noted  . Low hemoglobin [D64.9] 02/15/2017  . Gastric cancer ([place]) [C16.9] 02/15/2017  . Alcohol use disorder, moderate, dependence ([place]) [F10.20] 02/15/2017  . Viral gastroenteritis [A08.4] 01/11/2015  . Needle stick injury of finger [S61.239A, W27.3XXA] 12/11/2014  . Essential hypertension [I10] 12/11/2014  . Cough [R05] 12/11/2014  . Major depressive disorder, recurrent severe without psychotic features ([place]) [F33.2] 11/13/2014  . PTSD (post-traumatic stress disorder) [F43.10] 11/13/2014  . Alcohol use disorder, severe, dependence ([place]) [F10.20] 11/12/2014  . Suicidal ideation [R45.851] 11/12/2014  . 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 Chest pain [R07.9] 11/02/2013  . Obesity [E66.9] 08/21/2011  . Hypertensive cardiomyopathy ([place]) [I11.9, I43] 08/20/2011   Total Time spent with patient: 20 minutes  Past Psychiatric History: As in H&P  Past Medical History:  Past Medical History:  Diagnosis Date  . Alcohol abuse   . Anxiety   .  Depression   . Foot fracture 01-07-2011  . Hyperlipidemia   . Hypertensive cardiomyopathy ([place])     Past Surgical History:  Procedure Laterality Date  . CESAREAN SECTION    . GASTRIC BYPASS     Family History:  Family History  Problem Relation Age of Onset  . Hypertension Mother   . Heart disease Mother   . Hyperlipidemia Mother   . Depression Mother   . Colon cancer Mother   . Heart disease Father   . Depression Father   . Renal Disease Father   . Alcohol abuse Brother   . Alcohol abuse Paternal Uncle    Family Psychiatric  History: As in H&P Social History:  History  Alcohol Use  . Yes    Comment: daily use-1 1/2 pints cognac     History  Drug Use No    Social History   Social History  . Marital status: Divorced    Spouse name: N/A  . Number of children: 4  . Years of education: 25   Occupational History  . RN    Social History Main Topics  . Smoking status: Former Smoker    Packs/day: 0.01    Types: Cigarettes  . Smokeless tobacco: Never Used     Comment: 1 Cigarettes every other Day  . Alcohol use Yes     Comment: daily use-1 1/2 pints cognac  . Drug use: No  . Sexual activity: No   Other Topics Concern  . None   Social History Narrative   Fun: Travel and shopping   Denies religious beliefs effecting healthcare.    Additional Social History:    Sleep: improving   Appetite:  Good  Current Medications: Current Facility-Administered Medications  Medication Dose Route Frequency Provider Last Rate Last Dose  . acetaminophen (TYLENOL) tablet 650 mg  650 mg Oral Q6H PRN [name], NP   650 mg at 02/21/17 1301  . alum & mag hydroxide-simeth (MAALOX/MYLANTA) 200-200-20 MG/5ML suspension 30 mL  30 mL Oral Q4H PRN [name], NP      . escitalopram (LEXAPRO) tablet 10 mg  10 mg Oral Daily [name], [name], MD   10 mg at 02/21/17 0814  . ferrous sulfate tablet 325 mg  325 mg Oral Q breakfast [name], [name], MD   325 mg at 02/21/17 0814   . gabapentin (NEURONTIN) capsule 100 mg  100 mg Oral TID [name], MD   100 mg at 02/20/17 1217  . hydrOXYzine (ATARAX/VISTARIL) tablet 25 mg  25 mg Oral Q6H PRN [name], [name], MD   25 mg at 02/20/17 2338  . ibuprofen (ADVIL,MOTRIN) tablet 600 mg  600 mg Oral Q6H PRN [name], NP   600 mg at 02/21/17 0821  . magnesium hydroxide (MILK OF MAGNESIA) suspension 30 mL  30 mL Oral Daily PRN [name], NP      . multivitamin with minerals tablet 1 tablet  1 tablet Oral Daily [name], NP   1 tablet at 02/21/17 0814  . ondansetron (ZOFRAN) tablet 8 mg  8 mg Oral Q8H PRN [name], NP   8 mg at 02/21/17 0940  . pantoprazole (PROTONIX) EC tablet 40 mg  40 mg Oral Daily [name], NP   40 mg at 02/21/17 9629  . thiamine (VITAMIN B-1) tablet 100 mg  100 mg Oral Daily [name], NP   100 mg at 02/21/17 0813  . white petrolatum (VASELINE) gel   Topical PRN [name], NP        Lab Results:  Results for orders placed or performed during the hospital encounter of 02/14/17 (from the past 48 hour(s))  CBC with Differential/Platelet     Status: Abnormal   Collection Time: 02/20/17  6:20 AM  Result Value Ref Range   WBC 7.2 4.0 - 10.5 K/uL   RBC 3.50 (L) 3.87 - 5.11 MIL/uL   Hemoglobin 9.3 (L) 12.0 - 15.0 g/dL   HCT 28.8 (L) 36.0 - 46.0 %   MCV 82.3 78.0 - 100.0 fL   MCH 26.6 26.0 - 34.0 pg   MCHC 32.3 30.0 - 36.0 g/dL   RDW 18.3 (H) 11.5 - 15.5 %   Platelets 363 150 - 400 K/uL   Neutrophils Relative % 44 %   Neutro Abs 3.2 1.7 - 7.7 K/uL   Lymphocytes Relative 39 %   Lymphs Abs 2.8 0.7 - 4.0 K/uL   Monocytes Relative 12 %   Monocytes Absolute 0.8 0.1 - 1.0 K/uL   Eosinophils Relative 4 %   Eosinophils Absolute 0.3 0.0 - 0.7 K/uL   Basophils Relative 1 %   Basophils Absolute 0.1 0.0 - 0.1 K/uL    Comment: Performed at [facility], [place] [address], [place], [place] [address]  Basic metabolic panel     Status: None    Collection Time: 02/20/17  6:20 AM  Result Value Ref Range   Sodium 139 135 - 145 mmol/L   Potassium 3.8 3.5 - 5.1 mmol/L   Chloride 109 101 - 111 mmol/L   CO2 22 22 - 32 mmol/L   Glucose, Bld 93  65 - 99 mg/dL   BUN 9 6 - 20 mg/dL   Creatinine, Ser 0.47 0.44 - 1.00 mg/dL   Calcium 9.0 8.9 - 10.3 mg/dL   GFR calc non Af Amer >60 >60 mL/min   GFR calc Af Amer >60 >60 mL/min    Comment: (NOTE) The eGFR has been calculated using the CKD EPI equation. This calculation has not been validated in all clinical situations. eGFR's persistently <60 mL/min signify possible Chronic Kidney Disease.    Anion gap 8 5 - 15    Comment: Performed at [facility], [place] [address] [place], [place] [address]    Blood Alcohol level:  Lab Results  Component Value Date   ETH 368 [facility]) 02/12/2017   ETH <5 87/68/1157    Metabolic Disorder Labs: No results found for: HGBA1C, MPG No results found for: PROLACTIN Lab Results  Component Value Date   CHOL 167 10/31/2014   TRIG 62.0 10/31/2014   HDL 86.90 10/31/2014   CHOLHDL 2 10/31/2014   VLDL 12.4 10/31/2014   LDLCALC 68 10/31/2014   LDLCALC 100 (H) 08/20/2011    Physical Findings: AIMS: Facial and Oral Movements Muscles of Facial Expression: None, normal Lips and Perioral Area: None, normal Jaw: None, normal Tongue: None, normal,Extremity Movements Upper (arms, wrists, hands, fingers): None, normal Lower (legs, knees, ankles, toes): None, normal, Trunk Movements Neck, shoulders, hips: None, normal, Overall Severity Severity of abnormal movements (highest score from questions above): None, normal Incapacitation due to abnormal movements: None, normal Patient's awareness of abnormal movements (rate only patient's report): No Awareness, Dental Status Current problems with teeth and/or dentures?: No Does patient usually wear dentures?: No  CIWA:  CIWA-Ar Total: 0 COWS:     Musculoskeletal: Strength & Muscle Tone: within  normal limits Gait & Station: unsteady Patient leans: N/A  Psychiatric Specialty Exam: Physical Exam  Constitutional: No distress.  HENT:  Head: Normocephalic and atraumatic.  Respiratory: Effort normal.  Neurological: She is alert.  Skin: Skin is warm and dry. She is not diaphoretic.  Psychiatric:  As above    Review of Systems  Psychiatric/Behavioral: Positive for depression and substance abuse. The patient is nervous/anxious.     No current vomiting, (+) nausea, intermittent dizziness, no hematemesis, no melenas   Blood pressure 109/62, pulse 94, temperature 99 F (37.2 C), temperature source Oral, resp. rate 18, height 5' 4" (1.626 m), weight 72.1 kg (159 lb), SpO2 100 %.Body mass index is 27.29 kg/m.  General Appearance: casual and guarded  Eye Contact:  Good  Speech: Normal   Volume:  Normal  Mood:  Depressed, flat and guarded  Affect:  Constricted but improving , smiles briefly at times    Thought Process:  Linear and Descriptions of Associations: Intact  Orientation:  Full (Time, Place, and Person)  Thought Content: denies hallucinations, no delusions, not internally preoccupied   Suicidal Thoughts:  No   Homicidal Thoughts:  No  Memory:   Recent and remote grossly intact   Judgement:  Other:  improving   Insight:  Improving   Psychomotor Activity:  improving   Concentration:  Concentration: Good and Attention Span: Good  Recall:  Good  Fund of Knowledge:  Good  Language:  Good  Akathisia:  Negative  Handed:    AIMS (if indicated):     Assets:  Communication Skills Desire for Improvement  ADL's:  Intact  Cognition:  WNL  Sleep:  Number of Hours: 6.25     I agree with  current treatment plan on 02/21/2017, Patient seen face-to-face for psychiatric evaluation follow-up, chart reviewed. Reviewed the information documented and agree with the treatment plan.  Treatment Plan Summary: Daily contact with patient to assess and evaluate symptoms and progress in  treatment and Medication management  Continue with current treatment plan on 02/21/2017 except where noted  Encourage ongoing group and milieu participation to work on coping skills and symptoms reduction Encourage efforts to work on sobriety and relapse prevention  Treatment team working on discharge planning options- as noted, patient wanting to go to a rehab in [place] at discharge Decreased Lexapro to 10 mg to 5 mg  QDAY for depression, anxiety, side effects reviewed  D/C Seroquel , see rationale above  Continue Vistaril 25 mgrs Q 6 hours PRN for anxiety or insomnia Continue Neurontin 100 mgrs TID for anxiety, pain  We have discussed vital importance of continuing to follow with PCP and with GI Specialist after D/C for ongoing work up and management .   Derrill [place], NP 02/21/2017, 3:38 PM

## 2017-02-21 NOTE — Progress Notes (Signed)
The patient attended the A.A.meeting on the 300 hallway this evening.  

## 2017-02-21 NOTE — Progress Notes (Signed)
Writer spoke with patient 1:1 and she reports that she has had an okay day. She reports that she has had problems with the medications she has been taking which have caused her to feel nauseous. She plans to discharge on Monday with her family from [place] coming to pick her up. She attended [place] meeting on [place]. Support given and safety maintained on unit with 15 min checks.

## 2017-02-21 NOTE — Plan of Care (Signed)
Problem: Coping: Goal: Ability to cope will improve Outcome: Progressing Pt stated her day started off rough , but pt said she began to feel better about her situation after talking to a staff member

## 2017-02-22 MED ORDER — METHOCARBAMOL 500 MG PO TABS
500.0000 mg | ORAL_TABLET | Freq: Three times a day (TID) | ORAL | Status: DC | PRN
  Administered 2017-02-22 – 2017-02-25 (×6): 500 mg via ORAL
  Filled 2017-02-22 (×6): qty 1

## 2017-02-22 NOTE — Progress Notes (Signed)
D: Pt A & O X4. Denies SI, HI and AVH when assessed. Presents with flat affect and depressed mood; guarded but is pleasant and forwards during interactions. Pt was able to discuss stressors that led to her depression / decompensation ("moving away from home without support, I never thought everything through before moving to [place] with my son") with Probation officer. Pt is selectively compliant with medications when offered. Pt's safety boot intact to right foot when ambulating due to fracture. A: Support and encouragement provided to pt. PRN and scheduled medications administered as per MD's orders and effects monitored. Warm compress offered at intervals during shift for right foot pain. Routine safety checks remains effective without self harm gestures or outburst.  R: Pt receptive to care. Remains medication compliant. Tolerates all PO intake well. POC effective for safety and mood stability.

## 2017-02-22 NOTE — Plan of Care (Signed)
Problem: Medication: Goal: Compliance with prescribed medication regimen will improve Outcome: Progressing Pt selectively took her medications with encouragement this shift. Denies adverse drug reactions at this time.   Problem: Safety: Goal: Ability to disclose and discuss suicidal ideas will improve Outcome: Progressing Pt denies SI when assessed. Verbally contracts for safety. However, pt was able to discuss stressors that led to her depression ("moving away from home without support, I never thought everything through before moving to [place] with my son").

## 2017-02-22 NOTE — [facility] Group Notes (Signed)
   Date:  02/22/2017  Time:  1300 Type of Therapy:  Nurse Education   The group was focused on  teaching patients how to identify unhelathy coping skills and replace them with healthier ones.  Participation Level:  Did Not Attend  Participation Quality:  Pt did not attend.  Affect:  flat  Cognitive:  intact  Insight:  minimql  Engagement in Group:  Engaged  Modes of Intervention:  Discussion  Summary of Progress/Problems:  Lauralyn Primes 02/22/2017, 3:38 PM

## 2017-02-22 NOTE — Progress Notes (Signed)
Patient ID: Shannon Cervantes, female   DOB: [birth year], 46 y.o.   MRN: [id number]  Pt currently presents with a flat affect and anxious behavior. Pt states "I am having a lot of pain in my foot and aches just in general. It was not a good day today." Pt reports "on and off" sleep with current medication regimen. Endorses on going anxiety.  Pt provided with medications per providers orders. Pt's labs and vitals were monitored throughout the night. Pt given a 1:1 about emotional and mental status. Pt supported and encouraged to express concerns and questions. Pt educated on medications. Provider notified of patients physical complaints. Per provider, previous MD suggested patient not receive Ibuprofen for pain. Pt tolerated Tylenol and Robxin well.   Pt's safety ensured with 15 minute and environmental checks. Pt currently denies SI/HI and A/V hallucinations. Pt verbally agrees to seek staff if SI/HI or A/VH occurs and to consult with staff before acting on any harmful thoughts. Pt interacts positively with peers, attends AA group tonight. Will continue POC.

## 2017-02-22 NOTE — Progress Notes (Signed)
[facility] MD Progress Note  02/22/2017 2:42 PM Shannon Cervantes  MRN:  [id number]    Subjective: Shannon Cervantes continues to have concerns with nausea and anxiety with medications.- of note her Lexapro was adjusted back to 5 mg as requested. Shannon Cervantes reports she doesn't feel as if she needs any medications to help her."   Objective:  KAYLYNNE Cervantes Seen standing in the hall interacting with her peers. Patient is ruminative with medications. Per nursing staff has refused her medication today, however still has concerns with nausea and headaches. Np offered to discontinue medications reports she will take the medication later on today after dinner. continues to deny suicidal or homicidal ideation. Denies auditory or visual hallucination and does not appear to be responding to internal stimuli.Patient reports mild depression and reports she feels ready to leave tomorrow with her family. Support, encouragement and reassurance was provided.    Principal Problem: Major depressive disorder, recurrent severe without psychotic features ([place]) Diagnosis:   Patient Active Problem List   Diagnosis Date Noted  . Low hemoglobin [D64.9] 02/15/2017  . Gastric cancer ([place]) [C16.9] 02/15/2017  . Alcohol use disorder, moderate, dependence ([place]) [F10.20] 02/15/2017  . Viral gastroenteritis [A08.4] 01/11/2015  . Needle stick injury of finger [S61.239A, W27.3XXA] 12/11/2014  . Essential hypertension [I10] 12/11/2014  . Cough [R05] 12/11/2014  . Major depressive disorder, recurrent severe without psychotic features ([place]) [F33.2] 11/13/2014  . PTSD (post-traumatic stress disorder) [F43.10] 11/13/2014  . Alcohol use disorder, severe, dependence ([place]) [F10.20] 11/12/2014  . Suicidal ideation [R45.851] 11/12/2014  . 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 Chest pain [R07.9] 11/02/2013  . Obesity [E66.9] 08/21/2011  . Hypertensive cardiomyopathy ([place]) [I11.9, I43] 08/20/2011   Total Time spent with patient: 20  minutes  Past Psychiatric History: As in H&P  Past Medical History:  Past Medical History:  Diagnosis Date  . Alcohol abuse   . Anxiety   . Depression   . Foot fracture 01-07-2011  . Hyperlipidemia   . Hypertensive cardiomyopathy ([place])     Past Surgical History:  Procedure Laterality Date  . CESAREAN SECTION    . GASTRIC BYPASS     Family History:  Family History  Problem Relation Age of Onset  . Hypertension Mother   . Heart disease Mother   . Hyperlipidemia Mother   . Depression Mother   . Colon cancer Mother   . Heart disease Father   . Depression Father   . Renal Disease Father   . Alcohol abuse Brother   . Alcohol abuse Paternal Uncle    Family Psychiatric  History: As in H&P Social History:  History  Alcohol Use  . Yes    Comment: daily use-1 1/2 pints cognac     History  Drug Use No    Social History   Social History  . Marital status: Divorced    Spouse name: N/A  . Number of children: 4  . Years of education: 88   Occupational History  . RN    Social History Main Topics  . Smoking status: Former Smoker    Packs/day: 0.01    Types: Cigarettes  . Smokeless tobacco: Never Used     Comment: 1 Cigarettes every other Day  . Alcohol use Yes     Comment: daily use-1 1/2 pints cognac  . Drug use: No  . Sexual activity: No   Other Topics Concern  . None   Social History Narrative   Fun: Travel and shopping  Denies religious beliefs effecting healthcare.    Additional Social History:    Sleep: improving   Appetite:  Good  Current Medications: Current Facility-Administered Medications  Medication Dose Route Frequency Provider Last Rate Last Dose  . acetaminophen (TYLENOL) tablet 650 mg  650 mg Oral Q6H PRN [name], NP   650 mg at 02/22/17 1014  . alum & mag hydroxide-simeth (MAALOX/MYLANTA) 200-200-20 MG/5ML suspension 30 mL  30 mL Oral Q4H PRN [name], NP      . escitalopram (LEXAPRO) tablet 5 mg  5 mg Oral Daily Ricky Ala N, NP      . ferrous sulfate tablet 325 mg  325 mg Oral Q breakfast [name], [name], MD   325 mg at 02/22/17 0908  . hydrOXYzine (ATARAX/VISTARIL) tablet 25 mg  25 mg Oral Q6H PRN [name], [name], MD   25 mg at 02/22/17 1300  . ibuprofen (ADVIL,MOTRIN) tablet 600 mg  600 mg Oral Q6H PRN [name], NP   600 mg at 02/22/17 0809  . magnesium hydroxide (MILK OF MAGNESIA) suspension 30 mL  30 mL Oral Daily PRN [name], NP      . multivitamin with minerals tablet 1 tablet  1 tablet Oral Daily [name], NP   1 tablet at 02/22/17 0909  . ondansetron (ZOFRAN) tablet 8 mg  8 mg Oral Q8H PRN [name], NP   8 mg at 02/22/17 1014  . pantoprazole (PROTONIX) EC tablet 40 mg  40 mg Oral Daily [name], NP   40 mg at 02/22/17 0910  . thiamine (VITAMIN B-1) tablet 100 mg  100 mg Oral Daily [name], NP   100 mg at 02/22/17 0908  . white petrolatum (VASELINE) gel   Topical PRN [name], NP        Lab Results:  No results found for this or any previous visit (from the past 48 hour(s)).  Blood Alcohol level:  Lab Results  Component Value Date   ETH 368 (HH) 02/12/2017   ETH <5 50/02/3817    Metabolic Disorder Labs: No results found for: HGBA1C, MPG No results found for: PROLACTIN Lab Results  Component Value Date   CHOL 167 10/31/2014   TRIG 62.0 10/31/2014   HDL 86.90 10/31/2014   CHOLHDL 2 10/31/2014   VLDL 12.4 10/31/2014   LDLCALC 68 10/31/2014   LDLCALC 100 (H) 08/20/2011    Physical Findings: AIMS: Facial and Oral Movements Muscles of Facial Expression: None, normal Lips and Perioral Area: None, normal Jaw: None, normal Tongue: None, normal,Extremity Movements Upper (arms, wrists, hands, fingers): None, normal Lower (legs, knees, ankles, toes): None, normal, Trunk Movements Neck, shoulders, hips: None, normal, Overall Severity Severity of abnormal movements (highest score from questions above): None, normal Incapacitation due to  abnormal movements: None, normal Patient's awareness of abnormal movements (rate only patient's report): No Awareness, Dental Status Current problems with teeth and/or dentures?: No Does patient usually wear dentures?: No  CIWA:  CIWA-Ar Total: 0 COWS:     Musculoskeletal: Strength & Muscle Tone: within normal limits Gait & Station: unsteady Patient leans: N/A  Psychiatric Specialty Exam: Physical Exam  Constitutional: She appears well-developed. No distress.  HENT:  Head: Normocephalic and atraumatic.  Respiratory: Effort normal.  Neurological: She is alert.  Skin: Skin is warm and dry. She is not diaphoretic.  Psychiatric:  As above    Review of Systems  Gastrointestinal: Positive for nausea.  Neurological:  Positive for headaches.  Psychiatric/Behavioral: Positive for depression and substance abuse. The patient is nervous/anxious.     No current vomiting, (+) nausea, intermittent dizziness, no hematemesis, no melenas   Blood pressure 111/71, pulse 96, temperature 98.4 F (36.9 C), temperature source Oral, resp. rate 18, height 5\' 4"  (1.626 m), weight 72.1 kg (159 lb), SpO2 100 %.Body mass index is 27.29 kg/m.  General Appearance: casual and guarded  Eye Contact:  Good  Speech: Normal   Volume:  Normal  Mood:  Depressed, flat and guarded  Affect:  Constricted but improving , smiles briefly at times    Thought Process:  Linear and Descriptions of Associations: Intact  Orientation:  Full (Time, Place, and Person)  Thought Content: denies hallucinations.  Suicidal Thoughts:  No   Homicidal Thoughts:  No  Memory:   Recent and remote grossly intact   Judgement:  Other:  improving   Insight:  Improving   Psychomotor Activity:  improving   Concentration:  Attention Span: Good  Recall:  Good  Fund of Knowledge:  Good  Language:  Good  Akathisia:  Negative  Handed:    AIMS (if indicated):     Assets:  Desire for Improvement Physical Health Resilience Social Support   ADL's:  Intact  Cognition:  WNL  Sleep:  Number of Hours: 6.75     I agree with current treatment plan on 02/22/2017, Patient seen face-to-face for psychiatric evaluation follow-up, chart reviewed. Reviewed the information documented and agree with the treatment plan.  Treatment Plan Summary: Daily contact with patient to assess and evaluate symptoms and progress in treatment and Medication management  Continue with current treatment plan on 02/22/2017 except where noted  Encourage ongoing group and milieu participation to work on coping skills and symptoms reduction Encourage efforts to work on sobriety and relapse prevention  Treatment team working on discharge planning options- as noted, patient wanting to go to a rehab in [place] at discharge Continue Lexapro  5 mg  QDAY for depression, anxiety, side effects reviewed  D/C Seroquel , see rationale above  Continue Vistaril 25 mgrs Q 6 hours PRN for anxiety or insomnia Continue Neurontin 100 mgrs TID for anxiety, pain  We have discussed vital importance of continuing to follow with PCP and with GI Specialist after D/C for ongoing work up and management .   Derrill [place], NP 02/22/2017, 2:42 PM

## 2017-02-22 NOTE — BHH Group Notes (Signed)
[facility] LCSW Group Therapy Note  Date/Time:  02/22/2017 10:00-11:00AM  Type of Therapy and Topic:  Group Therapy:  Healthy and Unhealthy Supports  Participation Level:  Active   Description of Group:  Patients in this group were introduced to the idea of adding a variety of healthy supports to address the various needs in their lives. The picture on the front of Sunday's workbook was used to demonstrate why more supports are needed in every patient's life.  Patients identified and described healthy supports versus unhealthy supports in general, then gave examples of each in their own lives.   They discussed what additional healthy supports could be helpful in their recovery and wellness after discharge in order to prevent future hospitalizations.   An emphasis was placed on using counselor, doctor, therapy groups, 12-step groups, and problem-specific support groups to expand supports.  They also worked as a group on developing a specific plan for several patients to deal with unhealthy supports through [place], psychoeducation with loved ones, and even termination of relationships.   Therapeutic Goals:   1)  discuss importance of adding supports to stay well once out of the hospital  2)  compare healthy versus unhealthy supports and identify some examples of each  3)  generate ideas and descriptions of healthy supports that can be added  4)  offer mutual support about how to address unhealthy supports  5)  encourage active participation in and adherence to discharge plan    Summary of Patient Progress:  The patient shared that the current healthy supports available in her life are 1 girlfriend.  She was very focused on her anger at her family for picking her up in [place] and then bringing her and "dumping" her in a behavioral health hospital then not being in touch.  CSW tried to focus her on making sure her family has the code to get in touch, and she refused to acknowledge this.   She subsequently was only focused on how detrimental it is for her to not be given her Ativan here in the hospital although her [place] psychiatrist prescribed it for her.     Therapeutic Modalities:   Motivational Interviewing Brief Solution-Focused Therapy  Selmer Dominion, LCSW 02/22/2017, 3:40 PM

## 2017-02-22 NOTE — Progress Notes (Signed)
The patient attended the evening A.A.meeting and was appropriate.  

## 2017-02-23 NOTE — Progress Notes (Signed)
D: Pt presents with a flat affect and anxious/depressed mood. Pt continues to be suspicious of med regimen. Pt have ongoing somatic complaints. Pt reported that she was unable to take her Lexarpo yesterday because she wasn't feeling well. Pt did take lexapro this morning. Pt feels that the medications are contributing to her not feeling well. Pt stated that the Lexapro is making her feel jittery and requested vistaril for increased anxiety.  Pt rates depression 5/10. Anxiety 10/10.  A: Medications reviewed with pt. Medications administered as ordered per MD. Verbal support provided. Pt encouraged to attend groups. 15 minute checks performed for safety. R: Pt compliant with tx.

## 2017-02-23 NOTE — Progress Notes (Signed)
[facility] MD Progress Note  02/23/2017 1:01 PM Shannon Cervantes  MRN:  [id number] Subjective:   46 y.o. AAF, single, lives alone. Background history of AUD, recently diagnosed with stomach cancer. Patient was involuntarily committed after she expressed suicidal thoughts by drinking self to death. BAL was 368 mg/dl at admission.  Chart reviewed today. Patient discussed at team.   Staff reports that she is not participating at groups. She socializes with peers. She has not voiced any suicidal thoughts. She continues to express desire to drink alcohol as she feels she has a terminal illness. She has not voiced any active suicidal plans.  Seen today. Says she is in better spirits. Mood is stabilizing. She is sleeping well at night. Appetite and energy are better. She hopes to get back to [place]. Clarified concerns about stomach cancer. Says she was evaluated for gastritis. Tissue was taken for biopsy. She has not gotten the results yet. Hopes to get into inpatient addiction treatment in [place]. Not expressing any active suicidal thoughts. Concerned about her foot pain. Understands the risk of NSAIDS at this time.    Principal Problem: Major depressive disorder, recurrent severe without psychotic features ([place]) Diagnosis:   Patient Active Problem List   Diagnosis Date Noted  . Low hemoglobin [D64.9] 02/15/2017  . Gastric cancer ([place]) [C16.9] 02/15/2017  . Alcohol use disorder, moderate, dependence ([place]) [F10.20] 02/15/2017  . Viral gastroenteritis [A08.4] 01/11/2015  . Needle stick injury of finger [S61.239A, W27.3XXA] 12/11/2014  . Essential hypertension [I10] 12/11/2014  . Cough [R05] 12/11/2014  . Major depressive disorder, recurrent severe without psychotic features ([place]) [F33.2] 11/13/2014  . PTSD (post-traumatic stress disorder) [F43.10] 11/13/2014  . Alcohol use disorder, severe, dependence ([place]) [F10.20] 11/12/2014  . Suicidal ideation [R45.851] 11/12/2014  . 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 Chest pain [R07.9] 11/02/2013  . Obesity [E66.9] 08/21/2011  . Hypertensive cardiomyopathy ([place]) [I11.9, I43] 08/20/2011   Total Time spent with patient: 20 minutes  Past Psychiatric History: As in H&P  Past Medical History:  Past Medical History:  Diagnosis Date  . Alcohol abuse   . Anxiety   . Depression   . Foot fracture 01-07-2011  . Hyperlipidemia   . Hypertensive cardiomyopathy ([place])     Past Surgical History:  Procedure Laterality Date  . CESAREAN SECTION    . GASTRIC BYPASS     Family History:  Family History  Problem Relation Age of Onset  . Hypertension Mother   . Heart disease Mother   . Hyperlipidemia Mother   . Depression Mother   . Colon cancer Mother   . Heart disease Father   . Depression Father   . Renal Disease Father   . Alcohol abuse Brother   . Alcohol abuse Paternal Uncle    Family Psychiatric  History: As in H&P Social History:  History  Alcohol Use  . Yes    Comment: daily use-1 1/2 pints cognac     History  Drug Use No    Social History   Social History  . Marital status: Divorced    Spouse name: N/A  . Number of children: 4  . Years of education: 62   Occupational History  . RN    Social History Main Topics  . Smoking status: Former Smoker    Packs/day: 0.01    Types: Cigarettes  . Smokeless tobacco: Never Used     Comment: 1 Cigarettes every other Day  . Alcohol use Yes  Comment: daily use-1 1/2 pints cognac  . Drug use: No  . Sexual activity: No   Other Topics Concern  . None   Social History Narrative   Fun: Travel and shopping   Denies religious beliefs effecting healthcare.    Additional Social History:    Sleep: Good  Appetite:  Good  Current Medications: Current Facility-Administered Medications  Medication Dose Route Frequency Provider Last Rate Last Dose  . acetaminophen (TYLENOL) tablet 650 mg  650 mg Oral Q6H PRN [name], NP   650 mg at 02/23/17 1144  . alum &  mag hydroxide-simeth (MAALOX/MYLANTA) 200-200-20 MG/5ML suspension 30 mL  30 mL Oral Q4H PRN [name], NP   30 mL at 02/23/17 0806  . escitalopram (LEXAPRO) tablet 5 mg  5 mg Oral Daily Derrill Center, NP   5 mg at 02/23/17 0802  . ferrous sulfate tablet 325 mg  325 mg Oral Q breakfast [name], [name], MD   325 mg at 02/23/17 0802  . hydrOXYzine (ATARAX/VISTARIL) tablet 25 mg  25 mg Oral Q6H PRN [name], [name], MD   25 mg at 02/22/17 2244  . magnesium hydroxide (MILK OF MAGNESIA) suspension 30 mL  30 mL Oral Daily PRN [name], NP      . methocarbamol (ROBAXIN) tablet 500 mg  500 mg Oral Q8H PRN [name], NP   500 mg at 02/23/17 0802  . multivitamin with minerals tablet 1 tablet  1 tablet Oral Daily [name], NP   1 tablet at 02/23/17 0802  . ondansetron (ZOFRAN) tablet 8 mg  8 mg Oral Q8H PRN [name], NP   8 mg at 02/22/17 1014  . pantoprazole (PROTONIX) EC tablet 40 mg  40 mg Oral Daily [name], NP   40 mg at 02/23/17 0802  . thiamine (VITAMIN B-1) tablet 100 mg  100 mg Oral Daily [name], NP   100 mg at 02/23/17 0802  . white petrolatum (VASELINE) gel   Topical PRN [name], NP        Lab Results:  No results found for this or any previous visit (from the past 48 hour(s)).  Blood Alcohol level:  Lab Results  Component Value Date   ETH 368 (HH) 02/12/2017   ETH <5 37/90/2409    Metabolic Disorder Labs: No results found for: HGBA1C, MPG No results found for: PROLACTIN Lab Results  Component Value Date   CHOL 167 10/31/2014   TRIG 62.0 10/31/2014   HDL 86.90 10/31/2014   CHOLHDL 2 10/31/2014   VLDL 12.4 10/31/2014   LDLCALC 68 10/31/2014   LDLCALC 100 (H) 08/20/2011    Physical Findings: AIMS: Facial and Oral Movements Muscles of Facial Expression: None, normal Lips and Perioral Area: None, normal Jaw: None, normal Tongue: None, normal,Extremity Movements Upper (arms, wrists, hands, fingers): None, normal Lower  (legs, knees, ankles, toes): None, normal, Trunk Movements Neck, shoulders, hips: None, normal, Overall Severity Severity of abnormal movements (highest score from questions above): None, normal Incapacitation due to abnormal movements: None, normal Patient's awareness of abnormal movements (rate only patient's report): No Awareness, Dental Status Current problems with teeth and/or dentures?: No Does patient usually wear dentures?: No  CIWA:  CIWA-Ar Total: 0 COWS:     Musculoskeletal: Strength & Muscle Tone: within normal limits Gait & Station: unsteady Patient leans: N/A  Psychiatric Specialty Exam: Physical Exam  Constitutional: No distress.  HENT:  Head: Normocephalic and  atraumatic.  Respiratory: Effort normal.  Neurological: She is alert.  Skin: Skin is warm and dry. She is not diaphoretic.  Psychiatric:  As above    ROS  Blood pressure 96/67, pulse 92, temperature 99.4 F (37.4 C), temperature source Oral, resp. rate 16, height 5\' 4"  (1.626 m), weight 72.1 kg (159 lb), SpO2 100 %.Body mass index is 27.29 kg/m.  General Appearance: Calm and cooperative. Good relatedness. Appropriate behavior.    Eye Contact:  Good  Speech:  Spontaneous. Normal tone and volume.   Volume:  Soft spoken  Mood: Euthymic  Affect:  Full range and appropriate   Thought Process:  Linear  Orientation:  Full (Time, Place, and Person)  Thought Content: No delusional theme. No preoccupation with violent thoughts. No negative ruminations. No obsession.  No hallucination in any modality.   Suicidal Thoughts:  No  Homicidal Thoughts:  No  Memory:  WNL  Judgement:  Good  Insight:  Good  Psychomotor Activity:  Normal  Concentration:  Concentration: Good and Attention Span: Good  Recall:  Good  Fund of Knowledge:  Good  Language:  Good  Akathisia:  Negative  Handed:    AIMS (if indicated):     Assets:  Communication Skills Desire for Improvement  ADL's:  Intact  Cognition:  WNL  Sleep:   Number of Hours: 6    Assessment and Plan   Patient is feeling much better. She is tolerating her medications well. No active suicidal thoughts. No violent thoughts. Has completely come off alcohol. Hopeful discharge tomorrow.  Psychiatric: AUD MDD Anxiety Disorder  Medical: Cancer of the stomach Cardiomyopathy   Psychosocial:   PLAN: 1. Continue medications at current dose 2. Continue to monitor mood, behavior and interaction with peers.   [name], MD 02/23/2017, 1:01 PMPatient ID: Shannon Cervantes, female   DOB: [birth year], 46 y.o.   MRN: [id number] Patient ID: Shannon Cervantes, female   DOB: 10-14-1970, 46 y.o.   MRN: [id number]

## 2017-02-23 NOTE — Progress Notes (Signed)
Recreation Therapy Notes  Date: 02/23/17 Time: 0930 Location: [address] Group Room  Group Topic: Stress Management  Goal Area(s) Addresses:  Patient will verbalize importance of using healthy stress management.  Patient will identify positive emotions associated with healthy stress management.   Intervention: Stress Management  Activity :  Meditation.  LRT introduced the stress management technique of meditation to patients.  LRT played a meditation from the Calm app that focused on humanity.  Patients were to follow along as the meditation played to fully engage in the meditation.  Education:  Stress Management, Discharge Planning.   Education Outcome: Acknowledges edcuation/In group clarification offered/Needs additional education  Clinical Observations/Feedback: Pt did not attend group.    Victorino Sparrow, LRT/CTRS         Victorino Sparrow A 02/23/2017 12:27 PM

## 2017-02-23 NOTE — [facility] Group Notes (Signed)
LCSW Group Therapy Note   02/23/2017 10:00am   Type of Therapy and Topic:  Group Therapy:  Overcoming Obstacles   Participation Level:  Did Not Attend   Description of Group:    In this group patients will be encouraged to explore what they see as obstacles to their own wellness and recovery. They will be guided to discuss their thoughts, feelings, and behaviors related to these obstacles. The group will process together ways to cope with barriers, with attention given to specific choices patients can make. Each patient will be challenged to identify changes they are motivated to make in order to overcome their obstacles. This group will be process-oriented, with patients participating in exploration of their own experiences as well as giving and receiving support and challenge from other group members.   Therapeutic Goals: 1. Patient will identify personal and current obstacles as they relate to admission. 2. Patient will identify barriers that currently interfere with their wellness or overcoming obstacles.  3. Patient will identify feelings, thought process and behaviors related to these barriers. 4. Patient will identify two changes they are willing to make to overcome these obstacles:      Therapeutic Modalities:   Cognitive Behavioral Therapy Solution Focused Therapy Motivational Interviewing Relapse Prevention Therapy  Gladstone Lighter, LCSW 02/23/2017 9:46 AM

## 2017-02-24 LAB — CBC
HCT: 28.4 % — ABNORMAL LOW (ref 36.0–46.0)
HEMOGLOBIN: 9.2 g/dL — AB (ref 12.0–15.0)
MCH: 26.4 pg (ref 26.0–34.0)
MCHC: 32.4 g/dL (ref 30.0–36.0)
MCV: 81.4 fL (ref 78.0–100.0)
Platelets: 455 10*3/uL — ABNORMAL HIGH (ref 150–400)
RBC: 3.49 MIL/uL — ABNORMAL LOW (ref 3.87–5.11)
RDW: 17.9 % — ABNORMAL HIGH (ref 11.5–15.5)
WBC: 6.5 10*3/uL (ref 4.0–10.5)

## 2017-02-24 LAB — BASIC METABOLIC PANEL
Anion gap: 7 (ref 5–15)
BUN: 11 mg/dL (ref 6–20)
CO2: 23 mmol/L (ref 22–32)
CREATININE: 0.77 mg/dL (ref 0.44–1.00)
Calcium: 9 mg/dL (ref 8.9–10.3)
Chloride: 107 mmol/L (ref 101–111)
Glucose, Bld: 92 mg/dL (ref 65–99)
POTASSIUM: 4 mmol/L (ref 3.5–5.1)
SODIUM: 137 mmol/L (ref 135–145)

## 2017-02-24 MED ORDER — ESCITALOPRAM OXALATE 10 MG PO TABS
10.0000 mg | ORAL_TABLET | Freq: Every day | ORAL | Status: DC
  Administered 2017-02-25: 10 mg via ORAL
  Filled 2017-02-24 (×3): qty 1
  Filled 2017-02-24: qty 7

## 2017-02-24 NOTE — Progress Notes (Signed)
[facility] Group Notes:  (Nursing/MHT/Case Management/Adjunct)  Date:  02/24/2017  Time: 0930 Type of Therapy:  Nurse Education  Participation Level:  Active  Participation Quality:  Appropriate  Affect:  Appropriate  Cognitive:  Alert and Oriented  Insight:  Appropriate  Engagement in Group:  Engaged  Modes of Intervention:  Activity, Discussion, Education, Socialization and Support  Summary of Progress/Problems:The purpose of this group is to support patients to develop a goal for today and introduce them to the benefits of aromatherapy. Pt's goal is to work on Radiographer, therapeutic for anxiety.  Mosie Lukes 02/24/2017, 7:15 PM

## 2017-02-24 NOTE — Progress Notes (Signed)
Recreation Therapy Notes  Animal-Assisted Activity (AAA) Program Checklist/Progress Notes Patient Eligibility Criteria Checklist & Daily Group note for Rec TxIntervention  Date: 09.04.2018 Time: 2:45pm Location: [address]   AAA/T Program Assumption of Risk Form signed by Patient/ or Parent Legal Guardian Yes  Patient is free of allergies or sever asthma Yes  Patient reports no fear of animals Yes  Patient reports no history of cruelty to animals Yes  Patient understands his/her participation is voluntary Yes  Patient washes hands before animal contact Yes  Patient washes hands after animal contact Yes  Behavioral Response: Appropriate   Education:Hand Washing, Appropriate Animal Interaction   Education Outcome: Acknowledges education.   Clinical Observations/Feedback: Patient attended session and interacted appropriately with therapy dog and peers.   Laureen Ochs Vlasta Baskin, LRT/CTRS        Riham Polyakov L 02/24/2017 3:04 PM

## 2017-02-24 NOTE — Progress Notes (Signed)
[facility] MD Progress Note  02/24/2017 10:44 AM Shannon Cervantes  MRN:  [id number] Subjective:  Patient reports that in general she is feeling better, and is physically feeling less weak, and no longer feels dizzy. Reports improving sleep, and states she has been able to eat " a little more ".  She states she continues to have episodes of nausea, but no vomiting and states she has not needed to take Zofran over the last 24 hours . Reports her mood is also improving .  Objective : I have discussed case with treatment team and have met with patient. Staff reports patient has remained anxious , depressed. Today, she reports she is feeling " better ". Patient is presenting with improving mood and range of affect . Denies suicidal ideations. Remains vaguely anxious and with somatic concerns, but overall presents improved , more optimistic .  Behavior on unit in good control.  Denies medication side effects- states " I feel a little weird right after I take the Lexapro, but it's getting better".    Principal Problem: Major depressive disorder, recurrent severe without psychotic features ([place]) Diagnosis:   Patient Active Problem List   Diagnosis Date Noted  . Low hemoglobin [D64.9] 02/15/2017  . Gastric cancer ([place]) [C16.9] 02/15/2017  . Alcohol use disorder, moderate, dependence ([place]) [F10.20] 02/15/2017  . Viral gastroenteritis [A08.4] 01/11/2015  . Needle stick injury of finger [S61.239A, W27.3XXA] 12/11/2014  . Essential hypertension [I10] 12/11/2014  . Cough [R05] 12/11/2014  . Major depressive disorder, recurrent severe without psychotic features ([place]) [F33.2] 11/13/2014  . PTSD (post-traumatic stress disorder) [F43.10] 11/13/2014  . Alcohol use disorder, severe, dependence ([place]) [F10.20] 11/12/2014  . Suicidal ideation [R45.851] 11/12/2014  . 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 Chest pain [R07.9] 11/02/2013  . Obesity [E66.9] 08/21/2011  . Hypertensive cardiomyopathy  ([place]) [I11.9, I43] 08/20/2011   Total Time spent with patient: 20 minutes  Past Psychiatric History: As in H&P  Past Medical History:  Past Medical History:  Diagnosis Date  . Alcohol abuse   . Anxiety   . Depression   . Foot fracture 01-07-2011  . Hyperlipidemia   . Hypertensive cardiomyopathy ([place])     Past Surgical History:  Procedure Laterality Date  . CESAREAN SECTION    . GASTRIC BYPASS     Family History:  Family History  Problem Relation Age of Onset  . Hypertension Mother   . Heart disease Mother   . Hyperlipidemia Mother   . Depression Mother   . Colon cancer Mother   . Heart disease Father   . Depression Father   . Renal Disease Father   . Alcohol abuse Brother   . Alcohol abuse Paternal Uncle    Family Psychiatric  History: As in H&P Social History:  History  Alcohol Use  . Yes    Comment: daily use-1 1/2 pints cognac     History  Drug Use No    Social History   Social History  . Marital status: Divorced    Spouse name: N/A  . Number of children: 4  . Years of education: 66   Occupational History  . RN    Social History Main Topics  . Smoking status: Former Smoker    Packs/day: 0.01    Types: Cigarettes  . Smokeless tobacco: Never Used     Comment: 1 Cigarettes every other Day  . Alcohol use Yes     Comment: daily use-1 1/2 pints cognac  . Drug  use: No  . Sexual activity: No   Other Topics Concern  . None   Social History Narrative   Fun: Travel and shopping   Denies religious beliefs effecting healthcare.    Additional Social History:    Sleep: improving   Appetite:  Improving   Current Medications: Current Facility-Administered Medications  Medication Dose Route Frequency Provider Last Rate Last Dose  . acetaminophen (TYLENOL) tablet 650 mg  650 mg Oral Q6H PRN [name], NP   650 mg at 02/24/17 0837  . alum & mag hydroxide-simeth (MAALOX/MYLANTA) 200-200-20 MG/5ML suspension 30 mL  30 mL Oral Q4H PRN [name], NP   30 mL at 02/23/17 0806  . escitalopram (LEXAPRO) tablet 5 mg  5 mg Oral Daily Derrill Center, NP   5 mg at 02/24/17 0834  . ferrous sulfate tablet 325 mg  325 mg Oral Q breakfast [name], [name], MD   325 mg at 02/24/17 0835  . hydrOXYzine (ATARAX/VISTARIL) tablet 25 mg  25 mg Oral Q6H PRN [name], [name], MD   25 mg at 02/23/17 2241  . magnesium hydroxide (MILK OF MAGNESIA) suspension 30 mL  30 mL Oral Daily PRN [name], NP      . methocarbamol (ROBAXIN) tablet 500 mg  500 mg Oral Q8H PRN [name], NP   500 mg at 02/24/17 0836  . multivitamin with minerals tablet 1 tablet  1 tablet Oral Daily [name], NP   1 tablet at 02/24/17 0835  . ondansetron (ZOFRAN) tablet 8 mg  8 mg Oral Q8H PRN [name], NP   8 mg at 02/22/17 1014  . pantoprazole (PROTONIX) EC tablet 40 mg  40 mg Oral Daily [name], NP   40 mg at 02/24/17 0834  . thiamine (VITAMIN B-1) tablet 100 mg  100 mg Oral Daily [name], NP   100 mg at 02/24/17 0834  . white petrolatum (VASELINE) gel   Topical PRN [name], NP        Lab Results:  No results found for this or any previous visit (from the past 48 hour(s)).  Blood Alcohol level:  Lab Results  Component Value Date   ETH 368 (HH) 02/12/2017   ETH <5 62/95/2841    Metabolic Disorder Labs: No results found for: HGBA1C, MPG No results found for: PROLACTIN Lab Results  Component Value Date   CHOL 167 10/31/2014   TRIG 62.0 10/31/2014   HDL 86.90 10/31/2014   CHOLHDL 2 10/31/2014   VLDL 12.4 10/31/2014   LDLCALC 68 10/31/2014   LDLCALC 100 (H) 08/20/2011    Physical Findings: AIMS: Facial and Oral Movements Muscles of Facial Expression: None, normal Lips and Perioral Area: None, normal Jaw: None, normal Tongue: None, normal,Extremity Movements Upper (arms, wrists, hands, fingers): None, normal Lower (legs, knees, ankles, toes): None, normal, Trunk Movements Neck, shoulders, hips: None, normal, Overall  Severity Severity of abnormal movements (highest score from questions above): None, normal Incapacitation due to abnormal movements: None, normal Patient's awareness of abnormal movements (rate only patient's report): No Awareness, Dental Status Current problems with teeth and/or dentures?: No Does patient usually wear dentures?: No  CIWA:  CIWA-Ar Total: 0 COWS:     Musculoskeletal: Strength & Muscle Tone: within normal limits Gait & Station: unsteady Patient leans: N/A  Psychiatric Specialty Exam: Physical Exam  Constitutional: No distress.  HENT:  Head: Normocephalic and atraumatic.  Respiratory: Effort normal.  Neurological:  She is alert.  Skin: Skin is warm and dry. She is not diaphoretic.  Psychiatric:  As above    ROS  No headache, no chest pain, no shortness of breath, no bleeding , remains constipated .  Blood pressure 109/69, pulse (!) 106, temperature 99.5 F (37.5 C), temperature source Oral, resp. rate 16, height _0  (1.626 m), weight 72.1 kg (159 lb), SpO2 100 %.Body mass index is 27.29 kg/m.  General Appearance: improving grooming, presents better related   Eye Contact:  Good  Speech:  Normal   Volume:  Normal   Mood:  Improving mood, feels less depressed   Affect:  Toy Cookey in range   Thought Process:  Linear and Descriptions of Associations: Intact  Orientation:  Full (Time, Place, and Person)  Thought Content: denies hallucinations, no delusions, not internally preoccupied  Suicidal Thoughts:  No denies suicidal ideations, no homicidal or violent ideations   Homicidal Thoughts:  No  Memory:  Recent and remote grossly intact   Judgement:  Good  Insight:  Good  Psychomotor Activity:  Normal  Concentration:  Concentration: Good and Attention Span: Good  Recall:  Good  Fund of Knowledge:  Good  Language:  Good  Akathisia:  Negative  Handed:    AIMS (if indicated):     Assets:  Communication Skills Desire for Improvement  ADL's:  Intact  Cognition:   WNL  Sleep:  Number of Hours: 6.5    Assessment - patient has improved compared to admission- reports improving mood, and does present with a fuller range of affect . No suicidal ideations at this time, and more future oriented. Continues to have somatic complaints but overall reports she has less nausea, has not vomited, and feels less weak or dizzy .   Plan- Treatment plan reviewed as below today 9/4  Increase Lexapro to 10 mgrs QDAY for depression and anxiety Continue Protonix for GERD, GI symptoms Continue Ferrous Sulfate for Anemia She has an established PCP in [place], [place] ([place] ) She had been going to [place], [place] for psychiatric care. She reports she intends to follow up with GI specialist in [facility] to clarify GI status and diagnosis.   Jenne Campus, MD 02/24/2017, 10:44 AM   Patient ID: Bertram Millard, female   DOB: 08-14-1970, 46 y.o.   MRN: [id number]

## 2017-02-24 NOTE — [facility] Group Notes (Signed)
[facility] Group Therapy 02/24/2017 1:15pm  Type of Therapy: Mental Health Association Presentation  Participation Level: Active  Participation Quality: Attentive  Affect: Appropriate  Cognitive: Oriented  Insight: Developing/Improving  Engagement in Therapy: Engaged  Modes of Intervention: Discussion, Education and Socialization  Summary of Progress/Problems: [place] ([place]) Speaker came to talk about his personal journey with mental health. The pt processed ways by which to relate to the speaker. [place] speaker provided handouts and educational information pertaining to groups and services offered by the [facility]. Pt was engaged in speaker's presentation and was receptive to resources provided.    Gladstone Lighter, LCSW 02/24/2017 2:04 PM

## 2017-02-24 NOTE — Progress Notes (Signed)
Patient ID: Shannon Cervantes, female   DOB: 1971-03-30, 46 y.o.   MRN: [id number]  D: Patient lying in bed with eyes closed tonight. Appears to be sleeping at present. Given tylenol for foot pain and vistaril for sleep by previous 7-11pm nurse. No complaints at present. A: Staff will monitor on q 15 minute checks, follow treatment plan, and give medications as ordered. R: Cooperative on the unit

## 2017-02-24 NOTE — Progress Notes (Signed)
D:Pt has been out of her room interacting with peers. She c/o rt foot pain this morning and was given prn pain medication. She is scheduled for labs this evening. Pt rates depression as a 4 and anxiety as a 7 on 0-10 scale with 10 being the most. A:Offered support, encouragement and 15 minute checks.  R:Pt denies si and hi. Safety maintained on he unit.

## 2017-02-24 NOTE — Progress Notes (Signed)
[place] Group Notes:  (Nursing/MHT/Case Management/Adjunct)  Date:  02/24/2017  Time:  2100 Type of Therapy:  wrap up group  Participation Level:  Active  Participation Quality:  Appropriate, Attentive, Sharing and Supportive  Affect:  Depressed and Flat  Cognitive:  Appropriate  Insight:  Improving  Engagement in Group:  Engaged  Modes of Intervention:  Clarification, Education and Support  Summary of Progress/Problems: Pt shared that she has had reoccurring depression since her early twenties but seems to eventually bounce back. Pt shares this time it seems to be harder to get out of the hole. Pt wants to get to a place where she doesn't feel as sad. Pt shares that she would like to learn that she cannot do it all. Pt takes on too much and gets overwhelmed and then cant seem to do anything. Pt is grateful though that she gets to "ride this ride" called life.   Shellia Cleverly 02/24/2017, 11:26 PM

## 2017-02-25 MED ORDER — FERROUS SULFATE 325 (65 FE) MG PO TABS: 325.0000 mg | ORAL_TABLET | Freq: Every day | ORAL | 0 refills | Status: DC

## 2017-02-25 MED ORDER — ESCITALOPRAM OXALATE 10 MG PO TABS: 10.0000 mg | ORAL_TABLET | Freq: Every day | ORAL | 0 refills | Status: DC

## 2017-02-25 MED ORDER — HYDROXYZINE HCL 25 MG PO TABS: 25.0000 mg | ORAL_TABLET | Freq: Four times a day (QID) | ORAL | 0 refills | Status: DC | PRN

## 2017-02-25 MED ORDER — PANTOPRAZOLE SODIUM 40 MG PO TBEC: 40.0000 mg | DELAYED_RELEASE_TABLET | Freq: Every day | ORAL | 0 refills | Status: DC

## 2017-02-25 NOTE — Progress Notes (Signed)
Recreation Therapy Notes  Date: 02/25/17 Time: 0930 Location: [address] Dayroom  Group Topic: Stress Management  Goal Area(s) Addresses:  Patient will verbalize importance of using healthy stress management.  Patient will identify positive emotions associated with healthy stress management.   Intervention: Stress Management  Activity :  [employer].  LRT introduced the stress management technique of guided imagery.  LRT read a script to allow patients to engage in a mental vacation in the wilderness.  Patients were to follow along as the script was read to participate in the activity.  Education:  Stress Management, Discharge Planning.   Education Outcome: Acknowledges edcuation/In group clarification offered/Needs additional education  Clinical Observations/Feedback: Pt did not attend group.   Victorino Sparrow, LRT/CTRS         Ria Comment, Rene Gonsoulin A 02/25/2017 1:19 PM
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
## 2017-02-25 NOTE — [facility]HH Suicide Risk Assessment (Signed)
[facility] Discharge Suicide Risk Assessment   Principal Problem: Major depressive disorder, recurrent severe without psychotic features [facility]) Discharge Diagnoses:  Patient Active Problem List   Diagnosis Date Noted  . Low hemoglobin [D64.9] 02/15/2017  . Gastric cancer ([place]) [C16.9] 02/15/2017  . Alcohol use disorder, moderate, dependence ([place]) [F10.20] 02/15/2017  . Viral gastroenteritis [A08.4] 01/11/2015  . Needle stick injury of finger [S61.239A, W27.3XXA] 12/11/2014  . Essential hypertension [I10] 12/11/2014  . Cough [R05] 12/11/2014  . Major depressive disorder, recurrent severe without psychotic features ([place]) [F33.2] 11/13/2014  . PTSD (post-traumatic stress disorder) [F43.10] 11/13/2014  . Alcohol use disorder, severe, dependence ([place]) [F10.20] 11/12/2014  . Suicidal ideation [R45.851] 11/12/2014  . 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 Chest pain [R07.9] 11/02/2013  . Obesity [E66.9] 08/21/2011  . Hypertensive cardiomyopathy ([place]) [I11.9, I43] 08/20/2011    Total Time spent with patient: 30 minutes  Musculoskeletal: Strength & Muscle Tone: within normal limits Gait & Station: normal Patient leans: N/A  Psychiatric Specialty Exam: ROS mild headache, now improved, no chest pain, no nausea, no vomiting , no fever, no chills , denies melenas   Blood pressure 113/77, pulse (!) 105, temperature 99 F (37.2 C), temperature source Oral, resp. rate 16, height 5\' 4"  (1.626 m), weight 72.1 kg (159 lb), SpO2 100 %.Body mass index is 27.29 kg/m.  General Appearance: Well Groomed  Eye Contact::  Good  Speech:  Normal Rate409  Volume:  Normal  Mood:  improving , states " I am feeling OK today"  Affect:  appropriate, reactive   Thought Process:  Linear and Descriptions of Associations: Intact  Orientation:  Full (Time, Place, and Person)  Thought Content:  denies hallucinations, no delusions , not internally preoccupied   Suicidal Thoughts:  No denies any  suicidal or self injurious ideations, denies any homicidal or violent ideations  Homicidal Thoughts:  No  Memory:  recent and remote grossly intact   Judgement:  Other:  improving   Insight:  improving  Psychomotor Activity:  Normal  Concentration:  Good  Recall:  Good  Fund of Knowledge:Good  Language: Good  Akathisia:  Negative  Handed:  Right  AIMS (if indicated):     Assets:  Communication Skills Desire for Improvement Social Support  Sleep:  Number of Hours: 3.75  Cognition: WNL  ADL's:  Intact   Mental Status Per Nursing Assessment::   On Admission:  NA  Demographic Factors:  46 year old divorced female, has 4 children , youngest is 69 , currently with family, patient lives in [place]  Loss Factors: Medical issues , recent relapse   Historical Factors: History of depression, history of alcohol use disorder   Risk Reduction Factors:   Responsible for children under 59 years of age, Sense of responsibility to family and Positive coping skills or problem solving skills  Continued Clinical Symptoms:  At this time patient is alert , attentive, well related, calm, mood is improved, states she feels much better, affect is more reactive, no thought disorder, no suicidal or self injurious ideations, no homicidal or violent ideations, no psychotic symptoms,future oriented . On unit presents calm, behavior in good control, pleasant on approach. Denies medication side effects.  Cognitive Features That Contribute To Risk:  No gross cognitive deficits noted upon discharge. Is alert , attentive, and oriented x 3   Suicide Risk:  Mild:  Suicidal ideation of limited frequency, intensity, duration, and specificity.  There are no identifiable plans, no associated intent, mild dysphoria  and related symptoms, good self-control (both objective and subjective assessment), few other risk factors, and identifiable protective factors, including available and accessible social  support.  Follow-up Information    [facility] Follow up on 02/27/2017.   Specialty:  Behavioral Health Why:  at 8:00 AM for your hospital discharge appointment. Bring hospital discharge paperwork, photo ID, proof of insurance. Plan to spend 2 -3 hours to complete process. If you will not be in [place], please cancel this appointment.  Contact information: [place] [address] [phone]        [facility] Follow up.   Why:  Please go within 1-3 days of discharge to be established for outpatient medication management and therapy. This facility offers mental health and substance abuse outpatient treatment. Walk-in hours are Monday-Friday from 8am-10am. Contact information: [facility] [address] [place] [address] [phone] Fax: [phone]          Plan Of Care/Follow-up recommendations:  Activity:  as tolerated  Diet:  regular Tests:  NA Other:  see below Patient is leaving unit in good spirits Plans to return to [place] later this week Plans to follow up as above Plans to follow up at [facility] and plans to follow up with her Gastroenterologist in [place] on arrival. Encouraged ongoing efforts to work on sobriety and relapse prevention.  Jenne Campus, MD 02/25/2017, 10:23 AM

## 2017-02-25 NOTE — Tx Team (Signed)
Interdisciplinary Treatment and Diagnostic Plan Update  02/25/2017 Time of Session: [place] MRN: [id number]  Principal Diagnosis: Major depressive disorder, recurrent severe without psychotic features ([place])  Secondary Diagnoses: Principal Problem:   Major depressive disorder, recurrent severe without psychotic features ([place]) Active Problems:   Low hemoglobin   Gastric cancer (HCC)   Alcohol use disorder, moderate, dependence ([place])   Current Medications:  Current Facility-Administered Medications  Medication Dose Route Frequency Provider Last Rate Last Dose  . acetaminophen (TYLENOL) tablet 650 mg  650 mg Oral Q6H PRN [name], NP   650 mg at 02/25/17 0809  . alum & mag hydroxide-simeth (MAALOX/MYLANTA) 200-200-20 MG/5ML suspension 30 mL  30 mL Oral Q4H PRN [name], NP   30 mL at 02/23/17 0806  . escitalopram (LEXAPRO) tablet 10 mg  10 mg Oral Daily [name], [name], MD   10 mg at 02/25/17 0807  . ferrous sulfate tablet 325 mg  325 mg Oral Q breakfast [name], [name], MD   325 mg at 02/25/17 0809  . hydrOXYzine (ATARAX/VISTARIL) tablet 25 mg  25 mg Oral Q6H PRN [name], [name], MD   25 mg at 02/24/17 2247  . magnesium hydroxide (MILK OF MAGNESIA) suspension 30 mL  30 mL Oral Daily PRN [name], NP      . methocarbamol (ROBAXIN) tablet 500 mg  500 mg Oral Q8H PRN [name], NP   500 mg at 02/25/17 0809  . multivitamin with minerals tablet 1 tablet  1 tablet Oral Daily [name], NP   1 tablet at 02/25/17 0807  . ondansetron (ZOFRAN) tablet 8 mg  8 mg Oral Q8H PRN [name], NP   8 mg at 02/22/17 1014  . pantoprazole (PROTONIX) EC tablet 40 mg  40 mg Oral Daily [name], NP   40 mg at 02/25/17 7017  . thiamine (VITAMIN B-1) tablet 100 mg  100 mg Oral Daily [name], NP   100 mg at 02/25/17 0800  . white petrolatum (VASELINE) gel   Topical PRN [name], NP       PTA Medications: Prescriptions Prior to Admission   Medication Sig Dispense Refill Last Dose  . acetaminophen (TYLENOL) 500 MG tablet Take 1,000 mg by mouth every 6 (six) hours as needed for moderate pain.   Past Week at Unknown time  . buPROPion (WELLBUTRIN XL) 150 MG 24 hr tablet Take 150 mg by mouth daily.   Past Month at Unknown time  . buPROPion (WELLBUTRIN XL) 300 MG 24 hr tablet Take 1 tablet (300 mg total) by mouth daily. (Patient not taking: Reported on 06/23/2015) 30 tablet 0 Not Taking at Unknown time  . chlordiazePOXIDE (LIBRIUM) 25 MG capsule 50mg  PO TID x 1D, then 25-50mg  PO BID X 1D, then 25-50mg  PO QD X 1D (Patient not taking: Reported on 02/13/2017) 10 capsule 0 Not Taking at Unknown time  . ibuprofen (ADVIL,MOTRIN) 200 MG tablet Take 800 mg by mouth every 6 (six) hours as needed for fever, headache, mild pain, moderate pain or cramping.    Past Week at Unknown time  . lisinopril (PRINIVIL,ZESTRIL) 5 MG tablet Take 1 tablet (5 mg total) by mouth daily. (Patient not taking: Reported on 02/13/2017) 30 tablet 0 Not Taking at Unknown time  . pantoprazole (PROTONIX) 40 MG tablet Take 1 tablet (40 mg total) by mouth daily. (Patient not taking: Reported on 02/13/2017) 30 tablet 1 Not Taking at Unknown  time  . potassium chloride SA (K-DUR,KLOR-CON) 20 MEQ tablet Take 1 tablet (20 mEq total) by mouth daily. (Patient not taking: Reported on 06/23/2015) 5 tablet 0 Not Taking at Unknown time    Patient Stressors: Financial difficulties Health problems Substance abuse  Patient Strengths: Capable of independent living Curator fund of knowledge Motivation for treatment/growth  Treatment Modalities: Medication Management, Group therapy, Case management,  1 to 1 session with clinician, Psychoeducation, Recreational therapy.   Physician Treatment Plan for Primary Diagnosis: Major depressive disorder, recurrent severe without psychotic features ([place]) Long Term Goal(s): Improvement in symptoms so as ready for  discharge Improvement in symptoms so as ready for discharge   Short Term Goals: Ability to verbalize feelings will improve Ability to disclose and discuss suicidal ideas Ability to identify and develop effective coping behaviors will improve Compliance with prescribed medications will improve  Medication Management: Evaluate patient's response, side effects, and tolerance of medication regimen.  Therapeutic Interventions: 1 to 1 sessions, Unit Group sessions and Medication administration.  Evaluation of Outcomes: Adequate for Discharge  Physician Treatment Plan for Secondary Diagnosis: Principal Problem:   Major depressive disorder, recurrent severe without psychotic features ([place]) Active Problems:   Low hemoglobin   Gastric cancer (HCC)   Alcohol use disorder, moderate, dependence ([place])  Long Term Goal(s): Improvement in symptoms so as ready for discharge Improvement in symptoms so as ready for discharge   Short Term Goals: Ability to verbalize feelings will improve Ability to disclose and discuss suicidal ideas Ability to identify and develop effective coping behaviors will improve Compliance with prescribed medications will improve     Medication Management: Evaluate patient's response, side effects, and tolerance of medication regimen.  Therapeutic Interventions: 1 to 1 sessions, Unit Group sessions and Medication administration.  Evaluation of Outcomes: Adequate for Discharge   RN Treatment Plan for Primary Diagnosis: Major depressive disorder, recurrent severe without psychotic features ([place]) Long Term Goal(s): Knowledge of disease and therapeutic regimen to maintain health will improve  Short Term Goals: Ability to remain free from injury will improve, Ability to demonstrate self-control and Ability to disclose and discuss suicidal ideas  Medication Management: RN will administer medications as ordered by provider, will assess and evaluate patient's response and  provide education to patient for prescribed medication. RN will report any adverse and/or side effects to prescribing provider.  Therapeutic Interventions: 1 on 1 counseling sessions, Psychoeducation, Medication administration, Evaluate responses to treatment, Monitor vital signs and CBGs as ordered, Perform/monitor CIWA, COWS, AIMS and Fall Risk screenings as ordered, Perform wound care treatments as ordered.  Evaluation of Outcomes: Adequate for Discharge   LCSW Treatment Plan for Primary Diagnosis: Major depressive disorder, recurrent severe without psychotic features ([place]) Long Term Goal(s): Safe transition to appropriate next level of care at discharge, Engage patient in therapeutic group addressing interpersonal concerns.  Short Term Goals: Engage patient in aftercare planning with referrals and resources, Facilitate patient progression through stages of change regarding substance use diagnoses and concerns and Identify triggers associated with mental health/substance abuse issues  Therapeutic Interventions: Assess for all discharge needs, 1 to 1 time with Social worker, Explore available resources and support systems, Assess for adequacy in community support network, Educate family and significant other(s) on suicide prevention, Complete Psychosocial Assessment, Interpersonal group therapy.  Evaluation of Outcomes: Adequate for Discharge   Progress in Treatment: Attending groups: Yes. Participating in groups: Yes. Taking medication as prescribed: Yes. Toleration medication: Yes. Family/Significant other contact made: Yes with brother Patient understands  diagnosis: Yes. Discussing patient identified problems/goals with staff: Yes. Medical problems stabilized or resolved: Yes. Denies suicidal/homicidal ideation: Yes. Issues/concerns per patient self-inventory: No. Other: n/a   New problem(s) identified: Yes, Describe:  pt has possible stomach cancer diagnosis and will likely need  to get treated for this before entering substance abuse treatment program.    8/31: No cancer diagnosis; has had endoscopy at another facility and no cancer diagnosis was given. Is requesting residential treatment in [place], [place] Term/Long Term Goal(s): alcohol detox; medication management for mood stabilization; development of comprehensive mental wellness/sobriety plan.   Patient Goal: "to get more energy and get my medications fixed."   Discharge Plan or Barriers: CSW assessing for appropriate referrals.--[facility] for outpatient treatment. Pt provideed with inpatient options for after her medical issues have been treated. [place] and AA/NA lists also provided to pt for additional community support.  8/31: Pt now requesting residential treatment in [place]; [place] [facility] for appropriate referrals   02/25/2017: Pt plans to return to [place]; Pt will have follow-up in [place] and provided with list of possible inpatient treatment options in that area  Reason for Continuation of Hospitalization: None identified at this time.   Estimated Length of Stay: 0 days; Pt stable for DC today  Attendees: Patient:  02/25/2017 9:37 AM  Physician: Dr. Parke Poisson MD 02/25/2017 9:37 AM  Nursing: Jinny Sanders, RN 02/25/2017 9:37 AM  RN Care Manager: Lars Pinks, RN 02/25/2017 9:37 AM  Social Worker: Adriana Reams, LCSW 02/25/2017 9:37 AM  Recreational Therapist:  02/25/2017 9:37 AM  Other: Lindell Spar, NP; Marvia Pickles, NP 02/25/2017 9:37 AM  Other:  02/25/2017 9:37 AM  Other: 02/25/2017 9:37 AM    Scribe for Treatment Team: Gladstone Lighter, LCSW 02/25/2017 9:37 AM

## 2017-02-25 NOTE — Progress Notes (Signed)
D: Patient observed up and visible in the milieu. Interacting appropriately. Patient verbalizes to this Probation officer she is discharging today and while anxious, feels comfortable with this plan. Patient's affect anxious with congruent mood. Pleasant and cooperative. Per self inventory and discussions with writer, rates depression at a 1/10, hopelessness at a 1/10 and anxiety at a 7/10. Rates sleep as poor, appetite as fair, energy as normal and concentration as good.  States goal for today is to "discharge home." Reporting R foot pain at a 10/10.   A: Medicated per orders, prn tylenol and robaxin given for discomfort. Level III obs in place for safety. Emotional support offered and self inventory reviewed. Encouraged completion of Suicide Safety Plan and programming participation. Discussed POC with MD, SW.  Fall prevention plan in place and reviewed with patient as pt is a high fall risk due to fractured foot.   R: Patient verbalizes understanding of POC, falls prevention education. On reassess, patient reports pain is a 3/10. Patient denies SI/HI/AVH and remains safe on level III obs. Will continue to monitor closely and make verbal contact frequently. Awaiting transportation confirmation for discharge.

## 2017-02-25 NOTE — Progress Notes (Signed)
  [facility] Adult Case Management Discharge Plan :  Will you be returning to the same living situation after discharge:  No. Pt plans to return to [place] where her residence is At discharge, do you have transportation home?: Yes,  pt provided with bus passes Do you have the ability to pay for your medications: Yes,  Pt provided with samples and prescriptions  Release of information consent forms completed and in the chart;  Patient's signature needed at discharge.  Patient to Follow up at: Follow-up Information    [facility] Follow up on 02/27/2017.   Specialty:  Behavioral Health Why:  at 8:00 AM for your hospital discharge appointment. Bring hospital discharge paperwork, photo ID, proof of insurance. Plan to spend 2 -3 hours to complete process. If you will not be in [place], please cancel this appointment.  Contact information: [place] [address] [phone]        [facility] Follow up.   Why:  Please go within 1-3 days of discharge to be established for outpatient medication management and therapy. This facility offers mental health and substance abuse outpatient treatment. Walk-in hours are Monday-Friday from 8am-10am. Contact information: [facility] [address] [place] [address] [phone] Fax: [phone]          Next level of care provider has access to [place] and Suicide Prevention discussed: Yes,  with brother; see SPE note  Have you used any form of tobacco in the last 30 days? (Cigarettes, Smokeless Tobacco, Cigars, and/or Pipes): No  Has patient been referred to the Quitline?: N/A patient is not a smoker  Patient has been referred for addiction treatment: Yes  Gladstone Lighter, LCSW 02/25/2017, 9:34 AM

## 2017-02-25 NOTE — Progress Notes (Signed)
Patient verbalizes readiness for discharge. Follow up plan explained, AVS, transition record and SRA given along with prescriptions. Sample meds provided. All belongings returned. Patient verbalizes understanding. Denies SI/HI and assures this Probation officer she will seek assistance should that change. Patient discharged ambulatory and in stable condition to her brother.

## 2017-02-25 NOTE — Progress Notes (Signed)
Pt states she had a good day and feels her medications are helpful and working for her, although she is still having pain in her broken R foot and the sprain in her ankle injured prior to admission.  She says the Tylenol helps some, but that she had been given Tylenol earlier before shift change.  Her pain was 6/10 on evening shift assessment.  Pt was reminded that she had Robaxin available and was given a dose at the beginning of the shift.  Pt denies SI/HI/AVH at this time.  She has been appropriate and cooperative this shift.  She has been in the dayroom talking with peers and watching TV.  Support and encouragement offered.  Discharge plans are in process.  Safety maintained with q15 minute checks.

## 2017-07-10 ENCOUNTER — Encounter (HOSPITAL_COMMUNITY): Payer: Self-pay | Admitting: Family Medicine

## 2017-07-10 ENCOUNTER — Ambulatory Visit (HOSPITAL_COMMUNITY)
Admission: EM | Admit: 2017-07-10 | Discharge: 2017-07-10 | Disposition: A | Discharge status: Home / Self Care | Payer: Self-pay | Attending: Emergency Medicine | Admitting: Emergency Medicine

## 2017-07-10 DIAGNOSIS — Z8249 Family history of ischemic heart disease and other diseases of the circulatory system: Secondary | ICD-10-CM | POA: Insufficient documentation

## 2017-07-10 DIAGNOSIS — E785 Hyperlipidemia, unspecified: Secondary | ICD-10-CM | POA: Insufficient documentation

## 2017-07-10 DIAGNOSIS — Z841 Family history of disorders of kidney and ureter: Secondary | ICD-10-CM | POA: Insufficient documentation

## 2017-07-10 DIAGNOSIS — Z87891 Personal history of nicotine dependence: Secondary | ICD-10-CM | POA: Insufficient documentation

## 2017-07-10 DIAGNOSIS — F419 Anxiety disorder, unspecified: Secondary | ICD-10-CM | POA: Insufficient documentation

## 2017-07-10 DIAGNOSIS — Z818 Family history of other mental and behavioral disorders: Secondary | ICD-10-CM | POA: Insufficient documentation

## 2017-07-10 DIAGNOSIS — N949 Unspecified condition associated with female genital organs and menstrual cycle: Secondary | ICD-10-CM

## 2017-07-10 DIAGNOSIS — F329 Major depressive disorder, single episode, unspecified: Secondary | ICD-10-CM | POA: Insufficient documentation

## 2017-07-10 DIAGNOSIS — R102 Pelvic and perineal pain: Secondary | ICD-10-CM | POA: Insufficient documentation

## 2017-07-10 DIAGNOSIS — J111 Influenza due to unidentified influenza virus with other respiratory manifestations: Secondary | ICD-10-CM | POA: Insufficient documentation

## 2017-07-10 DIAGNOSIS — Z811 Family history of alcohol abuse and dependence: Secondary | ICD-10-CM | POA: Insufficient documentation

## 2017-07-10 DIAGNOSIS — Z9884 Bariatric surgery status: Secondary | ICD-10-CM | POA: Insufficient documentation

## 2017-07-10 DIAGNOSIS — R69 Illness, unspecified: Secondary | ICD-10-CM

## 2017-07-10 DIAGNOSIS — Z800 Family history of malignant neoplasm of digestive organs: Secondary | ICD-10-CM | POA: Insufficient documentation

## 2017-07-10 LAB — POCT URINALYSIS DIP (DEVICE)
Bilirubin Urine: NEGATIVE
Glucose, UA: NEGATIVE mg/dL
Ketones, ur: NEGATIVE mg/dL
Leukocytes, UA: NEGATIVE
NITRITE: NEGATIVE
PH: 7 (ref 5.0–8.0)
PROTEIN: NEGATIVE mg/dL
Specific Gravity, Urine: 1.01 (ref 1.005–1.030)
UROBILINOGEN UA: 0.2 mg/dL (ref 0.0–1.0)

## 2017-07-10 LAB — POCT PREGNANCY, URINE: Preg Test, Ur: NEGATIVE

## 2017-07-10 MED ORDER — CEFTRIAXONE SODIUM 250 MG IJ SOLR
INTRAMUSCULAR | Status: AC
  Filled 2017-07-10: qty 250

## 2017-07-10 MED ORDER — NITROFURANTOIN MONOHYD MACRO 100 MG PO CAPS: 100.0000 mg | ORAL_CAPSULE | Freq: Two times a day (BID) | ORAL | 0 refills | Status: DC

## 2017-07-10 MED ORDER — OSELTAMIVIR PHOSPHATE 75 MG PO CAPS: 75.0000 mg | ORAL_CAPSULE | Freq: Two times a day (BID) | ORAL | 0 refills | Status: DC

## 2017-07-10 MED ORDER — AZITHROMYCIN 250 MG PO TABS
1000.0000 mg | ORAL_TABLET | Freq: Once | ORAL | Status: AC
  Administered 2017-07-10: 1000 mg via ORAL

## 2017-07-10 MED ORDER — ONDANSETRON HCL 4 MG PO TABS: 4.0000 mg | ORAL_TABLET | Freq: Four times a day (QID) | ORAL | 0 refills | Status: DC

## 2017-07-10 MED ORDER — CEFTRIAXONE SODIUM 250 MG IJ SOLR
250.0000 mg | Freq: Once | INTRAMUSCULAR | Status: AC
  Administered 2017-07-10: 250 mg via INTRAMUSCULAR

## 2017-07-10 MED ORDER — AZITHROMYCIN 250 MG PO TABS
ORAL_TABLET | ORAL | Status: AC
  Filled 2017-07-10: qty 4

## 2017-07-10 MED ORDER — STERILE WATER FOR INJECTION IJ SOLN
INTRAMUSCULAR | Status: AC
  Filled 2017-07-10: qty 10

## 2017-07-10 NOTE — Discharge Instructions (Signed)
If at any point you feel like your fever is worsening despite taking medications please go to the hospital for full evaluation..  Abdominal exam today is most reassuring however I do worry buttressing motion tenderness.  I have referred you to Dr. Yehuda Savannah GYN for this and I think she will be able to help you further.  It is okay to stop the Keflex and start nitrofurantoin to cover for UTI.  I will send off for culture but given that she been taking Keflex and not sure how it shows that result either.  It is certainly possible you can also have the flu given your body aches and new onset fever.  Tamiflu is fairly low risk drug you can start that tonight as well.  Please take at thousand milligrams of Tylenol every 8 hours as needed for pain.

## 2017-07-10 NOTE — ED Provider Notes (Signed)
07/10/2017 8:12 PM   DOB: 1971-02-15 / MRN: [id number]  SUBJECTIVE:  Shannon Cervantes is a 47 y.o. female presenting for fever and body aches.  States she also has a UTI and a stomach virus.  He was seen in the ED and advised to take Keflex twice daily however she is only taken 1 daily.  She has weakness and dizziness to compound her chief complaint here today.  Per the chart she has a long history of alcohol and opioid abuse.  She was recently admitted to the hospital for SI and substance.  She tells me she was in the hospital in [place] about 9 days ago and was treated for nausea along with Keflex for UTI.  She has not been able to take this entire Keflex dosing regimen as it caused her to have worsening diarrhea.  Tells me the diarrhea is largely resolved since she slowed down on the Keflex.  She denies dysuria, urgency, states that about 3 days ago began to have new onset fever frequency, hematuria, flank pain.  Chills and body aches.  She denies cough.  She does associate generalized nonpulsatile headache without vision changes.  She is frustrated because she feels like she just cannot get well.    She is allergic to suboxone [buprenorphine hcl-naloxone hcl].   She  has a past medical history of Alcohol abuse, Anxiety, Depression, Foot fracture (01-07-2011), Hyperlipidemia, and Hypertensive cardiomyopathy ([place]).    She  reports that she has quit smoking. Her smoking use included cigarettes. She smoked 0.01 packs per day. she has never used smokeless tobacco. She reports that she drinks alcohol. She reports that she does not use drugs. She  reports that she does not engage in sexual activity. The patient  has a past surgical history that includes Cesarean section and Gastric bypass.  Her family history includes Alcohol abuse in her brother and paternal uncle; Colon cancer in her mother; Depression in her father and mother; Heart disease in her father and mother; Hyperlipidemia in her mother;  Hypertension in her mother; Renal Disease in her father.  Review of Systems  Constitutional: Negative for weight loss.  Cardiovascular: Negative for chest pain.  Skin: Negative for rash.  Neurological: Negative for dizziness.    OBJECTIVE:  BP 131/81   Pulse 90   Temp 99.3 F (37.4 C)   Resp 18   SpO2 100%   Physical Exam  Constitutional: She is oriented to person, place, and time. She is active.  Non-toxic appearance. She does not have a sickly appearance. She appears ill. No distress.  Cardiovascular: Normal rate and regular rhythm.  Pulmonary/Chest: Effort normal and breath sounds normal. No tachypnea. No respiratory distress. She has no wheezes. She has no rales. She exhibits no tenderness.  Abdominal: Soft. Bowel sounds are normal. She exhibits no distension and no mass. There is no tenderness. There is no rebound and no guarding.  Genitourinary: Cervix exhibits no discharge and no friability. Right adnexum displays no mass, no tenderness and no fullness. Left adnexum displays no mass, no tenderness and no fullness. No vaginal discharge found.    Neurological: She is alert and oriented to person, place, and time. She displays normal reflexes. No cranial nerve deficit. She exhibits normal muscle tone. Coordination normal.  Skin: Skin is warm and dry. No rash noted. She is not diaphoretic. No erythema. No pallor.    Results for orders placed or performed during the hospital encounter of 07/10/17 (from the past 72 hour(s))  POCT urinalysis dip (device)     Status: Abnormal   Collection Time: 07/10/17  6:58 PM  Result Value Ref Range   Glucose, UA NEGATIVE NEGATIVE mg/dL   Bilirubin Urine NEGATIVE NEGATIVE   Ketones, ur NEGATIVE NEGATIVE mg/dL   Specific Gravity, Urine 1.010 1.005 - 1.030   Hgb urine dipstick TRACE (A) NEGATIVE   pH 7.0 5.0 - 8.0   Protein, ur NEGATIVE NEGATIVE mg/dL   Urobilinogen, UA 0.2 0.0 - 1.0 mg/dL   Nitrite NEGATIVE NEGATIVE   Leukocytes, UA  NEGATIVE NEGATIVE    Comment: Biochemical Testing Only. Please order routine urinalysis from main lab if confirmatory testing is needed.    No results found.  ASSESSMENT AND PLAN:  Orders Placed This Encounter  Procedures  . Urine culture    Standing Status:   Standing    Number of Occurrences:   1  . POCT urinalysis dip (device)    Standing Status:   Standing    Number of Occurrences:   1     Cervical motion tenderness: Patient with multiple symptoms.  Questionable UTI and given that she is already taking Keflex her urine today cannot be trusted.  I will culture however I would even question a negative result.  Given that she does not have any pain I am going to start her on nitrofurantoin to cover for a cystitis.  I am most concerned that she is having cervical motion tenderness.  Will screen for gonorrhea and chlamydia.  I am going to send her to OB/GYN for this.  Influenza-like illness: Patient requesting medication for the flu given body aches and new onset headache.  Advised that I did not think this is the however she is rather persistent and would like to be treated.  Tamiflu is fairly low risk go ahead and prescribe given body aches and headache.      The patient is advised to call or return to clinic if she does not see an improvement in symptoms, or to seek the care of the closest emergency department if she worsens with the above plan.   Philis Fendt, MHS, PA-C 07/10/2017 8:12 PM   Tereasa Coop, PA-C 07/10/17 2020

## 2017-07-10 NOTE — ED Notes (Signed)
Assisted Philis Fendt PA with pelvic exam

## 2017-07-10 NOTE — ED Triage Notes (Signed)
Pt here for continued fever, body ahces after having a stomach virus and UTI. sts that she was only taking 1 keflex a day vs 2 and she is still not feeling well. sts lower abd pain, weakness and dizziness.

## 2017-07-12 ENCOUNTER — Emergency Department (HOSPITAL_COMMUNITY): Payer: Self-pay

## 2017-07-12 ENCOUNTER — Encounter (HOSPITAL_COMMUNITY): Payer: Self-pay | Admitting: Emergency Medicine

## 2017-07-12 ENCOUNTER — Emergency Department (HOSPITAL_COMMUNITY)
Admission: EM | Admit: 2017-07-12 | Discharge: 2017-07-12 | Disposition: A | Discharge status: Home / Self Care | Payer: Self-pay | Attending: Emergency Medicine | Admitting: Emergency Medicine

## 2017-07-12 DIAGNOSIS — Z87891 Personal history of nicotine dependence: Secondary | ICD-10-CM | POA: Insufficient documentation

## 2017-07-12 DIAGNOSIS — J111 Influenza due to unidentified influenza virus with other respiratory manifestations: Secondary | ICD-10-CM

## 2017-07-12 DIAGNOSIS — K521 Toxic gastroenteritis and colitis: Secondary | ICD-10-CM

## 2017-07-12 DIAGNOSIS — R69 Illness, unspecified: Secondary | ICD-10-CM

## 2017-07-12 DIAGNOSIS — R05 Cough: Secondary | ICD-10-CM | POA: Insufficient documentation

## 2017-07-12 DIAGNOSIS — T578X1A Toxic effect of other specified inorganic substances, accidental (unintentional), initial encounter: Secondary | ICD-10-CM | POA: Insufficient documentation

## 2017-07-12 DIAGNOSIS — I10 Essential (primary) hypertension: Secondary | ICD-10-CM | POA: Insufficient documentation

## 2017-07-12 DIAGNOSIS — Z79899 Other long term (current) drug therapy: Secondary | ICD-10-CM | POA: Insufficient documentation

## 2017-07-12 DIAGNOSIS — T3695XA Adverse effect of unspecified systemic antibiotic, initial encounter: Secondary | ICD-10-CM | POA: Insufficient documentation

## 2017-07-12 LAB — URINALYSIS, ROUTINE W REFLEX MICROSCOPIC
Bilirubin Urine: NEGATIVE
GLUCOSE, UA: NEGATIVE mg/dL
Hgb urine dipstick: NEGATIVE
Ketones, ur: 20 mg/dL — AB
LEUKOCYTES UA: NEGATIVE
Nitrite: NEGATIVE
PH: 7 (ref 5.0–8.0)
Protein, ur: NEGATIVE mg/dL
Specific Gravity, Urine: 1.017 (ref 1.005–1.030)

## 2017-07-12 LAB — COMPREHENSIVE METABOLIC PANEL
ALT: 17 U/L (ref 14–54)
AST: 19 U/L (ref 15–41)
Albumin: 3.3 g/dL — ABNORMAL LOW (ref 3.5–5.0)
Alkaline Phosphatase: 83 U/L (ref 38–126)
Anion gap: 6 (ref 5–15)
BUN: <5 mg/dL — ABNORMAL LOW (ref 6–20)
CHLORIDE: 109 mmol/L (ref 101–111)
CO2: 25 mmol/L (ref 22–32)
Calcium: 8.5 mg/dL — ABNORMAL LOW (ref 8.9–10.3)
Creatinine, Ser: 0.48 mg/dL (ref 0.44–1.00)
Glucose, Bld: 82 mg/dL (ref 65–99)
POTASSIUM: 3.3 mmol/L — AB (ref 3.5–5.1)
SODIUM: 140 mmol/L (ref 135–145)
Total Bilirubin: <0.1 mg/dL — ABNORMAL LOW (ref 0.3–1.2)
Total Protein: 6.4 g/dL — ABNORMAL LOW (ref 6.5–8.1)

## 2017-07-12 LAB — LIPASE, BLOOD: LIPASE: 18 U/L (ref 11–51)

## 2017-07-12 LAB — CBC
HEMATOCRIT: 27.2 % — AB (ref 36.0–46.0)
Hemoglobin: 8.6 g/dL — ABNORMAL LOW (ref 12.0–15.0)
MCH: 26.9 pg (ref 26.0–34.0)
MCHC: 31.6 g/dL (ref 30.0–36.0)
MCV: 85 fL (ref 78.0–100.0)
Platelets: 493 10*3/uL — ABNORMAL HIGH (ref 150–400)
RBC: 3.2 MIL/uL — AB (ref 3.87–5.11)
RDW: 22 % — ABNORMAL HIGH (ref 11.5–15.5)
WBC: 6.1 10*3/uL (ref 4.0–10.5)

## 2017-07-12 LAB — I-STAT BETA HCG BLOOD, ED (MC, WL, AP ONLY): I-stat hCG, quantitative: <5 m[IU]/mL (ref <5)

## 2017-07-12 LAB — I-STAT CG4 LACTIC ACID, ED: LACTIC ACID, VENOUS: 0.53 mmol/L (ref 0.5–1.9)

## 2017-07-12 LAB — URINE CULTURE: Culture: NO GROWTH

## 2017-07-12 MED ORDER — KETOROLAC TROMETHAMINE 15 MG/ML IJ SOLN
15.0000 mg | Freq: Once | INTRAMUSCULAR | Status: AC
Start: 2017-07-12 — End: 2017-07-12
  Administered 2017-07-12: 15 mg via INTRAVENOUS
  Filled 2017-07-12: qty 1

## 2017-07-12 MED ORDER — SUCRALFATE 1 G PO TABS: 1.0000 g | ORAL_TABLET | Freq: Three times a day (TID) | ORAL | 0 refills | Status: DC

## 2017-07-12 MED ORDER — SODIUM CHLORIDE 0.9 % IV BOLUS (SEPSIS)
1000.0000 mL | Freq: Once | INTRAVENOUS | Status: AC
  Administered 2017-07-12: 1000 mL via INTRAVENOUS

## 2017-07-12 MED ORDER — SODIUM CHLORIDE 0.9 % IV BOLUS (SEPSIS): 1000.0000 mL | Freq: Once | INTRAVENOUS | Status: DC

## 2017-07-12 MED ORDER — DICYCLOMINE HCL 20 MG PO TABS: 20.0000 mg | ORAL_TABLET | Freq: Three times a day (TID) | ORAL | 0 refills | Status: DC

## 2017-07-12 MED ORDER — DICYCLOMINE HCL 10 MG PO CAPS
20.0000 mg | ORAL_CAPSULE | Freq: Once | ORAL | Status: AC
  Administered 2017-07-12: 20 mg via ORAL
  Filled 2017-07-12: qty 2

## 2017-07-12 MED ORDER — IOPAMIDOL (ISOVUE-300) INJECTION 61%
INTRAVENOUS | Status: AC
  Administered 2017-07-12: 100 mL via INTRAVENOUS
  Filled 2017-07-12: qty 100

## 2017-07-12 MED ORDER — OMEPRAZOLE 20 MG PO CPDR: 20.0000 mg | DELAYED_RELEASE_CAPSULE | Freq: Every day | ORAL | 0 refills | Status: DC

## 2017-07-12 MED ORDER — POTASSIUM CHLORIDE CRYS ER 20 MEQ PO TBCR
40.0000 meq | EXTENDED_RELEASE_TABLET | Freq: Once | ORAL | Status: AC
  Administered 2017-07-12: 40 meq via ORAL
  Filled 2017-07-12: qty 2

## 2017-07-12 MED ORDER — ONDANSETRON HCL 4 MG/2ML IJ SOLN
4.0000 mg | Freq: Once | INTRAMUSCULAR | Status: AC
Start: 2017-07-12 — End: 2017-07-12
  Administered 2017-07-12: 4 mg via INTRAVENOUS
  Filled 2017-07-12: qty 2

## 2017-07-12 MED ORDER — LACTATED RINGERS IV BOLUS (SEPSIS)
1000.0000 mL | Freq: Once | INTRAVENOUS | Status: AC
  Administered 2017-07-12: 1000 mL via INTRAVENOUS

## 2017-07-12 NOTE — ED Provider Notes (Signed)
[facility] DEPT Provider Note   CSN: [id number] Arrival date & time: 07/12/17  1352     History   Chief Complaint Chief Complaint  Patient presents with  . Abdominal Pain  . Flu Like Symptoms    HPI Shannon Cervantes is a 47 y.o. female.  HPI   47 year old female with past medical history as below here with generalized fatigue.  The patient states that over the last 2 weeks, she has had multiple illnesses.  2 weeks ago, she began to have nausea, vomiting, and diarrhea.  She was seen at urgent care and was prescribed Keflex for a UTI.  She has been taking this.  However, over the last several days, she had recurrence of nausea, vomiting, and diarrhea.  She also developed subjective fevers and body aches.  She read presented to urgent care and was switched to [place].  She is also empirically started on Tamiflu.  Since then, she has had persistently worsening diarrhea.  Denies any lower abdominal pain.  She has a mild, aching, burning epigastric and left upper quadrant pain.  She is also had generalized body fatigue and has had difficulty eating and drinking.  Denies any cough.  No dysuria.  No vaginal discharge.  No rashes.  She does have multiple sick contacts.  Of note, she was also reportedly positive for cervical motion tenderness at urgent care and was treated empirically for this.  Past Medical History:  Diagnosis Date  . Alcohol abuse   . Anxiety   . Depression   . Foot fracture 01-07-2011  . Hyperlipidemia   . Hypertensive cardiomyopathy [facility])     Patient Active Problem List   Diagnosis Date Noted  . Low hemoglobin 02/15/2017  . Gastric cancer ([place]) 02/15/2017  . Alcohol use disorder, moderate, dependence ([place]) 02/15/2017  . Viral gastroenteritis 01/11/2015  . Needle stick injury of finger 12/11/2014  . Essential hypertension 12/11/2014  . Cough 12/11/2014  . Major depressive disorder, recurrent severe without psychotic features ([place])  11/13/2014  . PTSD (post-traumatic stress disorder) 11/13/2014  . Alcohol use disorder, severe, dependence ([place]) 11/12/2014  . Suicidal ideation 11/12/2014  . 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 Chest pain 11/02/2013  . Obesity 08/21/2011  . Hypertensive cardiomyopathy ([place]) 08/20/2011    Past Surgical History:  Procedure Laterality Date  . CESAREAN SECTION    . GASTRIC BYPASS      OB History    No data available       Home Medications    Prior to Admission medications   Medication Sig Start Date End Date Taking? Authorizing Provider  acetaminophen (TYLENOL) 500 MG tablet Take 1,000 mg by mouth every 6 (six) hours as needed for moderate pain.   Yes [provider]  buPROPion (WELLBUTRIN) 75 MG tablet Take 75 mg by mouth 2 (two) times daily.   Yes [provider]  estazolam (PROSOM) 2 MG tablet Take 2 mg by mouth at bedtime.   Yes [provider]  ibuprofen (ADVIL,MOTRIN) 200 MG tablet Take 600 mg by mouth every 6 (six) hours as needed for moderate pain.   Yes [provider]  loperamide (IMODIUM) 2 MG capsule Take 2 mg by mouth as needed for diarrhea or loose stools.   Yes [provider]  LORazepam (ATIVAN) 0.5 MG tablet Take 0.5 mg by mouth daily as needed for anxiety.   Yes [provider]  naphazoline-glycerin (CLEAR EYES REDNESS) 0.012-0.2 % SOLN Place 1-2 drops  into both eyes 4 (four) times daily as needed for eye irritation.   Yes [provider]  nitrofurantoin, macrocrystal-monohydrate, (MACROBID) 100 MG capsule Take 1 capsule (100 mg total) by mouth 2 (two) times daily. Stop taking Keflex. 07/10/17  Yes Tereasa Coop, PA-C  ondansetron (ZOFRAN) 4 MG tablet Take 1 tablet (4 mg total) by mouth every 6 (six) hours. 07/10/17  Yes Tereasa Coop, PA-C  oseltamivir (TAMIFLU) 75 MG capsule Take 1 capsule (75 mg total) by mouth every 12 (twelve) hours. 07/10/17  Yes Tereasa Coop, PA-C    pantoprazole (PROTONIX) 40 MG tablet Take 1 tablet (40 mg total) by mouth daily. 02/26/17  Yes Money, Lowry Ram, FNP  ranitidine (ZANTAC) 150 MG tablet Take 150 mg by mouth at bedtime.   Yes [provider]  sertraline (ZOLOFT) 50 MG tablet Take 50 mg by mouth daily.   Yes [provider]  dicyclomine (BENTYL) 20 MG tablet Take 1 tablet (20 mg total) by mouth 4 (four) times daily -  before meals and at bedtime for 10 days. 07/12/17 07/22/17  Duffy Bruce, MD  escitalopram (LEXAPRO) 10 MG tablet Take 1 tablet (10 mg total) by mouth daily. For mood control Patient not taking: Reported on 07/12/2017 02/26/17   Money, Lowry Ram, FNP  ferrous sulfate 325 (65 FE) MG tablet Take 1 tablet (325 mg total) by mouth daily with breakfast. Patient not taking: Reported on 07/12/2017 02/26/17   Money, Lowry Ram, FNP  hydrOXYzine (ATARAX/VISTARIL) 25 MG tablet Take 1 tablet (25 mg total) by mouth every 6 (six) hours as needed for anxiety (insomnia). Patient not taking: Reported on 07/12/2017 02/25/17   Money, Lowry Ram, FNP  omeprazole (PRILOSEC) 20 MG capsule Take 1 capsule (20 mg total) by mouth daily for 14 days. 07/12/17 07/26/17  Duffy Bruce, MD  sucralfate (CARAFATE) 1 g tablet Take 1 tablet (1 g total) by mouth 4 (four) times daily -  with meals and at bedtime for 10 days. 07/12/17 07/22/17  Duffy Bruce, MD    Family History Family History  Problem Relation Age of Onset  . Hypertension Mother   . Heart disease Mother   . Hyperlipidemia Mother   . Depression Mother   . Colon cancer Mother   . Heart disease Father   . Depression Father   . Renal Disease Father   . Alcohol abuse Brother   . Alcohol abuse Paternal Uncle     Social History Social History   Tobacco Use  . Smoking status: Former Smoker    Packs/day: 0.25    Types: Cigarettes  . Smokeless tobacco: Never Used  Substance Use Topics  . Alcohol use: No    Frequency: Never    Comment: none since jan 5th  . Drug use: No      Allergies   Patient has no known allergies.   Review of Systems Review of Systems  Constitutional: Positive for chills, fatigue and fever.  Gastrointestinal: Positive for abdominal pain, diarrhea, nausea and vomiting.  Neurological: Positive for weakness.  All other systems reviewed and are negative.    Physical Exam Updated Vital Signs BP 108/78   Pulse 74   Temp 98.8 F (37.1 C) (Oral)   Resp 18   Ht 5\' 4"  (1.626 m)   Wt 72.6 kg (160 lb)   SpO2 100%   BMI 27.46 kg/m   Physical Exam  Constitutional: She is oriented to person, place, and time. She appears well-developed and well-nourished. No  distress.  HENT:  Head: Normocephalic and atraumatic.  Mildly dry mucous membranes  Eyes: Conjunctivae are normal.  Neck: Neck supple.  Cardiovascular: Normal rate, regular rhythm and normal heart sounds. Exam reveals no friction rub.  No murmur heard. Pulmonary/Chest: Effort normal and breath sounds normal. No respiratory distress. She has no wheezes. She has no rales.  Abdominal: She exhibits no distension.  Increased bowel sounds.  Mild tenderness in the epigastric and left upper quadrant.  No rebound or guarding.  Musculoskeletal: She exhibits no edema.  Neurological: She is alert and oriented to person, place, and time. She exhibits normal muscle tone.  Skin: Skin is warm. Capillary refill takes less than 2 seconds.  Psychiatric: She has a normal mood and affect.  Nursing note and vitals reviewed.    ED Treatments / Results  Labs (all labs ordered are listed, but only abnormal results are displayed) Labs Reviewed  COMPREHENSIVE METABOLIC PANEL - Abnormal; Notable for the following components:      Result Value   Potassium 3.3 (*)    BUN <5 (*)    Calcium 8.5 (*)    Total Protein 6.4 (*)    Albumin 3.3 (*)    Total Bilirubin <0.1 (*)    All other components within normal limits  CBC - Abnormal; Notable for the following components:   RBC 3.20 (*)     Hemoglobin 8.6 (*)    HCT 27.2 (*)    RDW 22.0 (*)    Platelets 493 (*)    All other components within normal limits  URINALYSIS, ROUTINE W REFLEX MICROSCOPIC - Abnormal; Notable for the following components:   Ketones, ur 20 (*)    All other components within normal limits  LIPASE, BLOOD  I-STAT BETA HCG BLOOD, ED ([facility], [facility], [facility] ONLY)  I-STAT CG4 LACTIC ACID, ED    EKG  EKG Interpretation None       Radiology Ct Abdomen Pelvis W Contrast  Result Date: 07/12/2017 CLINICAL DATA:  Nausea and vomiting EXAM: CT ABDOMEN AND PELVIS WITH CONTRAST TECHNIQUE: Multidetector CT imaging of the abdomen and pelvis was performed using the standard protocol following bolus administration of intravenous contrast. CONTRAST:  100 cc Isovue-300 intravenous COMPARISON:  Radiographs 07/12/2017, CT abdomen pelvis 01/17/2015 FINDINGS: Lower chest: Lung bases demonstrate no acute consolidation or pleural effusion. Heart size is normal Hepatobiliary: Hypodensity near the falciform ligament, suspect prominent focal fat infiltration. No calcified gallstones or biliary dilatation Pancreas: Unremarkable. No pancreatic ductal dilatation or surrounding inflammatory changes. Spleen: Normal in size without focal abnormality. Adrenals/Urinary Tract: Adrenal glands are unremarkable. Kidneys are without hydronephrosis. Subcentimeter hypodensity in the mid to lower left kidney too small to further characterize, probably a small cyst. The bladder is within normal limits Stomach/Bowel: Status post gastric bypass. No evidence for bowel obstruction. No bowel wall thickening. Appendix is not definitively visualized but no right lower quadrant inflammatory process is identified. Vascular/Lymphatic: No significant vascular findings are present. No enlarged abdominal or pelvic lymph nodes. Reproductive: Uterus and bilateral adnexa are unremarkable. Other: Tiny free fluid in the pelvis.  No free air Musculoskeletal: Degenerative changes. No  acute or suspicious bone lesion IMPRESSION: 1. No CT evidence for acute intra-abdominal or pelvic abnormality 2. Status post gastric bypass with no evidence for an obstruction 3. Trace free fluid in the pelvis Electronically Signed   By: Donavan Foil M.D.   On: 07/12/2017 21:14   Dg Abdomen Acute W/chest  Result Date: 07/12/2017 CLINICAL DATA:  Abdominal pain, cough  EXAM: DG ABDOMEN ACUTE W/ 1V CHEST COMPARISON:  CT abdomen pelvis 01/17/2015 FINDINGS: There is no evidence of dilated bowel loops or free intraperitoneal air. Prior gastric surgery. No radiopaque calculi or other significant radiographic abnormality is seen. Heart size and mediastinal contours are within normal limits. Both lungs are clear. IMPRESSION: Negative abdominal radiographs.  No acute cardiopulmonary disease. Electronically Signed   By: Franchot Gallo M.D.   On: 07/12/2017 18:37    Procedures Procedures (including critical care time)  Medications Ordered in ED Medications  sodium chloride 0.9 % bolus 1,000 mL (0 mLs Intravenous Stopped 07/12/17 1952)  ondansetron (ZOFRAN) injection 4 mg (4 mg Intravenous Given 07/12/17 1832)  dicyclomine (BENTYL) capsule 20 mg (20 mg Oral Given 07/12/17 1835)  ketorolac (TORADOL) 15 MG/ML injection 15 mg (15 mg Intravenous Given 07/12/17 1951)  lactated ringers bolus 1,000 mL (0 mLs Intravenous Stopped 07/12/17 2237)  iopamidol (ISOVUE-300) 61 % injection (100 mLs Intravenous Contrast Given 07/12/17 2041)  potassium chloride SA (K-DUR,KLOR-CON) CR tablet 40 mEq (40 mEq Oral Given 07/12/17 2146)     Initial Impression / Assessment and Plan / ED Course  I have reviewed the triage vital signs and the nursing notes.  Pertinent labs & imaging results that were available during my care of the patient were reviewed by me and considered in my medical decision making (see chart for details).    47 year old female here with nausea, vomiting, and diarrhea in the setting of recent antibiotic use.  On  exam, her abdomen is very soft.  Lab work shows mild dehydration and hypokalemia which has been replaced.  I had a long discussion with her and she did complain of intermittent lower abdominal pain, with some tenderness on my serial exams, so CT scan obtained and is fortunately negative.  I suspect her symptoms are due to antibiotic associated GI upset.  She is currently on treatment for a possible UTI, but culture was negative and she is already received 5 days of treatment.  I do not suspect that patient needs ongoing treatment.  Moreover, she is on Tamiflu but has no current flulike symptoms, and given that her worsening GI symptoms correlated with onset, I feel it is reasonable for her to stop this.  I will recommend probiotics, supportive care, and outpatient follow-up.  Final Clinical Impressions(s) / ED Diagnoses   Final diagnoses:  Influenza-like illness  Antibiotic-associated diarrhea    ED Discharge Orders        Ordered    dicyclomine (BENTYL) 20 MG tablet  3 times daily before meals & bedtime     07/12/17 2200    omeprazole (PRILOSEC) 20 MG capsule  Daily     07/12/17 2200    sucralfate (CARAFATE) 1 g tablet  3 times daily with meals & bedtime     07/12/17 2200       Duffy Bruce, MD 07/12/17 2334

## 2017-07-12 NOTE — ED Triage Notes (Signed)
Pt comes in with complaints of recurrent flu like symptoms.  Has been taking tamiflu and antibiotics for a UTI.  States today she got the body aches back and has continued to have diarrhea. Endorses abdominal pain that is generalized through the upper quadrant. Afebrile on assessment.

## 2017-07-12 NOTE — ED Notes (Signed)
Pt verbalized understanding of discharge instructions.  Ambulatory and independent at discharge.

## 2017-07-12 NOTE — Discharge Instructions (Signed)
I recommend that you start taking probiotics.  Any over-the-counter probiotic will work.  Generic probiotics should work just as well.  Take this at least once daily for the next 2 weeks.

## 2017-07-12 NOTE — ED Notes (Signed)
ED Provider at bedside. 

## 2017-07-12 NOTE — ED Notes (Signed)
Patient transported to X-ray 

## 2017-07-13 LAB — CERVICOVAGINAL ANCILLARY ONLY
Bacterial vaginitis: NEGATIVE
CANDIDA VAGINITIS: NEGATIVE
CHLAMYDIA, DNA PROBE: NEGATIVE
NEISSERIA GONORRHEA: NEGATIVE
Trichomonas: NEGATIVE

## 2017-07-15 NOTE — Progress Notes (Signed)
This is very helpful.  I think stopping the antibiotic at this point is the most reasonable step.  I will send a message to the clinical staff to advise that she stop taking the Keflex and to follow-up with [place] or her primary care provider in the next 2-3 days if she remains symptomatic.  Thanks for your guidance.    Clinical staff please contact the patient by phone and advised that her workup was largely negative.  It is best that she stop taking Keflex at this point.  I suggest that she come back if she remains symptomatic or seek the care of her primary care provider.

## 2018-04-16 ENCOUNTER — Emergency Department (HOSPITAL_COMMUNITY)
Admission: EM | Admit: 2018-04-16 | Discharge: 2018-04-16 | Disposition: A | Discharge status: Home / Self Care | Payer: Self-pay | Attending: Emergency Medicine | Admitting: Emergency Medicine

## 2018-04-16 ENCOUNTER — Encounter (HOSPITAL_COMMUNITY): Payer: Self-pay | Admitting: Emergency Medicine

## 2018-04-16 DIAGNOSIS — Y999 Unspecified external cause status: Secondary | ICD-10-CM | POA: Insufficient documentation

## 2018-04-16 DIAGNOSIS — Z79899 Other long term (current) drug therapy: Secondary | ICD-10-CM | POA: Insufficient documentation

## 2018-04-16 DIAGNOSIS — D002 Carcinoma in situ of stomach: Secondary | ICD-10-CM | POA: Insufficient documentation

## 2018-04-16 DIAGNOSIS — Y929 Unspecified place or not applicable: Secondary | ICD-10-CM | POA: Insufficient documentation

## 2018-04-16 DIAGNOSIS — W1789XA Other fall from one level to another, initial encounter: Secondary | ICD-10-CM | POA: Insufficient documentation

## 2018-04-16 DIAGNOSIS — I10 Essential (primary) hypertension: Secondary | ICD-10-CM | POA: Insufficient documentation

## 2018-04-16 DIAGNOSIS — Y939 Activity, unspecified: Secondary | ICD-10-CM | POA: Insufficient documentation

## 2018-04-16 DIAGNOSIS — S0083XA Contusion of other part of head, initial encounter: Secondary | ICD-10-CM | POA: Insufficient documentation

## 2018-04-16 DIAGNOSIS — W19XXXA Unspecified fall, initial encounter: Secondary | ICD-10-CM

## 2018-04-16 DIAGNOSIS — F17210 Nicotine dependence, cigarettes, uncomplicated: Secondary | ICD-10-CM | POA: Insufficient documentation

## 2018-04-16 NOTE — ED Notes (Signed)
Patient refusing to stay on monitor, keeps taking everything off and getting gout of bed as soon as staff leaves her room. Patient found wandering around to other pods trying to get out. Redirected patient back to room, Dr Roderic Palau spoke tro patien and patient wants to leave AMA.

## 2018-04-16 NOTE — ED Provider Notes (Signed)
[facility] EMERGENCY DEPARTMENT Provider Note   CSN: [id number] Arrival date & time: 04/16/18  1759     History   Chief Complaint Chief Complaint  Patient presents with  . Fall    HPI Shannon Cervantes is a 47 y.o. female.  Patient has history of EtOH abuse.  Patient with at home with family and fell and hit her head cholesterol loss of consciousness.  Family called EMS to bring her to the emergency department  The history is provided by the patient and the EMS personnel. No language interpreter was used.  Fall  This is a recurrent problem. The problem occurs rarely. The problem has been resolved. Pertinent negatives include no chest pain, no abdominal pain and no headaches. Nothing aggravates the symptoms. Nothing relieves the symptoms.    Past Medical History:  Diagnosis Date  . Alcohol abuse   . Anxiety   . Depression   . Foot fracture 01-07-2011  . Hyperlipidemia   . Hypertensive cardiomyopathy [facility])     Patient Active Problem List   Diagnosis Date Noted  . Low hemoglobin 02/15/2017  . Gastric cancer ([place]) 02/15/2017  . Alcohol use disorder, moderate, dependence ([place]) 02/15/2017  . Viral gastroenteritis 01/11/2015  . Needle stick injury of finger 12/11/2014  . Essential hypertension 12/11/2014  . Cough 12/11/2014  . Major depressive disorder, recurrent severe without psychotic features ([place]) 11/13/2014  . PTSD (post-traumatic stress disorder) 11/13/2014  . Alcohol use disorder, severe, dependence ([place]) 11/12/2014  . Suicidal ideation 11/12/2014  . 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) 03/31/2014  . Chest pain 11/02/2013  . Obesity 08/21/2011  . Hypertensive cardiomyopathy ([place]) 08/20/2011    Past Surgical History:  Procedure Laterality Date  . CESAREAN SECTION    . GASTRIC BYPASS       OB History   None      Home Medications    Prior to Admission medications   Medication Sig Start Date End Date Taking? Authorizing Provider    acetaminophen (TYLENOL) 500 MG tablet Take 1,000 mg by mouth every 6 (six) hours as needed for moderate pain.    [provider]  buPROPion (WELLBUTRIN) 75 MG tablet Take 75 mg by mouth 2 (two) times daily.    [provider]  dicyclomine (BENTYL) 20 MG tablet Take 1 tablet (20 mg total) by mouth 4 (four) times daily -  before meals and at bedtime for 10 days. 07/12/17 07/22/17  Duffy Bruce, MD  escitalopram (LEXAPRO) 10 MG tablet Take 1 tablet (10 mg total) by mouth daily. For mood control Patient not taking: Reported on 07/12/2017 02/26/17   Money, Lowry Ram, FNP  estazolam (PROSOM) 2 MG tablet Take 2 mg by mouth at bedtime.    [provider]  ferrous sulfate 325 (65 FE) MG tablet Take 1 tablet (325 mg total) by mouth daily with breakfast. Patient not taking: Reported on 07/12/2017 02/26/17   Money, Lowry Ram, FNP  hydrOXYzine (ATARAX/VISTARIL) 25 MG tablet Take 1 tablet (25 mg total) by mouth every 6 (six) hours as needed for anxiety (insomnia). Patient not taking: Reported on 07/12/2017 02/25/17   Money, Lowry Ram, FNP  ibuprofen (ADVIL,MOTRIN) 200 MG tablet Take 600 mg by mouth every 6 (six) hours as needed for moderate pain.    [provider]  loperamide (IMODIUM) 2 MG capsule Take 2 mg by mouth as needed for diarrhea or loose stools.    [provider]  LORazepam (ATIVAN) 0.5 MG tablet Take 0.5  mg by mouth daily as needed for anxiety.    [provider]  naphazoline-glycerin (CLEAR EYES REDNESS) 0.012-0.2 % SOLN Place 1-2 drops into both eyes 4 (four) times daily as needed for eye irritation.    [provider]  nitrofurantoin, macrocrystal-monohydrate, (MACROBID) 100 MG capsule Take 1 capsule (100 mg total) by mouth 2 (two) times daily. Stop taking Keflex. 07/10/17   Tereasa Coop, PA-C  omeprazole (PRILOSEC) 20 MG capsule Take 1 capsule (20 mg total) by mouth daily for 14 days. 07/12/17 07/26/17  Duffy Bruce, MD  ondansetron  (ZOFRAN) 4 MG tablet Take 1 tablet (4 mg total) by mouth every 6 (six) hours. 07/10/17   Tereasa Coop, PA-C  oseltamivir (TAMIFLU) 75 MG capsule Take 1 capsule (75 mg total) by mouth every 12 (twelve) hours. 07/10/17   Tereasa Coop, PA-C  pantoprazole (PROTONIX) 40 MG tablet Take 1 tablet (40 mg total) by mouth daily. 02/26/17   Money, Lowry Ram, FNP  ranitidine (ZANTAC) 150 MG tablet Take 150 mg by mouth at bedtime.    [provider]  sertraline (ZOLOFT) 50 MG tablet Take 50 mg by mouth daily.    [provider]  sucralfate (CARAFATE) 1 g tablet Take 1 tablet (1 g total) by mouth 4 (four) times daily -  with meals and at bedtime for 10 days. 07/12/17 07/22/17  Duffy Bruce, MD    Family History Family History  Problem Relation Age of Onset  . Hypertension Mother   . Heart disease Mother   . Hyperlipidemia Mother   . Depression Mother   . Colon cancer Mother   . Heart disease Father   . Depression Father   . Renal Disease Father   . Alcohol abuse Brother   . Alcohol abuse Paternal Uncle     Social History Social History   Tobacco Use  . Smoking status: Current Every Day Smoker    Packs/day: 0.50    Years: 20.00    Pack years: 10.00    Types: Cigarettes  . Smokeless tobacco: Never Used  Substance Use Topics  . Alcohol use: No    Frequency: Never    Comment: none since jan 5th  . Drug use: No     Allergies   Patient has no known allergies.   Review of Systems Review of Systems  Constitutional: Negative for appetite change and fatigue.  HENT: Negative for congestion, ear discharge and sinus pressure.   Eyes: Negative for discharge.  Respiratory: Negative for cough.   Cardiovascular: Negative for chest pain.  Gastrointestinal: Negative for abdominal pain and diarrhea.  Genitourinary: Negative for frequency and hematuria.  Musculoskeletal: Negative for back pain.  Skin: Negative for rash.  Neurological: Negative for seizures and headaches.   Psychiatric/Behavioral: Negative for hallucinations.     Physical Exam Updated Vital Signs BP 119/84   Temp 97.8 F (36.6 C) (Oral)   Ht 5\' 3"  (1.6 m)   Wt 68 kg   SpO2 98%   BMI 26.57 kg/m   Physical Exam  Constitutional: She appears well-developed.  HENT:  Head: Normocephalic.  Contusion to forehead  Eyes: Conjunctivae and EOM are normal. No scleral icterus.  Neck: Neck supple. No thyromegaly present.  Cardiovascular: Normal rate and regular rhythm. Exam reveals no gallop and no friction rub.  No murmur heard. Pulmonary/Chest: No stridor. She has no wheezes. She has no rales. She exhibits no tenderness.  Abdominal: She exhibits no distension. There is no tenderness. There is no  rebound.  Musculoskeletal: Normal range of motion. She exhibits no edema.  Patient is mildly ataxic  Lymphadenopathy:    She has no cervical adenopathy.  Neurological: She exhibits normal muscle tone. Coordination normal.  Patient is oriented to person and place knowing it was a hospital but this is a wrong hospital.  And date time and situation  Skin: No rash noted. No erythema.  Psychiatric: She has a normal mood and affect. Her behavior is normal.     ED Treatments / Results  Labs (all labs ordered are listed, but only abnormal results are displayed) Labs Reviewed - No data to display  EKG None  Radiology No results found.  Procedures Procedures (including critical care time)  Medications Ordered in ED Medications - No data to display   Initial Impression / Assessment and Plan / ED Course  I have reviewed the triage vital signs and the nursing notes.  Pertinent labs & imaging results that were available during my care of the patient were reviewed by me and considered in my medical decision making (see chart for details).     Patient refused any x-rays or blood test and left AMA  Final Clinical Impressions(s) / ED Diagnoses   Final diagnoses:  Fall, initial encounter     ED Discharge Orders    None       Milton Ferguson, MD 04/16/18 [phone]

## 2018-04-16 NOTE — ED Triage Notes (Signed)
Per ems patient arrived from home, patient family states she fell about an hour ago and hit her head. ETOH on board. Unsure if she has LOC. Family also states she has been drinking for 5 days straight, patient currently denying that she drinks. Patient VSS

## 2018-04-17 ENCOUNTER — Emergency Department (HOSPITAL_COMMUNITY)
Admission: EM | Admit: 2018-04-17 | Discharge: 2018-04-18 | Disposition: A | Discharge status: Other Acute Inpatient Hospital | Payer: Self-pay | Attending: Emergency Medicine | Admitting: Emergency Medicine

## 2018-04-17 ENCOUNTER — Emergency Department (HOSPITAL_COMMUNITY): Payer: Self-pay

## 2018-04-17 ENCOUNTER — Other Ambulatory Visit: Payer: Self-pay

## 2018-04-17 ENCOUNTER — Encounter (HOSPITAL_COMMUNITY): Payer: Self-pay

## 2018-04-17 DIAGNOSIS — Z79899 Other long term (current) drug therapy: Secondary | ICD-10-CM | POA: Insufficient documentation

## 2018-04-17 DIAGNOSIS — F1024 Alcohol dependence with alcohol-induced mood disorder: Secondary | ICD-10-CM | POA: Diagnosis present

## 2018-04-17 DIAGNOSIS — F1010 Alcohol abuse, uncomplicated: Secondary | ICD-10-CM

## 2018-04-17 DIAGNOSIS — S8261XA Displaced fracture of lateral malleolus of right fibula, initial encounter for closed fracture: Secondary | ICD-10-CM | POA: Insufficient documentation

## 2018-04-17 DIAGNOSIS — F1020 Alcohol dependence, uncomplicated: Secondary | ICD-10-CM | POA: Insufficient documentation

## 2018-04-17 DIAGNOSIS — Y908 Blood alcohol level of 240 mg/100 ml or more: Secondary | ICD-10-CM | POA: Insufficient documentation

## 2018-04-17 DIAGNOSIS — S0003XA Contusion of scalp, initial encounter: Secondary | ICD-10-CM | POA: Insufficient documentation

## 2018-04-17 DIAGNOSIS — F17210 Nicotine dependence, cigarettes, uncomplicated: Secondary | ICD-10-CM | POA: Insufficient documentation

## 2018-04-17 DIAGNOSIS — I10 Essential (primary) hypertension: Secondary | ICD-10-CM | POA: Insufficient documentation

## 2018-04-17 DIAGNOSIS — Y9301 Activity, walking, marching and hiking: Secondary | ICD-10-CM | POA: Insufficient documentation

## 2018-04-17 DIAGNOSIS — D002 Carcinoma in situ of stomach: Secondary | ICD-10-CM | POA: Insufficient documentation

## 2018-04-17 DIAGNOSIS — Y999 Unspecified external cause status: Secondary | ICD-10-CM | POA: Insufficient documentation

## 2018-04-17 DIAGNOSIS — X58XXXA Exposure to other specified factors, initial encounter: Secondary | ICD-10-CM | POA: Insufficient documentation

## 2018-04-17 DIAGNOSIS — F332 Major depressive disorder, recurrent severe without psychotic features: Secondary | ICD-10-CM | POA: Insufficient documentation

## 2018-04-17 DIAGNOSIS — R45851 Suicidal ideations: Secondary | ICD-10-CM

## 2018-04-17 DIAGNOSIS — Y929 Unspecified place or not applicable: Secondary | ICD-10-CM | POA: Insufficient documentation

## 2018-04-17 LAB — I-STAT BETA HCG BLOOD, ED (MC, WL, AP ONLY): I-stat hCG, quantitative: <5 m[IU]/mL (ref <5)

## 2018-04-17 LAB — CBC
HEMATOCRIT: 38.6 % (ref 36.0–46.0)
HEMOGLOBIN: 13 g/dL (ref 12.0–15.0)
MCH: 32.1 pg (ref 26.0–34.0)
MCHC: 33.7 g/dL (ref 30.0–36.0)
MCV: 95.3 fL (ref 80.0–100.0)
Platelets: 281 10*3/uL (ref 150–400)
RBC: 4.05 MIL/uL (ref 3.87–5.11)
RDW: 13.7 % (ref 11.5–15.5)
WBC: 10.6 10*3/uL — ABNORMAL HIGH (ref 4.0–10.5)
nRBC: 0 % (ref 0.0–0.2)

## 2018-04-17 LAB — COMPREHENSIVE METABOLIC PANEL
ALK PHOS: 106 U/L (ref 38–126)
ALT: 41 U/L (ref 0–44)
AST: 80 U/L — ABNORMAL HIGH (ref 15–41)
Albumin: 4.4 g/dL (ref 3.5–5.0)
Anion gap: 11 (ref 5–15)
BUN: 7 mg/dL (ref 6–20)
CALCIUM: 8.4 mg/dL — AB (ref 8.9–10.3)
CHLORIDE: 109 mmol/L (ref 98–111)
CO2: 22 mmol/L (ref 22–32)
CREATININE: 0.53 mg/dL (ref 0.44–1.00)
GFR calc Af Amer: >60 mL/min (ref >60)
GFR calc non Af Amer: >60 mL/min (ref >60)
Glucose, Bld: 94 mg/dL (ref 70–99)
Potassium: 4.1 mmol/L (ref 3.5–5.1)
SODIUM: 142 mmol/L (ref 135–145)
Total Bilirubin: 0.8 mg/dL (ref 0.3–1.2)
Total Protein: 7.8 g/dL (ref 6.5–8.1)

## 2018-04-17 LAB — SALICYLATE LEVEL: Salicylate Lvl: <7 mg/dL (ref 2.8–30.0)

## 2018-04-17 LAB — ACETAMINOPHEN LEVEL: Acetaminophen (Tylenol), Serum: <10 ug/mL — ABNORMAL LOW (ref 10–30)

## 2018-04-17 LAB — ETHANOL: Alcohol, Ethyl (B): 375 mg/dL (ref <10)

## 2018-04-17 MED ORDER — ONDANSETRON 4 MG PO TBDP
4.0000 mg | ORAL_TABLET | Freq: Once | ORAL | Status: AC
  Administered 2018-04-17: 4 mg via ORAL
  Filled 2018-04-17: qty 1

## 2018-04-17 MED ORDER — IBUPROFEN 200 MG PO TABS
600.0000 mg | ORAL_TABLET | Freq: Four times a day (QID) | ORAL | Status: DC | PRN
  Administered 2018-04-18: 600 mg via ORAL
  Filled 2018-04-17: qty 3

## 2018-04-17 MED ORDER — NAPROXEN 500 MG PO TABS
500.0000 mg | ORAL_TABLET | Freq: Once | ORAL | Status: AC
  Administered 2018-04-17: 500 mg via ORAL
  Filled 2018-04-17: qty 1

## 2018-04-17 MED ORDER — VITAMIN B-1 100 MG PO TABS
100.0000 mg | ORAL_TABLET | Freq: Every day | ORAL | Status: DC
  Administered 2018-04-18: 100 mg via ORAL
  Filled 2018-04-17: qty 1

## 2018-04-17 MED ORDER — LORAZEPAM 2 MG/ML IJ SOLN: 0.0000 mg | Freq: Two times a day (BID) | INTRAMUSCULAR | Status: DC

## 2018-04-17 MED ORDER — LORAZEPAM 1 MG PO TABS
0.0000 mg | ORAL_TABLET | Freq: Four times a day (QID) | ORAL | Status: DC
  Administered 2018-04-17 – 2018-04-18 (×3): 1 mg via ORAL
  Filled 2018-04-17 (×3): qty 1

## 2018-04-17 MED ORDER — BUPROPION HCL 75 MG PO TABS
75.0000 mg | ORAL_TABLET | Freq: Two times a day (BID) | ORAL | Status: DC
  Filled 2018-04-17 (×3): qty 1

## 2018-04-17 MED ORDER — THIAMINE HCL 100 MG/ML IJ SOLN: 100.0000 mg | Freq: Every day | INTRAMUSCULAR | Status: DC

## 2018-04-17 MED ORDER — LORAZEPAM 1 MG PO TABS: 0.0000 mg | ORAL_TABLET | Freq: Two times a day (BID) | ORAL | Status: DC

## 2018-04-17 MED ORDER — DICYCLOMINE HCL 20 MG PO TABS
20.0000 mg | ORAL_TABLET | Freq: Three times a day (TID) | ORAL | Status: DC
  Administered 2018-04-18 (×2): 20 mg via ORAL
  Filled 2018-04-17 (×2): qty 1

## 2018-04-17 MED ORDER — LORAZEPAM 2 MG/ML IJ SOLN: 0.0000 mg | Freq: Four times a day (QID) | INTRAMUSCULAR | Status: DC

## 2018-04-17 MED ORDER — ONDANSETRON HCL 4 MG PO TABS: 4.0000 mg | ORAL_TABLET | Freq: Four times a day (QID) | ORAL | Status: DC

## 2018-04-17 MED ORDER — SERTRALINE HCL 50 MG PO TABS
50.0000 mg | ORAL_TABLET | Freq: Every day | ORAL | Status: DC
  Filled 2018-04-17: qty 1
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
## 2018-04-17 NOTE — ED Notes (Signed)
Shannon Sarna, PA-C informed of pt's ETOH level of 375.

## 2018-04-17 NOTE — ED Notes (Signed)
Patient transported to X-ray 

## 2018-04-17 NOTE — ED Triage Notes (Signed)
PT arrives with [employer] under IVC. Papers state, "Currently on a drinking binge. Family located her with 5 bottles of a 5th of Ciroc and several Four Locos. Respondent was recently fired from her job due to her excessive drinking. Respondent hasn't gotten out of bed for a week. She isnt eating, tending to personal hygiene or sleeping. Yesterday, she was transported to the hospital because she walked out in the middle of traffic on [address] and fell. She received a knot on her head and several bruises on her body."

## 2018-04-17 NOTE — Progress Notes (Signed)
Pt current BAL 375. TTS unable to assess at this time.
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
## 2018-04-17 NOTE — ED Notes (Signed)
Bed: LK95 Expected date:  Expected time:  Means of arrival:  Comments: IVC that didn't call ahead

## 2018-04-18 ENCOUNTER — Encounter (HOSPITAL_COMMUNITY): Payer: Self-pay

## 2018-04-18 ENCOUNTER — Inpatient Hospital Stay (HOSPITAL_COMMUNITY)
Admission: AD | Admit: 2018-04-18 | Discharge: 2018-04-24 | DRG: 885 | Disposition: A | Discharge status: Home / Self Care | Payer: Federal, State, Local not specified - Other | Attending: Psychiatry | Admitting: Psychiatry

## 2018-04-18 DIAGNOSIS — Z818 Family history of other mental and behavioral disorders: Secondary | ICD-10-CM | POA: Diagnosis not present

## 2018-04-18 DIAGNOSIS — Z800 Family history of malignant neoplasm of digestive organs: Secondary | ICD-10-CM

## 2018-04-18 DIAGNOSIS — R569 Unspecified convulsions: Secondary | ICD-10-CM | POA: Diagnosis present

## 2018-04-18 DIAGNOSIS — F1024 Alcohol dependence with alcohol-induced mood disorder: Secondary | ICD-10-CM | POA: Diagnosis present

## 2018-04-18 DIAGNOSIS — F322 Major depressive disorder, single episode, severe without psychotic features: Secondary | ICD-10-CM | POA: Diagnosis not present

## 2018-04-18 DIAGNOSIS — E785 Hyperlipidemia, unspecified: Secondary | ICD-10-CM | POA: Diagnosis present

## 2018-04-18 DIAGNOSIS — Z8349 Family history of other endocrine, nutritional and metabolic diseases: Secondary | ICD-10-CM | POA: Diagnosis not present

## 2018-04-18 DIAGNOSIS — K7010 Alcoholic hepatitis without ascites: Secondary | ICD-10-CM | POA: Diagnosis not present

## 2018-04-18 DIAGNOSIS — F10239 Alcohol dependence with withdrawal, unspecified: Secondary | ICD-10-CM | POA: Diagnosis not present

## 2018-04-18 DIAGNOSIS — Z85028 Personal history of other malignant neoplasm of stomach: Secondary | ICD-10-CM | POA: Diagnosis not present

## 2018-04-18 DIAGNOSIS — F10939 Alcohol use, unspecified with withdrawal, unspecified: Secondary | ICD-10-CM

## 2018-04-18 DIAGNOSIS — F332 Major depressive disorder, recurrent severe without psychotic features: Secondary | ICD-10-CM | POA: Diagnosis not present

## 2018-04-18 DIAGNOSIS — Z8249 Family history of ischemic heart disease and other diseases of the circulatory system: Secondary | ICD-10-CM | POA: Diagnosis not present

## 2018-04-18 DIAGNOSIS — Z811 Family history of alcohol abuse and dependence: Secondary | ICD-10-CM

## 2018-04-18 DIAGNOSIS — R45851 Suicidal ideations: Secondary | ICD-10-CM | POA: Diagnosis present

## 2018-04-18 DIAGNOSIS — F17210 Nicotine dependence, cigarettes, uncomplicated: Secondary | ICD-10-CM | POA: Diagnosis present

## 2018-04-18 DIAGNOSIS — F4310 Post-traumatic stress disorder, unspecified: Secondary | ICD-10-CM | POA: Diagnosis present

## 2018-04-18 DIAGNOSIS — I119 Hypertensive heart disease without heart failure: Secondary | ICD-10-CM | POA: Diagnosis present

## 2018-04-18 DIAGNOSIS — Z23 Encounter for immunization: Secondary | ICD-10-CM

## 2018-04-18 DIAGNOSIS — I43 Cardiomyopathy in diseases classified elsewhere: Secondary | ICD-10-CM | POA: Diagnosis present

## 2018-04-18 DIAGNOSIS — Z841 Family history of disorders of kidney and ureter: Secondary | ICD-10-CM

## 2018-04-18 DIAGNOSIS — F419 Anxiety disorder, unspecified: Secondary | ICD-10-CM | POA: Diagnosis not present

## 2018-04-18 DIAGNOSIS — G4700 Insomnia, unspecified: Secondary | ICD-10-CM | POA: Diagnosis present

## 2018-04-18 LAB — RAPID URINE DRUG SCREEN, HOSP PERFORMED
Amphetamines: NOT DETECTED
BARBITURATES: NOT DETECTED
BENZODIAZEPINES: POSITIVE — AB
COCAINE: NOT DETECTED
Opiates: NOT DETECTED
TETRAHYDROCANNABINOL: NOT DETECTED

## 2018-04-18 MED ORDER — LORAZEPAM 1 MG PO TABS: 0.0000 mg | ORAL_TABLET | Freq: Two times a day (BID) | ORAL | Status: DC

## 2018-04-18 MED ORDER — SERTRALINE HCL 50 MG PO TABS
50.0000 mg | ORAL_TABLET | Freq: Every day | ORAL | Status: DC
  Filled 2018-04-18: qty 1

## 2018-04-18 MED ORDER — LORAZEPAM 1 MG PO TABS
2.0000 mg | ORAL_TABLET | ORAL | Status: DC | PRN
  Administered 2018-04-18: 2 mg via ORAL
  Administered 2018-04-19: 1 mg via ORAL
  Administered 2018-04-19: 2 mg via ORAL
  Administered 2018-04-20: 1 mg via ORAL
  Filled 2018-04-18 (×4): qty 2

## 2018-04-18 MED ORDER — ONDANSETRON 4 MG PO TBDP
4.0000 mg | ORAL_TABLET | Freq: Three times a day (TID) | ORAL | Status: DC | PRN
  Administered 2018-04-18 – 2018-04-24 (×7): 4 mg via ORAL
  Filled 2018-04-18 (×6): qty 1

## 2018-04-18 MED ORDER — VITAMIN B-1 100 MG PO TABS
100.0000 mg | ORAL_TABLET | Freq: Every day | ORAL | Status: DC
  Administered 2018-04-19 – 2018-04-24 (×5): 100 mg via ORAL
  Filled 2018-04-18 (×10): qty 1

## 2018-04-18 MED ORDER — NICOTINE POLACRILEX 2 MG MT GUM
2.0000 mg | CHEWING_GUM | OROMUCOSAL | Status: DC | PRN
  Administered 2018-04-18: 2 mg via ORAL

## 2018-04-18 MED ORDER — PNEUMOCOCCAL VAC POLYVALENT 25 MCG/0.5ML IJ INJ: 0.5000 mL | INJECTION | INTRAMUSCULAR | Status: DC

## 2018-04-18 MED ORDER — DICYCLOMINE HCL 20 MG PO TABS
20.0000 mg | ORAL_TABLET | Freq: Three times a day (TID) | ORAL | Status: DC
  Administered 2018-04-18 – 2018-04-24 (×23): 20 mg via ORAL
  Filled 2018-04-18 (×35): qty 1

## 2018-04-18 MED ORDER — LORAZEPAM 1 MG PO TABS: 1.0000 mg | ORAL_TABLET | ORAL | Status: DC | PRN

## 2018-04-18 MED ORDER — TRAZODONE HCL 50 MG PO TABS
50.0000 mg | ORAL_TABLET | Freq: Every evening | ORAL | Status: DC | PRN
  Filled 2018-04-18: qty 1

## 2018-04-18 MED ORDER — MAGNESIUM HYDROXIDE 400 MG/5ML PO SUSP: 30.0000 mL | Freq: Every day | ORAL | Status: DC | PRN

## 2018-04-18 MED ORDER — VITAMIN D3 25 MCG (1000 UNIT) PO TABS
2000.0000 [IU] | ORAL_TABLET | Freq: Every day | ORAL | Status: DC
  Administered 2018-04-18 – 2018-04-24 (×6): 2000 [IU] via ORAL
  Filled 2018-04-18 (×11): qty 2

## 2018-04-18 MED ORDER — NICOTINE 14 MG/24HR TD PT24
14.0000 mg | MEDICATED_PATCH | Freq: Every day | TRANSDERMAL | Status: DC
  Filled 2018-04-18 (×5): qty 1

## 2018-04-18 MED ORDER — PROSIGHT PO TABS
1.0000 | ORAL_TABLET | Freq: Every day | ORAL | Status: DC
  Administered 2018-04-18: 1 via ORAL
  Filled 2018-04-18 (×4): qty 1

## 2018-04-18 MED ORDER — BUPROPION HCL 75 MG PO TABS
75.0000 mg | ORAL_TABLET | Freq: Two times a day (BID) | ORAL | Status: DC
  Filled 2018-04-18 (×2): qty 1

## 2018-04-18 MED ORDER — ALUM & MAG HYDROXIDE-SIMETH 200-200-20 MG/5ML PO SUSP
30.0000 mL | ORAL | Status: DC | PRN
  Administered 2018-04-19 – 2018-04-23 (×6): 30 mL via ORAL
  Filled 2018-04-18 (×6): qty 30

## 2018-04-18 MED ORDER — THIAMINE HCL 100 MG/ML IJ SOLN: 100.0000 mg | Freq: Every day | INTRAMUSCULAR | Status: DC

## 2018-04-18 MED ORDER — FOLIC ACID 1 MG PO TABS
1.0000 mg | ORAL_TABLET | Freq: Every day | ORAL | Status: DC
  Administered 2018-04-18 – 2018-04-24 (×6): 1 mg via ORAL
  Filled 2018-04-18 (×8): qty 1
  Filled 2018-04-18: qty 7
  Filled 2018-04-18: qty 1
  Filled 2018-04-18: qty 7
  Filled 2018-04-18: qty 1

## 2018-04-18 MED ORDER — LORAZEPAM 1 MG PO TABS: 0.0000 mg | ORAL_TABLET | Freq: Four times a day (QID) | ORAL | Status: DC

## 2018-04-18 MED ORDER — ONDANSETRON 4 MG PO TBDP
4.0000 mg | ORAL_TABLET | Freq: Three times a day (TID) | ORAL | Status: DC | PRN
  Administered 2018-04-18: 4 mg via ORAL
  Filled 2018-04-18: qty 1

## 2018-04-18 MED ORDER — LORAZEPAM 2 MG/ML IJ SOLN: 0.0000 mg | Freq: Four times a day (QID) | INTRAMUSCULAR | Status: DC

## 2018-04-18 MED ORDER — IBUPROFEN 600 MG PO TABS
600.0000 mg | ORAL_TABLET | Freq: Four times a day (QID) | ORAL | Status: DC | PRN
  Administered 2018-04-18 – 2018-04-23 (×10): 600 mg via ORAL
  Filled 2018-04-18 (×11): qty 1

## 2018-04-18 MED ORDER — ACETAMINOPHEN 325 MG PO TABS
650.0000 mg | ORAL_TABLET | Freq: Four times a day (QID) | ORAL | Status: DC | PRN
  Administered 2018-04-18 – 2018-04-23 (×11): 650 mg via ORAL
  Filled 2018-04-18 (×10): qty 2

## 2018-04-18 MED ORDER — LORAZEPAM 2 MG/ML IJ SOLN: 0.0000 mg | Freq: Two times a day (BID) | INTRAMUSCULAR | Status: DC

## 2018-04-18 NOTE — [facility] Group Notes (Signed)
[place] Group Notes:  (Nursing/MHT/Case Management/Adjunct)  Date:  04/18/2018  Time:  5:47 PM  Type of Therapy:  Nurse Education  Participation Level:  Active  Participation Quality:  Appropriate and Attentive  Affect:  Appropriate  Cognitive:  Alert and Appropriate  Insight:  Good  Engagement in Group:  Engaged  Modes of Intervention:  Discussion and Education  Summary of Progress/Problems:Today the RN discussed healthy coping skills and positive self-image. The patient was active and engaged throughout.  Lesli Albee 04/18/2018, 5:47 PM

## 2018-04-18 NOTE — ED Notes (Signed)
Pt alert and oriented, pt denies any pain at this time. Pt denies any si,hi, and avh. Pt complain of nausea. Pt resting quietly in room, will continue to monitor.

## 2018-04-18 NOTE — Progress Notes (Signed)
Per Patriciaann Clan, PA pt meets criteria for inpt treatment. TTS to seek placement once medically cleared. EDP Dr. Billy Fischer, MD and pt's nurse Reagan, Audry Riles, RN have been advised.   Lind Covert, MSW, LCSW Therapeutic Triage Specialist  [phone]

## 2018-04-18 NOTE — BH Assessment (Signed)
04/18/2018 Per Glennon Hamilton, pt accepted to [facility] 300-2 after 1:30 pm to Dr. Mallie Darting. Notified ED staff.

## 2018-04-18 NOTE — ED Notes (Signed)
[employer] on unit to transfer pt to [facility] Adult unit per MD order. Personal property given to [employer] for transfer. Pt ambulatory off unit in police custody.  

## 2018-04-18 NOTE — [facility] Suicide Risk Assessment (Signed)
[facility] Admission Suicide Risk Assessment   Nursing information obtained from:  Patient Demographic factors:  Low socioeconomic status Current Mental Status:  NA Loss Factors:  Loss of significant relationship, Legal issues, Financial problems / change in socioeconomic status Historical Factors:  Prior suicide attempts Risk Reduction Factors:  Responsible for children under 46 years of age  Total Time spent with patient: 30 minutes Principal Problem: <principal problem not specified> Diagnosis:   Patient Active Problem List   Diagnosis Date Noted  . Major depressive disorder, single episode, severe without psychosis ([place]) [F32.2] 04/18/2018  . Low hemoglobin [D64.9] 02/15/2017  . Gastric cancer ([place]) [C16.9] 02/15/2017  . Alcohol use disorder, moderate, dependence ([place]) [F10.20] 02/15/2017  . Viral gastroenteritis [A08.4] 01/11/2015  . Needle stick injury of finger [IMO0002] 12/11/2014  . Essential hypertension [I10] 12/11/2014  . Cough [R05] 12/11/2014  . Major depressive disorder, recurrent severe without psychotic features ([place]) [F33.2] 11/13/2014  . PTSD (post-traumatic stress disorder) [F43.10] 11/13/2014  . Alcohol use disorder, severe, dependence ([place]) [F10.20] 11/12/2014  . Suicidal ideation [R45.851] 11/12/2014  . 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 Chest pain [R07.9] 11/02/2013  . Obesity [E66.9] 08/21/2011  . Hypertensive cardiomyopathy ([place]) [I11.9, I43] 08/20/2011   Subjective Data: Patient is seen and examined.  Patient is a 47 year old female with a past psychiatric history significant for alcohol dependence who presented to the [facility] emergency department on 04/17/2018 under involuntary commitment.  The patient stated that she had moved from [place] back to [place].  Her mother was in poor health and she wanted to come back and help her.  She has had a history of alcohol dependence, but had been sober since January  2019.  She took a drink of alcohol with her brother the other day, and then relapsed.  Her family found her located with 5 bottles of [place] and several [place].  The patient also stated that she had drank some amount of boxed wine.  The patient also had been recently fired from her job due to excessive drinking.  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  She yesterday was transported to the hospital because she walked out and into the middle of traffic on some [address] and fell.  She has significant bruises on her face, arms and legs.  She does not recall what happened.  CT scan of the brain was done, and was negative.  She has had multiple hospitalizations for alcohol-related issues.  Review of the electronic medical record revealed hospitalization secondary to an overdose on 04/02/2017, suicidal ideation on 03/26/2017, hospitalization in [place] secondary to alcohol on 03/18/2017, and a hospitalization at our facility on 02/14/2017.  She stated that she had been sober since January, but the relapse has apparently set her back significantly.  Her blood alcohol in the emergency room was 375.  She is requesting detox, and admits to feeling bad, isolated, pain, nausea, helplessness, hopelessness and worthlessness.  She was admitted to the hospital for evaluation and stabilization.  Continued Clinical Symptoms:  Alcohol Use Disorder Identification Test Final Score (AUDIT): 8 The "Alcohol Use Disorders Identification Test", Guidelines for Use in Primary Care, Second Edition.  World Pharmacologist [facility]). Score between 0-7:  no or low risk or alcohol related problems. Score between 8-15:  moderate risk of alcohol related problems. Score between 16-19:  high risk of alcohol related problems. Score 20 or above:  warrants further diagnostic evaluation for alcohol dependence and treatment.  CLINICAL FACTORS:   Depression:   Anhedonia Comorbid alcohol  abuse/dependence Hopelessness Impulsivity Insomnia Alcohol/Substance Abuse/Dependencies   Musculoskeletal: Strength & Muscle Tone: within normal limits Gait & Station: normal Patient leans: N/A  Psychiatric Specialty Exam: Physical Exam  Nursing note and vitals reviewed. Constitutional: She is oriented to person, place, and time. She appears well-developed and well-nourished.  HENT:  Head: Normocephalic.  Respiratory: Effort normal.  Neurological: She is alert and oriented to person, place, and time.    ROS  Blood pressure 114/75, pulse 97, temperature 98.9 F (37.2 C), temperature source Oral, resp. rate 16, height 5\' 3"  (1.6 m), weight 68.4 kg, last menstrual period 04/17/2018.Body mass index is 26.71 kg/m.  General Appearance: Disheveled  Eye Contact:  Fair  Speech:  Normal Rate  Volume:  Decreased  Mood:  Dysphoric  Affect:  Congruent  Thought Process:  Coherent and Descriptions of Associations: Circumstantial  Orientation:  Full (Time, Place, and Person)  Thought Content:  Logical  Suicidal Thoughts:  No  Homicidal Thoughts:  No  Memory:  Immediate;   Fair Recent;   Fair Remote;   Fair  Judgement:  Impaired  Insight:  Lacking  Psychomotor Activity:  Decreased  Concentration:  Concentration: Fair and Attention Span: Fair  Recall:  [employer] of Knowledge:  Fair  Language:  Fair  Akathisia:  Negative  Handed:  Right  AIMS (if indicated):     Assets:  Desire for Improvement Resilience Social Support  ADL's:  Intact  Cognition:  WNL  Sleep:         COGNITIVE FEATURES THAT CONTRIBUTE TO RISK:  None    SUICIDE RISK:   Minimal: No identifiable suicidal ideation.  Patients presenting with no risk factors but with morbid ruminations; may be classified as minimal risk based on the severity of the depressive symptoms  PLAN OF CARE: Patient is seen and examined.  Patient is a 47 year old female with a past psychiatric history significant for alcohol  dependence, substance-induced mood disorder, history of gastric bypass surgery and anemia who presented to the [facility] emergency department under involuntary commitment.  She will be admitted to the hospital.  She will be integrated into the unit.  She will be encouraged to attend groups.  She will be placed on a CIWA greater than 10 lorazepam taper.  She has been taking benzodiazepines for sleep for months.  She also has a history of seizures.  Its unclear whether the seizures were secondary to alcohol, being treated with Wellbutrin, or some other underlying issue.  Her liver function enzymes are elevated with an AST of 80 and an ALT of 41.  Her CBC is within normal limits, but she has been treated as an outpatient for significant anemia which may obscure a decreased MCV.  She also is status post gastric bypass surgery and is been on vitamins for that.  She has been previously treated with Topamax for alcohol, Wellbutrin and Zoloft for depression.  I am holding those for now.  She will meet with social work to discuss her options for any residential treatment possibilities.  I certify that inpatient services furnished can reasonably be expected to improve the patient's condition.   Sharma Covert, MD 04/18/2018, 4:01 PM

## 2018-04-18 NOTE — Tx Team (Signed)
Initial Treatment Plan 04/18/2018 4:13 PM Shannon Cervantes [id number]    PATIENT STRESSORS: Financial difficulties Legal issue Marital or family conflict Occupational concerns Substance abuse Traumatic event   PATIENT STRENGTHS: Ability for insight Communication skills Supportive family/friends   PATIENT IDENTIFIED PROBLEMS: Substance abuse  anxiety  "Feel better again."  'Right support system."               DISCHARGE CRITERIA:  Ability to meet basic life and health needs Adequate post-discharge living arrangements Improved stabilization in mood, thinking, and/or behavior Medical problems require only outpatient monitoring  PRELIMINARY DISCHARGE PLAN: Attend aftercare/continuing care group Attend PHP/IOP Attend 12-step recovery group Outpatient therapy  PATIENT/FAMILY INVOLVEMENT: This treatment plan has been presented to and reviewed with the patient, Shannon Cervantes, and/or family member.  The patient and family have been given the opportunity to ask questions and make suggestions.  Wolfgang Phoenix, RN 04/18/2018, 4:13 PM

## 2018-04-18 NOTE — Progress Notes (Signed)
Patient did not attend the evening speaker AA meeting. Pt was notified that group was beginning but remained in bed.   
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
## 2018-04-18 NOTE — ED Notes (Addendum)
Pt removed post op shoe stating "My foot is really sore."  Pt also c/o nausea.  Informed is not time for antiemetic.  Pt verbalized understanding.

## 2018-04-19 DIAGNOSIS — F419 Anxiety disorder, unspecified: Secondary | ICD-10-CM

## 2018-04-19 DIAGNOSIS — G4700 Insomnia, unspecified: Secondary | ICD-10-CM

## 2018-04-19 MED ORDER — ADULT MULTIVITAMIN W/MINERALS CH
1.0000 | ORAL_TABLET | Freq: Every day | ORAL | Status: DC
  Administered 2018-04-19 – 2018-04-24 (×5): 1 via ORAL
  Filled 2018-04-19 (×8): qty 1

## 2018-04-19 NOTE — Progress Notes (Signed)
Recreation Therapy Notes  Date: 10.28.19 Time: 0930 Location: 300 Hall Dayroom  Group Topic: Stress Management  Goal Area(s) Addresses:  Patient will verbalize importance of using healthy stress management.  Patient will identify positive emotions associated with healthy stress management.   Intervention: Stress Management  Activity :  Meditation.  LRT introduced the stress management technique of meditation.  LRT played a meditation on being resilient.  Patients were to follow along as meditation played to engage in activity.  Education:  Stress Management, Discharge Planning.   Education Outcome: Acknowledges edcuation/In group clarification offered/Needs additional education  Clinical Observations/Feedback: Pt did not attend group.    Victorino Sparrow, LRT/CTRS         Ria Comment, Marolyn Urschel A 04/19/2018 11:57 AM
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
## 2018-04-19 NOTE — Progress Notes (Signed)
D   Pt depressed ,sad and anxious   She refused her trazadone and said it makes her too dizzy    Her interactions are limited but appropriate   Her behavior is appropriate   She does report a sleeping disturbance  A    Verbal support given    Medications administered and effectiveness monitored   Discussed orthostatic blood pressure and position changes to be made slowly   Q 15 min checks R    Pt is safe at this time and verbalized understanding of teaching
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
## 2018-04-19 NOTE — Progress Notes (Signed)
Patient did attend the evening speaker AA meeting.  

## 2018-04-19 NOTE — Progress Notes (Signed)
[facility] MD Progress Note  04/19/2018 11:21 AM Shannon Cervantes  MRN:  [id number]    Evaluation:  Shannon Cervantes is awake alert and oriented x3.  Seen resting in bed.  Denies suicidal or homicidal ideations during this assessment.  Patient reports a recent relapse from alcohol states she has been sober for the past 10 months.  Reports she has plans to follow-up with AA meetings and support groups after discharge.  Currently denying depression or depressive symptoms.  Denies alcohol cravings, withdrawal symptoms or mood irritability. Patient was initiated on Ativan protocol.  Patient reports she has a court date scheduled for today and is requesting social work to follow-up with a letter.  Shannon Cervantes has also requested somebody call her employer.  Reports she has a plan to move out of her mother's house as she feels that her home environment is toxic due to her brother is also an alcoholic.  Support encouragement and reassurance was provided  History: Per admission assessment notes: Patient is a 47 year old female with a past psychiatric history significant for alcohol dependence who presented to the [facility] emergency department on 04/17/2018 under involuntary commitment. The patient stated that she had moved from [place] back to [place]. Her mother was in poor health and she wanted to come back and help her. She has had a history of alcohol dependence, but had been sober since January 2019. She took a drink of alcohol with her brother the other day, and then relapsed. Her family found her located with 5 bottles of Ciro[place]and severalFour Locos.The patient also stated that she had drank some amount of boxed wine.  Principal Problem: Alcohol dependence with alcohol-induced mood disorder ([place]) Diagnosis:   Patient Active Problem List   Diagnosis Date Noted  . Major depressive disorder, single episode, severe without psychosis ([place]) [F32.2] 04/18/2018  . Alcohol withdrawal without perceptual  disturbances with complication ([place]) [S93.734]   . Alcoholic hepatitis [K87.68]   . Low hemoglobin [D64.9] 02/15/2017  . Gastric cancer ([place]) [C16.9] 02/15/2017  . Alcohol use disorder, moderate, dependence ([place]) [F10.20] 02/15/2017  . Viral gastroenteritis [A08.4] 01/11/2015  . Needle stick injury of finger [IMO0002] 12/11/2014  . Essential hypertension [I10] 12/11/2014  . Cough [R05] 12/11/2014  . Major depressive disorder, recurrent severe without psychotic features ([place]) [F33.2] 11/13/2014  . PTSD (post-traumatic stress disorder) [F43.10] 11/13/2014  . Alcohol dependence with alcohol-induced mood disorder ([place]) [F10.24] 11/12/2014  . Suicidal ideation [R45.851] 11/12/2014  . 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 Chest pain [R07.9] 11/02/2013  . Obesity [E66.9] 08/21/2011  . Hypertensive cardiomyopathy ([place]) [I11.9, T15] 08/20/2011   Total Time spent with patient: 15 minutes  Past Psychiatric History:   Past Medical History:  Past Medical History:  Diagnosis Date  . Alcohol abuse   . Anxiety   . Depression   . Foot fracture 01-07-2011  . Hyperlipidemia   . Hypertensive cardiomyopathy ([place])     Past Surgical History:  Procedure Laterality Date  . CESAREAN SECTION    . GASTRIC BYPASS     Family History:  Family History  Problem Relation Age of Onset  . Hypertension Mother   . Heart disease Mother   . Hyperlipidemia Mother   . Depression Mother   . Colon cancer Mother   . Heart disease Father   . Depression Father   . Renal Disease Father   . Alcohol abuse Brother   . Alcohol abuse Paternal Uncle    Family Psychiatric  History:  Social History:  Social History   Substance and Sexual Activity  Alcohol Use No  . Frequency: Never   Comment: none since jan 5th     Social History   Substance and Sexual Activity  Drug Use No    Social History   Socioeconomic History  . Marital status: Divorced    Spouse name: Not on file  . Number  of children: 4  . Years of education: 52  . Highest education level: Not on file  Occupational History  . Occupation: Therapist, sports  Social Needs  . Financial resource strain: Not on file  . Food insecurity:    Worry: Not on file    Inability: Not on file  . Transportation needs:    Medical: Not on file    Non-medical: Not on file  Tobacco Use  . Smoking status: Current Every Day Smoker    Packs/day: 0.50    Years: 20.00    Pack years: 10.00    Types: Cigarettes  . Smokeless tobacco: Never Used  Substance and Sexual Activity  . Alcohol use: No    Frequency: Never    Comment: none since jan 5th  . Drug use: No  . Sexual activity: Yes    Birth control/protection: None  Lifestyle  . Physical activity:    Days per week: Not on file    Minutes per session: Not on file  . Stress: Not on file  Relationships  . Social connections:    Talks on phone: Not on file    Gets together: Not on file    Attends religious service: Not on file    Active member of club or organization: Not on file    Attends meetings of clubs or organizations: Not on file    Relationship status: Not on file  Other Topics Concern  . Not on file  Social History Narrative   Fun: Travel and shopping   Denies religious beliefs effecting healthcare.    Additional Social History:    Pain Medications: See MAR Prescriptions: See MAR Over the Counter: See MAR History of alcohol / drug use?: Yes Longest period of sobriety (when/how long): 2 yrs Negative Consequences of Use: Personal relationships Withdrawal Symptoms: Tremors Name of Substance 1: Alcohol 1 - Age of First Use: teens 1 - Amount (size/oz): excessive 1 - Frequency: daily 1 - Duration: ongoing 1 - Last Use / Amount: 04-17-18                  Sleep: Fair  Appetite:  Fair  Current Medications: Current Facility-Administered Medications  Medication Dose Route Frequency Provider Last Rate Last Dose  . acetaminophen (TYLENOL) tablet 650 mg  650  mg Oral Q6H PRN [name], NP   650 mg at 04/19/18 0837  . alum & mag hydroxide-simeth (MAALOX/MYLANTA) 200-200-20 MG/5ML suspension 30 mL  30 mL Oral Q4H PRN [name], NP   30 mL at 04/19/18 1033  . cholecalciferol (VITAMIN D) tablet 2,000 Units  2,000 Units Oral Daily Sharma Covert, MD   2,000 Units at 04/19/18 [phone]  . dicyclomine (BENTYL) tablet 20 mg  20 mg Oral TID AC & HS [name], NP   20 mg at 04/19/18 0640  . folic acid (FOLVITE) tablet 1 mg  1 mg Oral Daily Sharma Covert, MD   1 mg at 04/19/18 4403  . ibuprofen (ADVIL,MOTRIN) tablet 600 mg  600 mg Oral Q6H PRN [name], NP   600  mg at 04/18/18 2125  . LORazepam (ATIVAN) tablet 2 mg  2 mg Oral Q4H PRN Sharma Covert, MD   2 mg at 04/18/18 2124  . magnesium hydroxide (MILK OF MAGNESIA) suspension 30 mL  30 mL Oral Daily PRN [name], NP      . multivitamin with minerals tablet 1 tablet  1 tablet Oral Daily Sharma Covert, MD   1 tablet at 04/19/18 0837  . nicotine (NICODERM CQ - dosed in mg/24 hours) patch 14 mg  14 mg Transdermal Daily Sharma Covert, MD      . nicotine polacrilex (NICORETTE) gum 2 mg  2 mg Oral PRN Sharma Covert, MD   2 mg at 04/18/18 1725  . ondansetron (ZOFRAN-ODT) disintegrating tablet 4 mg  4 mg Oral Q8H PRN [name], NP   4 mg at 04/19/18 0837  . pneumococcal 23 valent vaccine (PNU-IMMUNE) injection 0.5 mL  0.5 mL Intramuscular Tomorrow-1000 Sharma Covert, MD      . thiamine (VITAMIN B-1) tablet 100 mg  100 mg Oral Daily [name], NP   100 mg at 04/19/18 0837  . traZODone (DESYREL) tablet 50 mg  50 mg Oral QHS PRN Sharma Covert, MD        Lab Results:  Results for orders placed or performed during the hospital encounter of 04/17/18 (from the past 48 hour(s))  Comprehensive metabolic panel     Status: Abnormal   Collection Time: 04/17/18  8:30 PM  Result Value Ref Range   Sodium 142 135 - 145 mmol/L   Potassium 4.1 3.5 - 5.1  mmol/L   Chloride 109 98 - 111 mmol/L   CO2 22 22 - 32 mmol/L   Glucose, Bld 94 70 - 99 mg/dL   BUN 7 6 - 20 mg/dL   Creatinine, Ser 0.53 0.44 - 1.00 mg/dL   Calcium 8.4 (L) 8.9 - 10.3 mg/dL   Total Protein 7.8 6.5 - 8.1 g/dL   Albumin 4.4 3.5 - 5.0 g/dL   AST 80 (H) 15 - 41 U/L   ALT 41 0 - 44 U/L   Alkaline Phosphatase 106 38 - 126 U/L   Total Bilirubin 0.8 0.3 - 1.2 mg/dL   GFR calc non Af Amer >60 >60 mL/min   GFR calc Af Amer >60 >60 mL/min    Comment: (NOTE) The eGFR has been calculated using the CKD EPI equation. This calculation has not been validated in all clinical situations. eGFR's persistently <60 mL/min signify possible Chronic Kidney Disease.    Anion gap 11 5 - 15    Comment: Performed at [facility], [place] [address] [place], [place] [address]  Ethanol     Status: Abnormal   Collection Time: 04/17/18  8:30 PM  Result Value Ref Range   Alcohol, Ethyl (B) 375 (HH) <10 mg/dL    Comment: CRITICAL RESULT CALLED TO, READ BACK BY AND VERIFIED WITH: E,RBAGAN AT 2155 ON 04/17/18 BY A,MOHAMED (NOTE) Lowest detectable limit for serum alcohol is 10 mg/dL. For medical purposes only. Performed at [facility], [place] [address] [place], [place] [address]   cbc     Status: Abnormal   Collection Time: 04/17/18  8:30 PM  Result Value Ref Range   WBC 10.6 (H) 4.0 - 10.5 K/uL   RBC 4.05 3.87 - 5.11 MIL/uL   Hemoglobin 13.0 12.0 - 15.0 g/dL   HCT 38.6 36.0 - 46.0 %   MCV  95.3 80.0 - 100.0 fL   MCH 32.1 26.0 - 34.0 pg   MCHC 33.7 30.0 - 36.0 g/dL   RDW 13.7 11.5 - 15.5 %   Platelets 281 150 - 400 K/uL   nRBC 0.0 0.0 - 0.2 %    Comment: Performed at [facility], [place] [address] [place], [place] [address]  Salicylate level     Status: None   Collection Time: 04/17/18  8:30 PM  Result Value Ref Range   Salicylate Lvl <4.0 2.8 - 30.0 mg/dL    Comment: Performed at [facility], [place] [address], [place], [place] [address]  Acetaminophen level     Status: Abnormal   Collection Time: 04/17/18  8:30 PM  Result Value Ref Range   Acetaminophen (Tylenol), Serum <10 (L) 10 - 30 ug/mL    Comment: (NOTE) Therapeutic concentrations vary significantly. A range of 10-30 ug/mL  may be an effective concentration for many patients. However, some  are best treated at concentrations outside of this range. Acetaminophen concentrations >150 ug/mL at 4 hours after ingestion  and >50 ug/mL at 12 hours after ingestion are often associated with  toxic reactions. Performed at [facility], [place] [address], [place], [place] [address]   I-Stat beta hCG blood, ED     Status: None   Collection Time: 04/17/18  8:38 PM  Result Value Ref Range   I-stat hCG, quantitative <5.0 <5 mIU/mL   Comment 3            Comment:   GEST. AGE      CONC.  (mIU/mL)   <=1 WEEK        5 - 50     2 WEEKS       50 - 500     3 WEEKS       100 - 10,000     4 WEEKS     1,000 - 30,000        FEMALE AND NON-PREGNANT FEMALE:     LESS THAN 5 mIU/mL   Rapid urine drug screen (hospital performed)     Status: Abnormal   Collection Time: 04/17/18 10:38 PM  Result Value Ref Range   Opiates NONE DETECTED NONE DETECTED   Cocaine NONE DETECTED NONE DETECTED   Benzodiazepines POSITIVE (A) NONE DETECTED   Amphetamines NONE DETECTED NONE DETECTED   Tetrahydrocannabinol NONE DETECTED NONE DETECTED   Barbiturates NONE DETECTED NONE DETECTED    Comment: (NOTE) DRUG SCREEN FOR MEDICAL PURPOSES ONLY.  IF CONFIRMATION IS NEEDED FOR ANY PURPOSE, NOTIFY LAB WITHIN 5 DAYS. LOWEST DETECTABLE LIMITS FOR URINE DRUG SCREEN Drug Class                     Cutoff (ng/mL) Amphetamine and metabolites    1000 Barbiturate and metabolites    200 Benzodiazepine                 956 Tricyclics and metabolites     300 Opiates and metabolites        300 Cocaine and metabolites        300 THC                            50 Performed at  [facility], [place] [address], [place], [place] [address]     Blood Alcohol level:  Lab Results  Component Value Date   [facility]  375 (HH) 04/17/2018   ETH 368 (HH) 27/51/7001    Metabolic Disorder Labs: No results found for: HGBA1C, MPG No results found for: PROLACTIN Lab Results  Component Value Date   CHOL 167 10/31/2014   TRIG 62.0 10/31/2014   HDL 86.90 10/31/2014   CHOLHDL 2 10/31/2014   VLDL 12.4 10/31/2014   LDLCALC 68 10/31/2014   LDLCALC 100 (H) 08/20/2011    Physical Findings: AIMS: Facial and Oral Movements Muscles of Facial Expression: None, normal Lips and Perioral Area: None, normal Jaw: None, normal Tongue: None, normal,Extremity Movements Upper (arms, wrists, hands, fingers): None, normal Lower (legs, knees, ankles, toes): None, normal, Trunk Movements Neck, shoulders, hips: None, normal, Overall Severity Severity of abnormal movements (highest score from questions above): None, normal Incapacitation due to abnormal movements: None, normal Patient's awareness of abnormal movements (rate only patient's report): No Awareness, Dental Status Current problems with teeth and/or dentures?: No Does patient usually wear dentures?: No  CIWA:  CIWA-Ar Total: 3 COWS:  COWS Total Score: 5  Musculoskeletal: Strength & Muscle Tone: within normal limits Gait & Station: normal Patient leans: N/A  Psychiatric Specialty Exam: Physical Exam  Constitutional: She appears well-developed.  Neurological: She is alert.  Psychiatric: She has a normal mood and affect. Her behavior is normal.    Review of Systems  Psychiatric/Behavioral: Positive for substance abuse. Negative for depression. The patient is nervous/anxious.   All other systems reviewed and are negative.   Blood pressure 123/90, pulse 88, temperature 98.7 F (37.1 C), temperature source Oral, resp. rate 16, height _0  (1.6 m), weight 68.4 kg, last menstrual period 04/17/2018.Body mass index  is 26.71 kg/m.  General Appearance: Casual  Eye Contact:  Fair  Speech:  Clear and Coherent  Volume:  Normal  Mood:  Anxious  Affect:  Congruent  Thought Process:  Coherent  Orientation:  Full (Time, Place, and Person)  Thought Content:  Logical  Suicidal Thoughts:  No  Homicidal Thoughts:  No  Memory:  Immediate;   Fair Recent;   Fair Remote;   Fair  Judgement:  Fair  Insight:  Fair  Psychomotor Activity:  Normal  Concentration:  Concentration: Fair  Recall:  [employer] of Knowledge:  Fair  Language:  Fair  Akathisia:  No  Handed:  Right  AIMS (if indicated):     Assets:  Communication Skills Desire for Improvement Physical Health Social Support Talents/Skills  ADL's:  Intact  Cognition:  WNL  Sleep:  Number of Hours: 6.75     Treatment Plan Summary: Daily contact with patient to assess and evaluate symptoms and progress in treatment and Medication management   Continue with current treatment plan on 04/19/2018 as listed below except were noted   Insomnia :  Continue with Trazodone 50 mg po nightly   Started on CWIA/ Ativan Protocol  Will continue to monitor vitals ,medication compliance and treatment side effects while patient is here.  Reviewed labs:,BAL - 375 UDS - positive for  benzodizpines.   CSW will start working on disposition.  Patient to participate in therapeutic milieu  Derrill Center, NP 04/19/2018, 11:21 AM

## 2018-04-19 NOTE — Tx Team (Signed)
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 acetaminophen (TYLENOL) tablet 650 mg  650 mg Oral Q6H PRN [name], NP   650 mg at 04/19/18 0837  . alum & mag hydroxide-simeth (MAALOX/MYLANTA) 200-200-20 MG/5ML suspension 30 mL  30 mL Oral Q4H PRN [name], NP   30 mL at 04/19/18 1033  . cholecalciferol (VITAMIN D) tablet 2,000 Units  2,000 Units Oral Daily Sharma Covert, MD   2,000 Units at 04/19/18 [phone]  . dicyclomine (BENTYL) tablet 20 mg  20 mg Oral TID AC & HS [name], NP   20 mg at 04/19/18 1211  . folic acid (FOLVITE) tablet 1 mg  1 mg Oral Daily Sharma Covert, MD   1 mg at 04/19/18 5329  . ibuprofen (ADVIL,MOTRIN) tablet 600 mg  600 mg Oral Q6H PRN [name], NP   600 mg at 04/19/18 1212  . LORazepam (ATIVAN) tablet 2 mg  2 mg Oral Q4H PRN Sharma Covert, MD   2 mg at 04/18/18 2124  . magnesium hydroxide (MILK OF MAGNESIA) suspension 30 mL  30 mL Oral Daily PRN [name], NP      . multivitamin with minerals tablet 1 tablet  1 tablet Oral Daily Sharma Covert, MD   1 tablet at 04/19/18 0837  . nicotine (NICODERM CQ - dosed in mg/24 hours) patch 14 mg  14 mg Transdermal Daily Sharma Covert, MD      . nicotine polacrilex (NICORETTE) gum 2 mg  2 mg Oral PRN Sharma Covert, MD   2 mg at 04/18/18 1725  . ondansetron (ZOFRAN-ODT) disintegrating tablet 4  mg  4 mg Oral Q8H PRN [name], NP   4 mg at 04/19/18 0837  . pneumococcal 23 valent vaccine (PNU-IMMUNE) injection 0.5 mL  0.5 mL Intramuscular Tomorrow-1000 Sharma Covert, MD      . thiamine (VITAMIN B-1) tablet 100 mg  100 mg Oral Daily [name], NP   100 mg at 04/19/18 0837  . traZODone (DESYREL) tablet 50 mg  50 mg Oral QHS PRN Sharma Covert, MD       PTA Medications: Medications Prior to Admission  Medication Sig Dispense Refill Last Dose  . acetaminophen (TYLENOL) 500 MG tablet Take 1,000 mg by mouth every 6 (six) hours as needed for moderate pain.   Past Month at Unknown time  . ferrous sulfate 325 (65 FE) MG tablet Take 1 tablet (325 mg total) by mouth daily with breakfast. 30 tablet 0 Past Month at Unknown time  . folic acid (FOLVITE) 1 MG tablet Take 1 mg by mouth daily.   Past Month at Unknown time  . hydrOXYzine (ATARAX/VISTARIL) 25 MG tablet Take 1 tablet (25 mg total) by mouth every 6 (six) hours as needed for anxiety (insomnia). 30 tablet 0 Past Month at Unknown time  . ondansetron (ZOFRAN) 4 MG tablet Take 1 tablet (4 mg total) by mouth every 6 (six) hours. 20 tablet 0 Past  Month at Unknown time  . ranitidine (ZANTAC) 150 MG tablet Take 150 mg by mouth 2 (two) times daily.    Past Month at Unknown time  . estazolam (PROSOM) 2 MG tablet Take 2 mg by mouth at bedtime.   07/11/2017 at Unknown time  . LORazepam (ATIVAN) 0.5 MG tablet Take 0.5 mg by mouth daily as needed for anxiety.   Past Month at Unknown time  . Vitamin D, Ergocalciferol, (DRISDOL) 50000 units CAPS capsule Take 50,000 Units by mouth every 7 (seven) days.   Completed Course at Unknown time    Patient Stressors: Financial difficulties Legal issue Marital or family conflict Occupational concerns Substance abuse Traumatic event  Patient Strengths: Ability for insight Communication skills Supportive family/friends  Treatment Modalities: Medication Management, Group therapy, Case  management,  1 to 1 session with clinician, Psychoeducation, Recreational therapy.   Physician Treatment Plan for Primary Diagnosis: Alcohol dependence with alcohol-induced mood disorder ([place]) Long Term Goal(s): Improvement in symptoms so as ready for discharge Improvement in symptoms so as ready for discharge   Short Term Goals: Ability to identify changes in lifestyle to reduce recurrence of condition will improve Ability to verbalize feelings will improve Ability to disclose and discuss suicidal ideas Ability to demonstrate self-control will improve Ability to identify and develop effective coping behaviors will improve Ability to maintain clinical measurements within normal limits will improve Ability to identify triggers associated with substance abuse/mental health issues will improve Ability to identify changes in lifestyle to reduce recurrence of condition will improve Ability to verbalize feelings will improve Ability to disclose and discuss suicidal ideas Ability to demonstrate self-control will improve Ability to identify and develop effective coping behaviors will improve Ability to maintain clinical measurements within normal limits will improve Ability to identify triggers associated with substance abuse/mental health issues will improve  Medication Management: Evaluate patient's response, side effects, and tolerance of medication regimen.  Therapeutic Interventions: 1 to 1 sessions, Unit Group sessions and Medication administration.  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[place])   Alcohol withdrawal without perceptual disturbances with complication ([place])   Alcoholic hepatitis  Long Term Goal(s): Improvement in symptoms so as ready for  discharge Improvement in symptoms so as ready for discharge   Short Term Goals: Ability to identify changes in lifestyle to reduce recurrence of condition will improve Ability to verbalize feelings will improve Ability to disclose and discuss suicidal ideas Ability to demonstrate self-control will improve Ability to identify and develop effective coping behaviors will improve Ability to maintain clinical measurements within normal limits will improve Ability to identify triggers associated with substance abuse/mental health issues will improve Ability to identify changes in lifestyle to reduce recurrence of condition will improve Ability to verbalize feelings will improve Ability to disclose and discuss suicidal ideas Ability to demonstrate self-control will improve Ability to identify and develop effective coping behaviors will improve Ability to maintain clinical measurements within normal limits will improve Ability to identify triggers associated with substance abuse/mental health issues will improve     Medication Management: Evaluate patient's response, side effects, and tolerance of medication regimen.  Therapeutic Interventions: 1 to 1 sessions, Unit Group sessions and Medication administration.  Evaluation of Outcomes: Progressing   RN Treatment Plan for Primary Diagnosis: Alcohol dependence with alcohol-induced mood disorder ([place]) Long Term Goal(s): Knowledge of disease and  therapeutic regimen to maintain health will improve  Short Term Goals: Ability to remain free from injury will improve, Ability to verbalize frustration and anger appropriately will improve, Ability to disclose and discuss suicidal ideas and Ability to identify and develop effective coping behaviors will improve  Medication Management: RN will administer medications as ordered by provider, will assess and evaluate patient's response and provide education to patient for prescribed medication. RN will report  any adverse and/or side effects to prescribing provider.  Therapeutic Interventions: 1 on 1 counseling sessions, Psychoeducation, Medication administration, Evaluate responses to treatment, Monitor vital signs and CBGs as ordered, Perform/monitor CIWA, COWS, AIMS and Fall Risk screenings as ordered, Perform wound care treatments as ordered.  Evaluation of Outcomes: Progressing   LCSW Treatment Plan for Primary Diagnosis: Alcohol dependence with alcohol-induced mood disorder ([place]) Long Term Goal(s): Safe transition to appropriate next level of care at discharge, Engage patient in therapeutic group addressing interpersonal concerns.  Short Term Goals: Engage patient in aftercare planning with referrals and resources, Facilitate patient progression through stages of change regarding substance use diagnoses and concerns and Identify triggers associated with mental health/substance abuse issues  Therapeutic Interventions: Assess for all discharge needs, 1 to 1 time with Social worker, Explore available resources and support systems, Assess for adequacy in community support network, Educate family and significant other(s) on suicide prevention, Complete Psychosocial Assessment, Interpersonal group therapy.  Evaluation of Outcomes: Progressing   Progress in Treatment: Attending groups: No. Participating in groups: No. Taking medication as prescribed: Yes. Toleration medication: Yes. Family/Significant other contact made: No, will contact:  family member if pt conents to collateral contact.  Patient understands diagnosis: Yes. Discussing patient identified problems/goals with staff: Yes. Medical problems stabilized or resolved: Yes. Denies suicidal/homicidal ideation: Yes. Issues/concerns per patient self-inventory: No. Other: n/a   New problem(s) identified: No, Describe:  n/a  New Short Term/Long Term Goal(s): detox, medication management for mood stabilization; elimination of SI thoughts;  development of comprehensive mental wellness/sobriety plan.   Patient Goals:  "to feel better again and build my support system."   Discharge Plan or Barriers: CSW assessing for appropriate referrals. [facility] pamphlet, Mobile Crisis information, and AA/NA information provided to patient for additional community support and resources.   Reason for Continuation of Hospitalization: Anxiety Depression Medication stabilization Suicidal ideation Withdrawal symptoms  Estimated Length of Stay: Thursday, 04/22/18  Attendees: Patient: 04/19/2018 12:50 PM  Physician: Dr. Mallie Darting MD; Dr. Nancy Fetter MD 04/19/2018 12:50 PM  Nursing: Yetta Flock RN; Estill Bamberg RN 04/19/2018 12:50 PM  RN Care Manager:x 04/19/2018 12:50 PM  Social Worker: Janice Norrie LCSW 04/19/2018 12:50 PM  Recreational Therapist: x 04/19/2018 12:50 PM  Other: Lindell Spar NP 04/19/2018 12:50 PM  Other:  04/19/2018 12:50 PM  Other: 04/19/2018 12:50 PM    Scribe for Treatment Team: Avelina Laine, LCSW 04/19/2018 12:50 PM

## 2018-04-19 NOTE — Plan of Care (Signed)
  Problem: Safety: Goal: Ability to remain free from injury will improve Outcome: Progressing   Problem: Education: Goal: Ability to state activities that reduce stress will improve Outcome: Progressing   Problem: Education: Goal: Emotional status will improve Outcome: Not Progressing Goal: Mental status will improve Outcome: Not Progressing

## 2018-04-20 MED ORDER — MIRTAZAPINE 7.5 MG PO TABS
7.5000 mg | ORAL_TABLET | Freq: Every day | ORAL | Status: DC
  Filled 2018-04-20 (×2): qty 1

## 2018-04-20 MED ORDER — LORAZEPAM 1 MG PO TABS
1.0000 mg | ORAL_TABLET | Freq: Three times a day (TID) | ORAL | Status: DC | PRN
  Administered 2018-04-20: 1 mg via ORAL
  Filled 2018-04-20: qty 1

## 2018-04-20 NOTE — Progress Notes (Signed)
D   Pt depressed ,sad and anxious   She refused her remeron and said it makes her too dizzy    Her interactions are limited but appropriate   Her behavior is appropriate   She does report a sleeping disturbance and an on going headache A    Verbal support given    Medications administered and effectiveness monitored   Discussed orthostatic blood pressure and position changes to be made slowly   Q 15 min checks R    Pt is safe at this time and verbalized understanding of teaching

## 2018-04-20 NOTE — Plan of Care (Signed)
  Problem: Education: Goal: Knowledge of disease or condition will improve Outcome: Progressing Goal: Understanding of discharge needs will improve Outcome: Progressing   Problem: Education: Goal: Emotional status will improve Outcome: Not Progressing Goal: Mental status will improve Outcome: Not Progressing

## 2018-04-20 NOTE — Progress Notes (Addendum)
[facility] MD Progress Note  04/20/2018 1:19 PM Shannon Cervantes  MRN:  [id number]   Subjective: Patient reports today that she feels that she is doing better.  She denies any suicidal or homicidal ideations and denies any hallucinations.  She reports that she did not sleep well last night but, but she did not take any medication because she does not like the way trazodone makes her feel.  She reports that she is still having some nausea and some GI upset today but feels it is due to the amount of alcohol that she drank over the weekend.  She reports that she plans to get finished with detox and then she will go to outpatient services and will follow up with AA and plans to go back to living with her mother and taking care of her mother.  She is hoping to be discharged within the next 1-2 days.  Patient has very poor recollection of the events that happened prior to coming to the hospital.  She reports that her intoxication had a significant impact on her that night.  She reports that she was told that she had fallen and that is why she had the 2 black eyes and then knot on her forehead.  Objective: Patient's chart and findings reviewed and discussed with treatment team.  Patient presents in her room lying in the bed but is awake.  Patient is pleasant, calm, and cooperative.  She has not been attending many groups, but did attend the AA group last night.  She has been taking several doses of Zofran to assist with her nausea as well.  Principal Problem: Alcohol dependence with alcohol-induced mood disorder ([place]) Diagnosis:   Patient Active Problem List   Diagnosis Date Noted  . 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 Alcohol withdrawal without perceptual disturbances with complication ([place]) [L38.101]   . Alcoholic hepatitis [B51.02]   . Low hemoglobin [D64.9] 02/15/2017  . Gastric cancer ([place]) [C16.9] 02/15/2017  . Alcohol use disorder, moderate,  dependence ([place]) [F10.20] 02/15/2017  . Viral gastroenteritis [A08.4] 01/11/2015  . Needle stick injury of finger [IMO0002] 12/11/2014  . Essential hypertension [I10] 12/11/2014  . Cough [R05] 12/11/2014  . Major depressive disorder, recurrent severe without psychotic features ([place]) [F33.2] 11/13/2014  . PTSD (post-traumatic stress disorder) [F43.10] 11/13/2014  . Alcohol dependence with alcohol-induced mood disorder ([place]) [F10.24] 11/12/2014  . Suicidal ideation [R45.851] 11/12/2014  . 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 Chest pain [R07.9] 11/02/2013  . Obesity [E66.9] 08/21/2011  . Hypertensive cardiomyopathy ([place]) [I11.9, I43] 08/20/2011   Total Time spent with patient: 30 minutes  Past Psychiatric History: See H&P  Past Medical History:  Past Medical History:  Diagnosis Date  . Alcohol abuse   . Anxiety   . Depression   . Foot fracture 01-07-2011  . Hyperlipidemia   . Hypertensive cardiomyopathy ([place])     Past Surgical History:  Procedure Laterality Date  . CESAREAN SECTION    . GASTRIC BYPASS     Family History:  Family History  Problem Relation Age of Onset  . Hypertension Mother   . Heart disease Mother   . Hyperlipidemia Mother   . Depression Mother   . Colon cancer Mother   . Heart disease Father   . Depression Father   . Renal Disease Father   . Alcohol abuse Brother   . Alcohol abuse Paternal Uncle    Family Psychiatric  History: See H&P  Social History:  Social History   Substance and Sexual Activity  Alcohol Use No  . Frequency: Never   Comment: none since jan 5th     Social History   Substance and Sexual Activity  Drug Use No    Social History   Socioeconomic History  . Marital status: Divorced    Spouse name: Not on file  . Number of children: 4  . Years of education: 12  . Highest education level: Not on file  Occupational History  . Occupation: Therapist, sports  Social Needs  . Financial resource strain: Not on file   . Food insecurity:    Worry: Not on file    Inability: Not on file  . Transportation needs:    Medical: Not on file    Non-medical: Not on file  Tobacco Use  . Smoking status: Current Every Day Smoker    Packs/day: 0.50    Years: 20.00    Pack years: 10.00    Types: Cigarettes  . Smokeless tobacco: Never Used  Substance and Sexual Activity  . Alcohol use: No    Frequency: Never    Comment: none since jan 5th  . Drug use: No  . Sexual activity: Yes    Birth control/protection: None  Lifestyle  . Physical activity:    Days per week: Not on file    Minutes per session: Not on file  . Stress: Not on file  Relationships  . Social connections:    Talks on phone: Not on file    Gets together: Not on file    Attends religious service: Not on file    Active member of club or organization: Not on file    Attends meetings of clubs or organizations: Not on file    Relationship status: Not on file  Other Topics Concern  . Not on file  Social History Narrative   Fun: Travel and shopping   Denies religious beliefs effecting healthcare.    Additional Social History:    Pain Medications: See MAR Prescriptions: See MAR Over the Counter: See MAR History of alcohol / drug use?: Yes Longest period of sobriety (when/how long): 2 yrs Negative Consequences of Use: Personal relationships Withdrawal Symptoms: Tremors Name of Substance 1: Alcohol 1 - Age of First Use: teens 1 - Amount (size/oz): excessive 1 - Frequency: daily 1 - Duration: ongoing 1 - Last Use / Amount: 04-17-18                  Sleep: Fair  Appetite:  Fair  Current Medications: Current Facility-Administered Medications  Medication Dose Route Frequency Provider Last Rate Last Dose  . acetaminophen (TYLENOL) tablet 650 mg  650 mg Oral Q6H PRN [name], NP   650 mg at 04/20/18 0804  . alum & mag hydroxide-simeth (MAALOX/MYLANTA) 200-200-20 MG/5ML suspension 30 mL  30 mL Oral Q4H PRN [name],  NP   30 mL at 04/20/18 1109  . cholecalciferol (VITAMIN D) tablet 2,000 Units  2,000 Units Oral Daily Sharma Covert, MD   2,000 Units at 04/20/18 0805  . dicyclomine (BENTYL) tablet 20 mg  20 mg Oral TID AC & HS [name], NP   20 mg at 04/20/18 1112  . folic acid (FOLVITE) tablet 1 mg  1 mg Oral Daily Sharma Covert, MD   1 mg at 04/20/18 0805  . ibuprofen (ADVIL,MOTRIN) tablet 600 mg  600 mg Oral Q6H PRN [name], NP   600  mg at 04/20/18 1112  . LORazepam (ATIVAN) tablet 2 mg  2 mg Oral Q4H PRN Sharma Covert, MD   1 mg at 04/20/18 1111  . magnesium hydroxide (MILK OF MAGNESIA) suspension 30 mL  30 mL Oral Daily PRN [name], NP      . mirtazapine (REMERON) tablet 7.5 mg  7.5 mg Oral QHS Money, Lowry Ram, FNP      . multivitamin with minerals tablet 1 tablet  1 tablet Oral Daily Sharma Covert, MD   1 tablet at 04/20/18 0805  . nicotine polacrilex (NICORETTE) gum 2 mg  2 mg Oral PRN Sharma Covert, MD   2 mg at 04/18/18 1725  . ondansetron (ZOFRAN-ODT) disintegrating tablet 4 mg  4 mg Oral Q8H PRN [name], NP   4 mg at 04/20/18 0805  . pneumococcal 23 valent vaccine (PNU-IMMUNE) injection 0.5 mL  0.5 mL Intramuscular Tomorrow-1000 Sharma Covert, MD      . thiamine (VITAMIN B-1) tablet 100 mg  100 mg Oral Daily [name], NP   100 mg at 04/20/18 9562    Lab Results: No results found for this or any previous visit (from the past 48 hour(s)).  Blood Alcohol level:  Lab Results  Component Value Date   ETH 375 (HH) 04/17/2018   ETH 368 (HH) 13/01/6577    Metabolic Disorder Labs: No results found for: HGBA1C, MPG No results found for: PROLACTIN Lab Results  Component Value Date   CHOL 167 10/31/2014   TRIG 62.0 10/31/2014   HDL 86.90 10/31/2014   CHOLHDL 2 10/31/2014   VLDL 12.4 10/31/2014   LDLCALC 68 10/31/2014   LDLCALC 100 (H) 08/20/2011    Physical Findings: AIMS: Facial and Oral Movements Muscles of Facial  Expression: None, normal Lips and Perioral Area: None, normal Jaw: None, normal Tongue: None, normal,Extremity Movements Upper (arms, wrists, hands, fingers): None, normal Lower (legs, knees, ankles, toes): None, normal, Trunk Movements Neck, shoulders, hips: None, normal, Overall Severity Severity of abnormal movements (highest score from questions above): None, normal Incapacitation due to abnormal movements: None, normal Patient's awareness of abnormal movements (rate only patient's report): No Awareness, Dental Status Current problems with teeth and/or dentures?: No Does patient usually wear dentures?: No  CIWA:  CIWA-Ar Total: 11 COWS:  COWS Total Score: 5  Musculoskeletal: Strength & Muscle Tone: within normal limits Gait & Station: normal Patient leans: N/A  Psychiatric Specialty Exam: Physical Exam  ROS  Blood pressure 109/82, pulse 97, temperature 98.1 F (36.7 C), temperature source Oral, resp. rate 16, height 5\' 3"  (1.6 m), weight 68.4 kg, last menstrual period 04/17/2018.Body mass index is 26.71 kg/m.  General Appearance: Disheveled  Eye Contact:  Good  Speech:  Clear and Coherent and Normal Rate  Volume:  Normal  Mood:  Depressed  Affect:  Flat  Thought Process:  Linear and Descriptions of Associations: Intact  Orientation:  Full (Time, Place, and Person)  Thought Content:  WDL  Suicidal Thoughts:  No  Homicidal Thoughts:  No  Memory:  Immediate;   Good Recent;   Good Remote;   Good  Judgement:  Fair  Insight:  Fair  Psychomotor Activity:  Normal  Concentration:  Concentration: Good and Attention Span: Good  Recall:  Good  Fund of Knowledge:  Good  Language:  Good  Akathisia:  No  Handed:  Right  AIMS (if indicated):     Assets:  Communication Skills Desire for Improvement Financial Resources/Insurance Housing  Physical Health Social Support Transportation  ADL's:  Intact  Cognition:  WNL  Sleep:  Number of Hours: 4.75   Problems addressed MDD  severe Alcohol withdrawal Alcohol dependence  Treatment Plan Summary: Daily contact with patient to assess and evaluate symptoms and progress in treatment, Medication management and Plan is to: Start Remeron 7.5 mg p.o. nightly for mood stability, sleep, and nausea Decrease Ativan 1 mg p.o. every 8 hours as needed for a CIWA greater than 10 Encourage group therapy participation Expect discharge in 1-2 days  Lewis Shock, FNP 04/20/2018, 1:19 PM   ..Agree with NP Progress Note

## 2018-04-21 MED ORDER — ZOLPIDEM TARTRATE 5 MG PO TABS
5.0000 mg | ORAL_TABLET | Freq: Every evening | ORAL | Status: DC | PRN
  Administered 2018-04-21: 5 mg via ORAL
  Filled 2018-04-21: qty 1

## 2018-04-21 MED ORDER — LORAZEPAM 0.5 MG PO TABS
0.5000 mg | ORAL_TABLET | Freq: Four times a day (QID) | ORAL | Status: DC | PRN
  Administered 2018-04-21 – 2018-04-22 (×4): 0.5 mg via ORAL
  Filled 2018-04-21 (×4): qty 1

## 2018-04-21 MED ORDER — ACAMPROSATE CALCIUM 333 MG PO TBEC
666.0000 mg | DELAYED_RELEASE_TABLET | Freq: Three times a day (TID) | ORAL | Status: DC
  Filled 2018-04-21: qty 2
  Filled 2018-04-21: qty 12
  Filled 2018-04-21 (×3): qty 2
  Filled 2018-04-21 (×2): qty 12
  Filled 2018-04-21 (×5): qty 2
  Filled 2018-04-21 (×2): qty 12
  Filled 2018-04-21: qty 2
  Filled 2018-04-21: qty 12
  Filled 2018-04-21 (×2): qty 2

## 2018-04-21 MED ORDER — SERTRALINE HCL 50 MG PO TABS
50.0000 mg | ORAL_TABLET | Freq: Every day | ORAL | Status: DC
  Administered 2018-04-21 – 2018-04-23 (×3): 50 mg via ORAL
  Filled 2018-04-21 (×5): qty 1

## 2018-04-21 NOTE — Therapy (Signed)
Occupational Therapy Group Note  Date:  04/21/2018 Time:  1:11 PM  Group Topic/Focus:  Stress Management  Participation Level:  Minimal  Participation Quality:  Appropriate  Affect:  Depressed and Flat  Cognitive:  Appropriate  Insight: Improving  Engagement in Group:  Limited  Modes of Intervention:  Activity, Discussion, Education and Socialization  Additional Comments:    S: "I like the idea of one minute vacations"  O: Stress management group completed to use as productive coping strategy, to help mitigate maladaptive coping to integrate in functional BADL/IADL when reintegrating into community. Education given on the definition of stress and its cognitive, behavioral, emotional, and physical effects on the body. Stress management tools worksheet completed to identify negative coping mechanisms and their short and long term effects vs positive coping mechanisms with demonstration. Coping strategies taught include: relaxation based- deep breathing, counting to 10, taking a 1 minute vacation, acceptance, stress balls, relaxation audio/video, visual/mental imagery. Positive mental attitude- gratitude, acceptance, cognitive reframing, positive self talk, anger management. Gratitude journaling handout and instruction also given.   A: Pt presents to group with flat/depressed affect, minimally engageed without cueing. Pt completed stress checklist, reporting high levels of stress. With cueing, pt committed to trying to use "one minute vacations" as a stress management technique.  P:?Pt provided with education on stress management activities to implement into daily routine. Handouts given to facilitate carryover when reintegrating into community   Zenovia Jarred, MSOT, OTR/L [place] 04/21/2018, 1:11 PM

## 2018-04-21 NOTE — Plan of Care (Signed)
Problem: Safety: Goal: Periods of time without injury will increase Outcome: Progressing   Problem: Health Behavior/Discharge Planning: Goal: Identification of resources available to assist in meeting health care needs will improve Outcome: Progressing   Problem: Medication: Goal: Compliance with prescribed medication regimen will improve Outcome: Progressing DAR Note: Pt presents with sullen affect and depressed mood. Denies SI, HI and AVH when assessed. Reports continued headache "my head still hurts since yesterday and I'm still nauseous". Pt attended and participated in scheduled group this shift. Verbal outburst X1 this afternoon related to continued demands for Ativan despite explanation that her CIWA score was less than 10 "I don't know what you fucking talking about it, I've been getting it, I want my fucking medicines, I want to talk to the damn doctor or I fucking go home". Pt was loud and disruptive in milieu at the time. Pt was selectively compliant with medications when offered "I don't want the Campral, I don't have no cravings right now". Denies side effects.  Emotional support and availability provided to pt. All medications administered per MD's orders with verbal education and effects monitored. Dr. Parke Poisson made aware, new order received for PRN Ativan 0.5 mg PO Q 6 hrs. Encouraged pt to voice concerns, attend to ADLs and comply with current treatment regimen including groups. Safety checks continues at Q 15 minutes intervals without self harm gestures to note at this time.  Pt went off unit for meals with peers, returned without issues. Tolerates all PO intake well. POC continues for safety and mood stability.

## 2018-04-21 NOTE — Progress Notes (Signed)
[facility] MD Progress Note  04/21/2018 12:35 PM Amry L Montesano  MRN:  [id number]   Subjective: patient describes some improvement compared to admission. She reports improved mood but lingering anxiety, fair sleep. Denies suicidal ideations. She states Remeron has been poorly tolerated due to dizziness and wants to change to another medication. Objective : I have discussed case with treatment team and have met with patient. 47 year old female, history of alcohol use disorder, recently relapsed after several months of sobriety. Recent facial trauma related to fall, and report is that she had walked out into traffic during period of intoxication, which she states she does not remember doing . At this time reports partially improved mood, describes lingering anxiety and also reports poor sleep, which she states has been a chronic issue for her. States Trazodone was poorly tolerated in the past, and as above, also reports dizziness on Remeron trial and wants to change to another medication. No disruptive or agitated behaviors on unit. No current significant symptoms of WDL- no distal tremors or psychomotor agitation , vitals are stable.   Principal Problem: Alcohol dependence with alcohol-induced mood disorder (HCC) Diagnosis:   Patient Active Problem List   Diagnosis Date Noted  . 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 Alcohol withdrawal without perceptual disturbances with complication (HCC) [F10.239]   . Alcoholic hepatitis [K70.10]   . Low hemoglobin [D64.9] 02/15/2017  . Gastric cancer (HCC) [C16.9] 02/15/2017  . Alcohol use disorder, moderate, dependence (HCC) [F10.20] 02/15/2017  . Viral gastroenteritis [A08.4] 01/11/2015  . Needle stick injury of finger [IMO0002] 12/11/2014  . Essential hypertension [I10] 12/11/2014  . Cough [R05] 12/11/2014  . Major depressive disorder, recurrent severe without psychotic features (HCC) [F33.2]  11/13/2014  . PTSD (post-traumatic stress disorder) [F43.10] 11/13/2014  . Alcohol dependence with alcohol-induced mood disorder (HCC) [F10.24] 11/12/2014  . Suicidal ideation [R45.851] 11/12/2014  . 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 Chest pain [R07.9] 11/02/2013  . Obesity [E66.9] 08/21/2011  . Hypertensive cardiomyopathy (HCC) [I11.9, I43] 08/20/2011   Total Time spent with patient: 20 minutes  Past Psychiatric History: See H&P  Past Medical History:  Past Medical History:  Diagnosis Date  . Alcohol abuse   . Anxiety   . Depression   . Foot fracture 01-07-2011  . Hyperlipidemia   . Hypertensive cardiomyopathy (HCC)     Past Surgical History:  Procedure Laterality Date  . CESAREAN SECTION    . GASTRIC BYPASS     Family History:  Family History  Problem Relation Age of Onset  . Hypertension Mother   . Heart disease Mother   . Hyperlipidemia Mother   . Depression Mother   . Colon cancer Mother   . Heart disease Father   . Depression Father   . Renal Disease Father   . Alcohol abuse Brother   . Alcohol abuse Paternal Uncle    Family Psychiatric  History: See H&P Social History:  Social History   Substance and Sexual Activity  Alcohol Use No  . Frequency: Never   Comment: none since jan 5th     Social History   Substance and Sexual Activity  Drug Use No    Social History   Socioeconomic History  . Marital status: Divorced    Spouse name: Not on file  . Number of children: 4  . Years of education: 16  . Highest education level: Not on file  Occupational History  . Occupation: RN    Social Needs  . Financial resource strain: Not on file  . Food insecurity:    Worry: Not on file    Inability: Not on file  . Transportation needs:    Medical: Not on file    Non-medical: Not on file  Tobacco Use  . Smoking status: Current Every Day Smoker    Packs/day: 0.50    Years: 20.00    Pack years: 10.00    Types: Cigarettes  .  Smokeless tobacco: Never Used  Substance and Sexual Activity  . Alcohol use: No    Frequency: Never    Comment: none since jan 5th  . Drug use: No  . Sexual activity: Yes    Birth control/protection: None  Lifestyle  . Physical activity:    Days per week: Not on file    Minutes per session: Not on file  . Stress: Not on file  Relationships  . Social connections:    Talks on phone: Not on file    Gets together: Not on file    Attends religious service: Not on file    Active member of club or organization: Not on file    Attends meetings of clubs or organizations: Not on file    Relationship status: Not on file  Other Topics Concern  . Not on file  Social History Narrative   Fun: Travel and shopping   Denies religious beliefs effecting healthcare.    Additional Social History:    Pain Medications: See MAR Prescriptions: See MAR Over the Counter: See MAR History of alcohol / drug use?: Yes Longest period of sobriety (when/how long): 2 yrs Negative Consequences of Use: Personal relationships Withdrawal Symptoms: Tremors Name of Substance 1: Alcohol 1 - Age of First Use: teens 1 - Amount (size/oz): excessive 1 - Frequency: daily 1 - Duration: ongoing 1 - Last Use / Amount: 04-17-18  Sleep: Fair  Appetite:  improving   Current Medications: Current Facility-Administered Medications  Medication Dose Route Frequency Provider Last Rate Last Dose  . acamprosate (CAMPRAL) tablet 666 mg  666 mg Oral TID WC ,  A, MD   666 mg at 04/21/18 1217  . acetaminophen (TYLENOL) tablet 650 mg  650 mg Oral Q6H PRN Lord, Jamison Y, NP   650 mg at 04/21/18 0752  . alum & mag hydroxide-simeth (MAALOX/MYLANTA) 200-200-20 MG/5ML suspension 30 mL  30 mL Oral Q4H PRN Lord, Jamison Y, NP   30 mL at 04/21/18 1215  . cholecalciferol (VITAMIN D) tablet 2,000 Units  2,000 Units Oral Daily Clary, Greg Lawson, MD   2,000 Units at 04/21/18 0747  . dicyclomine (BENTYL) tablet 20 mg  20 mg  Oral TID AC & HS Lord, Jamison Y, NP   20 mg at 04/21/18 1218  . folic acid (FOLVITE) tablet 1 mg  1 mg Oral Daily Clary, Greg Lawson, MD   1 mg at 04/21/18 0747  . ibuprofen (ADVIL,MOTRIN) tablet 600 mg  600 mg Oral Q6H PRN Lord, Jamison Y, NP   600 mg at 04/20/18 2306  . LORazepam (ATIVAN) tablet 1 mg  1 mg Oral Q8H PRN Money, Travis B, FNP   1 mg at 04/20/18 2306  . magnesium hydroxide (MILK OF MAGNESIA) suspension 30 mL  30 mL Oral Daily PRN Lord, Jamison Y, NP      . multivitamin with minerals tablet 1 tablet  1 tablet Oral Daily Clary, Greg Lawson, MD   1 tablet at 04/21/18 0747  . nicotine polacrilex (NICORETTE) gum 2   mg  2 mg Oral PRN Clary, Greg Lawson, MD   2 mg at 04/18/18 1725  . ondansetron (ZOFRAN-ODT) disintegrating tablet 4 mg  4 mg Oral Q8H PRN Lord, Jamison Y, NP   4 mg at 04/21/18 0752  . pneumococcal 23 valent vaccine (PNU-IMMUNE) injection 0.5 mL  0.5 mL Intramuscular Tomorrow-1000 Clary, Greg Lawson, MD      . sertraline (ZOLOFT) tablet 50 mg  50 mg Oral Daily ,  A, MD   50 mg at 04/21/18 1100  . thiamine (VITAMIN B-1) tablet 100 mg  100 mg Oral Daily Lord, Jamison Y, NP   100 mg at 04/21/18 0747  . zolpidem (AMBIEN) tablet 5 mg  5 mg Oral QHS PRN ,  A, MD        Lab Results: No results found for this or any previous visit (from the past 48 hour(s)).  Blood Alcohol level:  Lab Results  Component Value Date   ETH 375 (HH) 04/17/2018   ETH 368 (HH) 02/12/2017    Metabolic Disorder Labs: No results found for: HGBA1C, MPG No results found for: PROLACTIN Lab Results  Component Value Date   CHOL 167 10/31/2014   TRIG 62.0 10/31/2014   HDL 86.90 10/31/2014   CHOLHDL 2 10/31/2014   VLDL 12.4 10/31/2014   LDLCALC 68 10/31/2014   LDLCALC 100 (H) 08/20/2011    Physical Findings: AIMS: Facial and Oral Movements Muscles of Facial Expression: None, normal Lips and Perioral Area: None, normal Jaw: None, normal Tongue: None, normal,Extremity  Movements Upper (arms, wrists, hands, fingers): None, normal Lower (legs, knees, ankles, toes): None, normal, Trunk Movements Neck, shoulders, hips: None, normal, Overall Severity Severity of abnormal movements (highest score from questions above): None, normal Incapacitation due to abnormal movements: None, normal Patient's awareness of abnormal movements (rate only patient's report): No Awareness, Dental Status Current problems with teeth and/or dentures?: No Does patient usually wear dentures?: No  CIWA:  CIWA-Ar Total: 11 COWS:  COWS Total Score: 5  Musculoskeletal: Strength & Muscle Tone: within normal limits- no tremors, no diaphoresis, no psychomotor agitation Gait & Station: normal Patient leans: N/A  Psychiatric Specialty Exam: Physical Exam  ROS denies chest pain ,no shortness of breath, no vomiting   Blood pressure 118/76, pulse 92, temperature 98.2 F (36.8 C), temperature source Oral, resp. rate 16, height 5' 3" (1.6 m), weight 68.4 kg, last menstrual period 04/17/2018.Body mass index is 26.71 kg/m.  General Appearance: improving grooming   Eye Contact:  Good  Speech:  Normal Rate  Volume:  Normal  Mood:  partially improved mood   Affect:  Appropriate, vaguely constricted   Thought Process:  Linear and Descriptions of Associations: Intact  Orientation:  Full (Time, Place, and Person)  Thought Content:  no hallucinations, no delusions, not internally preoccupied   Suicidal Thoughts:  No- denies suicidal or self injurious ideations at this time, contracts for safety on unit, denies homicidal or violent ideations  Homicidal Thoughts:  No  Memory:  recent and remote grossly intact, but does not remember events leading to admission clearly   Judgement:  Improving   Insight:  Fair- improving   Psychomotor Activity:  Normal  Concentration:  Concentration: Good and Attention Span: Good  Recall:  Good  Fund of Knowledge:  Good  Language:  Good  Akathisia:  No  Handed:   Right  AIMS (if indicated):     Assets:  Communication Skills Desire for Improvement Financial Resources/Insurance Housing Physical Health Social Support Transportation    ADL's:  Intact  Cognition:  WNL  Sleep:  Number of Hours: 5.5   Assessment -  47 year old female, history of alcohol use disorder, recently relapsed after several months of sobriety. Recent facial trauma related to fall, and report is that she had walked out into traffic during period of intoxication, which she does not remember doing. At this time patient reports feeling better, less depressed. She does endorse persistent anxiety and insomnia. Currently her vitals are stable and is not presenting with significant alcohol WDL symptoms at this time. We discussed options- prefers to discontinue Remeron which she reports is causing dizziness. Agrees to Zoloft trial, which she has taken in the past, without side effects. She also agrees to Campral trial to help decrease alcohol cravings/assist with sobriety effort. Reports history of poor tolerance to Trazodone. Agrees to low dose Ambien PRN for insomnia as needed .   Treatment Plan Summary: Daily contact with patient to assess and evaluate symptoms and progress in treatment, Medication management and Plan is to:  Treatment Plan reviewed as below today 10/30 Encourage group and milieu participation to work on coping skills and symptom reduction Encourage efforts to work on sobriety and relapse prevention Treatment team working on disposition planning options  D/C Remeron- see above  Start Zoloft 50 mgrs QDAY for depression, anxiety Start Campral 666 mgrs TID for alcohol dependence Start Ambien 5 mgrs QHS PRN for insomnia as needed   Jenne Campus, MD 04/21/2018, 12:35 PM   Patient ID: Bertram Millard, female   DOB: 17-Feb-1971, 47 y.o.   MRN: [id number]

## 2018-04-21 NOTE — Progress Notes (Signed)
Recreation Therapy Notes  Date: 10.30.19 Time: 0930 Location: [address] Dayroom  Group Topic: Stress Management  Goal Area(s) Addresses:  Patient will verbalize importance of using healthy stress management.  Patient will identify positive emotions associated with healthy stress management.   Behavioral Response: Engaged  Intervention: Stress Management  Activity :  Guided Imagery.  LRT introduced the stress management technique of guided imagery.  LRT read a script that allowed patients to envision seeing the starry sky at night.  Patients were to follow along as the guided imagery was read to fully participate in the meditation.  Education:  Stress Management, Discharge Planning.   Education Outcome: Acknowledges edcuation/In group clarification offered/Needs additional education  Clinical Observations/Feedback: Pt attended and participated in the activity.    Shannon Cervantes, LRT/CTRS     Ria Comment, Myshawn Chiriboga A 04/21/2018 10:53 AM

## 2018-04-21 NOTE — Progress Notes (Signed)
Patient did attend the evening speaker NA meeting.  

## 2018-04-22 MED ORDER — LOPERAMIDE HCL 2 MG PO CAPS
2.0000 mg | ORAL_CAPSULE | ORAL | Status: DC | PRN
  Administered 2018-04-22: 2 mg via ORAL
  Filled 2018-04-22: qty 1

## 2018-04-22 MED ORDER — TEMAZEPAM 15 MG PO CAPS
15.0000 mg | ORAL_CAPSULE | Freq: Once | ORAL | Status: DC
  Filled 2018-04-22: qty 1

## 2018-04-22 NOTE — Progress Notes (Signed)
DAR Note: Pt A & O X4. Presents very anxious and labile when engaged. Needy, demanding and irritable on interactions. Reports poor sleep last night "I need to go to the emergency room, I'm just getting worse here, I'm not sleeping, I have diarrhea, that Ambien got me like this, I need to leave". Pt has been selectively compliant with medications when offered. "I will only take the Bentyl and the Ativan". Observed pacing in halls at frequent intervals. Started on Imodium for c/o of diarrhea.  Emotional support offered to pt. Safety checks maintained. Encouraged pt to increase fluid intake and notified staff of loose stools as it occurs for further observation.  All medications offered and administered per MD's orders and effects monitored.  Pt remains safe on and off unit. Will continue to monitor for safety and changes in present condition.

## 2018-04-22 NOTE — [facility] Suicide Risk Assessment (Signed)
[facility] INPATIENT:  Family/Significant Other Suicide Prevention Education  Suicide Prevention Education:  Education Completed; Shannon Cervantes (pt's daughter) [phone] has been identified by the patient as the family member/significant other with whom the patient will be residing, and identified as the person(s) who will aid the patient in the event of a mental health crisis (suicidal ideations/suicide attempt).  With written consent from the patient, the family member/significant other has been provided the following suicide prevention education, prior to the and/or following the discharge of the patient.  The suicide prevention education provided includes the following:  Suicide risk factors  Suicide prevention and interventions  National Suicide Hotline telephone number  [facility] assessment telephone number  [facility] Emergency Assistance [place] and/or Residential Mobile Crisis Unit telephone number  Request made of family/significant other to:  Remove weapons (e.g., guns, rifles, knives), all items previously/currently identified as safety concern.    Remove drugs/medications (over-the-counter, prescriptions, illicit drugs), all items previously/currently identified as a safety concern.  The family member/significant other verbalizes understanding of the suicide prevention education information provided.  The family member/significant other agrees to remove the items of safety concern listed above.  Avelina Laine LCSW 04/22/2018, 11:35 AM

## 2018-04-22 NOTE — Progress Notes (Addendum)
[facility] MD Progress Note  04/22/2018 12:37 PM Shannon Cervantes  MRN:  [id number]   Subjective: [name] reports, "I recently relapsed on alcohol because of familial related stressors. I was IVC'ed by my daughter as a result. I needed alcohol detox & treatment for depression. I started on Sertraline, doing well on it. But, I cannot sleep at night & I did not sleep last night. I'm not doing well today. I'm ready to go home to check on my mother & my child but I have got to get some sleep. The only medicine that has ever worked for me is estazolam. I have been on this medicine for a while. Today, I'm feeling swimmy headed. I don't do well on psychotropic medications. Only Benzodiazepines has ever worked for me, but no will give them to me. I have tried everything".  47 year old female, history of alcohol use disorder, recently relapsed after several months of sobriety. Recent facial trauma related to fall, and report is that she had walked out into traffic during period of intoxication, which she states she does not remember doing. At this time reports partially improved mood, describes lingering anxiety & ongoing insomnia, which she states has been a chronic issue for her. States Trazodone was poorly tolerated in the past, and as above, also reports dizziness on Remeron trial and wants to change to another medication. No disruptive or agitated behaviors on unit. No current significant symptoms of WDL- no distal tremors or psychomotor agitation, vitals are stable. Patient seem to have a medication seeking behaviors at this time. Will try her on temazepam 15 mg once tonight. Possible discharge in am.  Principal Problem: Alcohol dependence with alcohol-induced mood disorder ([place])  Diagnosis:   Patient Active Problem List   Diagnosis Date Noted  . Major depressive disorder, single episode, severe without psychosis ([place]) [F32.2] 04/18/2018  . Alcohol withdrawal without perceptual disturbances with complication ([place])  [D32.202]   . Alcoholic hepatitis [R42.70]   . Low hemoglobin [D64.9] 02/15/2017  . Gastric cancer ([place]) [C16.9] 02/15/2017  . Alcohol use disorder, moderate, dependence ([place]) [F10.20] 02/15/2017  . Viral gastroenteritis [A08.4] 01/11/2015  . Needle stick injury of finger [IMO0002] 12/11/2014  . Essential hypertension [I10] 12/11/2014  . Cough [R05] 12/11/2014  . Major depressive disorder, recurrent severe without psychotic features ([place]) [F33.2] 11/13/2014  . PTSD (post-traumatic stress disorder) [F43.10] 11/13/2014  . Alcohol dependence with alcohol-induced mood disorder ([place]) [F10.24] 11/12/2014  . Suicidal ideation [R45.851] 11/12/2014  . 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 Chest pain [R07.9] 11/02/2013  . Obesity [E66.9] 08/21/2011  . Hypertensive cardiomyopathy ([place]) [I11.9, I43] 08/20/2011   Total Time spent with patient: 15 minutes  Past Psychiatric History: See H&P  Past Medical History:  Past Medical History:  Diagnosis Date  . Alcohol abuse   . Anxiety   . Depression   . Foot fracture 01-07-2011  . Hyperlipidemia   . Hypertensive cardiomyopathy ([place])     Past Surgical History:  Procedure Laterality Date  . CESAREAN SECTION    . GASTRIC BYPASS     Family History:  Family History  Problem Relation Age of Onset  . Hypertension Mother   . Heart disease Mother   . Hyperlipidemia Mother   . Depression Mother   . Colon cancer Mother   . Heart disease Father   . Depression Father   . Renal Disease Father   . Alcohol abuse Brother   . Alcohol abuse Paternal Uncle  Family Psychiatric  History: See H&P  Social History:  Social History   Substance and Sexual Activity  Alcohol Use No  . Frequency: Never   Comment: none since jan 5th     Social History   Substance and Sexual Activity  Drug Use No    Social History   Socioeconomic History  . Marital status: Divorced    Spouse name: Not on file  . Number of children: 4  .  Years of education: 69  . Highest education level: Not on file  Occupational History  . Occupation: Therapist, sports  Social Needs  . Financial resource strain: Not on file  . Food insecurity:    Worry: Not on file    Inability: Not on file  . Transportation needs:    Medical: Not on file    Non-medical: Not on file  Tobacco Use  . Smoking status: Current Every Day Smoker    Packs/day: 0.50    Years: 20.00    Pack years: 10.00    Types: Cigarettes  . Smokeless tobacco: Never Used  Substance and Sexual Activity  . Alcohol use: No    Frequency: Never    Comment: none since jan 5th  . Drug use: No  . Sexual activity: Yes    Birth control/protection: None  Lifestyle  . Physical activity:    Days per week: Not on file    Minutes per session: Not on file  . Stress: Not on file  Relationships  . Social connections:    Talks on phone: Not on file    Gets together: Not on file    Attends religious service: Not on file    Active member of club or organization: Not on file    Attends meetings of clubs or organizations: Not on file    Relationship status: Not on file  Other Topics Concern  . Not on file  Social History Narrative   Fun: Travel and shopping   Denies religious beliefs effecting healthcare.    Additional Social History:    Pain Medications: See MAR Prescriptions: See MAR Over the Counter: See MAR History of alcohol / drug use?: Yes Longest period of sobriety (when/how long): 2 yrs Negative Consequences of Use: Personal relationships Withdrawal Symptoms: Tremors Name of Substance 1: Alcohol 1 - Age of First Use: teens 1 - Amount (size/oz): excessive 1 - Frequency: daily 1 - Duration: ongoing 1 - Last Use / Amount: 04-17-18  Sleep: Poor  Appetite:  Good  Current Medications: Current Facility-Administered Medications  Medication Dose Route Frequency Provider Last Rate Last Dose  . acamprosate (CAMPRAL) tablet 666 mg  666 mg Oral TID WC Mischa Pollard, [name], MD      .  acetaminophen (TYLENOL) tablet 650 mg  650 mg Oral Q6H PRN [name], NP   650 mg at 04/22/18 0509  . alum & mag hydroxide-simeth (MAALOX/MYLANTA) 200-200-20 MG/5ML suspension 30 mL  30 mL Oral Q4H PRN [name], NP   30 mL at 04/22/18 1001  . cholecalciferol (VITAMIN D) tablet 2,000 Units  2,000 Units Oral Daily Sharma Covert, MD   2,000 Units at 04/21/18 0747  . dicyclomine (BENTYL) tablet 20 mg  20 mg Oral TID AC & HS [name], NP   20 mg at 04/22/18 0748  . folic acid (FOLVITE) tablet 1 mg  1 mg Oral Daily Sharma Covert, MD   1 mg at 04/21/18 0747  . ibuprofen (ADVIL,MOTRIN) tablet 600 mg  600 mg Oral Q6H PRN [name], NP   600 mg at 04/22/18 0748  . LORazepam (ATIVAN) tablet 0.5 mg  0.5 mg Oral Q6H PRN Judas Mohammad, [name], MD   0.5 mg at 04/22/18 1126  . LORazepam (ATIVAN) tablet 1 mg  1 mg Oral Q8H PRN Money, Lowry Ram, FNP   1 mg at 04/20/18 2306  . magnesium hydroxide (MILK OF MAGNESIA) suspension 30 mL  30 mL Oral Daily PRN [name], NP      . multivitamin with minerals tablet 1 tablet  1 tablet Oral Daily Sharma Covert, MD   1 tablet at 04/21/18 0747  . nicotine polacrilex (NICORETTE) gum 2 mg  2 mg Oral PRN Sharma Covert, MD   2 mg at 04/18/18 1725  . ondansetron (ZOFRAN-ODT) disintegrating tablet 4 mg  4 mg Oral Q8H PRN [name], NP   4 mg at 04/22/18 0748  . pneumococcal 23 valent vaccine (PNU-IMMUNE) injection 0.5 mL  0.5 mL Intramuscular Tomorrow-1000 Sharma Covert, MD      . sertraline (ZOLOFT) tablet 50 mg  50 mg Oral Daily Shailah Gibbins, [name], MD   50 mg at 04/22/18 0748  . temazepam (RESTORIL) capsule 15 mg  15 mg Oral Once [name], NP      . thiamine (VITAMIN B-1) tablet 100 mg  100 mg Oral Daily [name], NP   100 mg at 04/21/18 2330   Lab Results: No results found for this or any previous visit (from the past 48 hour(s)).  Blood Alcohol level:  Lab Results  Component Value Date   ETH 375 (HH)  04/17/2018   ETH 368 (HH) 07/62/2633   Metabolic Disorder Labs: No results found for: HGBA1C, MPG No results found for: PROLACTIN Lab Results  Component Value Date   CHOL 167 10/31/2014   TRIG 62.0 10/31/2014   HDL 86.90 10/31/2014   CHOLHDL 2 10/31/2014   VLDL 12.4 10/31/2014   LDLCALC 68 10/31/2014   LDLCALC 100 (H) 08/20/2011   Physical Findings: AIMS: Facial and Oral Movements Muscles of Facial Expression: None, normal Lips and Perioral Area: None, normal Jaw: None, normal Tongue: None, normal,Extremity Movements Upper (arms, wrists, hands, fingers): None, normal Lower (legs, knees, ankles, toes): None, normal, Trunk Movements Neck, shoulders, hips: None, normal, Overall Severity Severity of abnormal movements (highest score from questions above): None, normal Incapacitation due to abnormal movements: None, normal Patient's awareness of abnormal movements (rate only patient's report): No Awareness, Dental Status Current problems with teeth and/or dentures?: No Does patient usually wear dentures?: No  CIWA:  CIWA-Ar Total: 13 COWS:  COWS Total Score: 5  Musculoskeletal: Strength & Muscle Tone: within normal limits- no tremors, no diaphoresis, no psychomotor agitation Gait & Station: normal Patient leans: N/A  Psychiatric Specialty Exam: Physical Exam  Nursing note and vitals reviewed.   Review of Systems  Cardiovascular: Negative.  Negative for chest pain and palpitations.  Psychiatric/Behavioral: Positive for depression ("Improving") and substance abuse (Hx. alcoholism). Negative for hallucinations, memory loss and suicidal ideas. The patient is nervous/anxious and has insomnia.    denies chest pain ,no shortness of breath, no vomiting   Blood pressure 106/78, pulse 87, temperature 98.2 F (36.8 C), resp. rate 18, height 5\' 3"  (1.6 m), weight 68.4 kg, last menstrual period 04/17/2018.Body mass index is 26.71 kg/m.  General Appearance: improving grooming   Eye  Contact:  Good  Speech:  Normal Rate  Volume:  Normal  Mood:  partially improved mood   Affect:  Appropriate, vaguely constricted   Thought Process:  Linear and Descriptions of Associations: Intact  Orientation:  Full (Time, Place, and Person)  Thought Content:  no hallucinations, no delusions, not internally preoccupied   Suicidal Thoughts:  No- denies suicidal or self injurious ideations at this time, contracts for safety on unit, denies homicidal or violent ideations  Homicidal Thoughts:  No  Memory:  recent and remote grossly intact, but does not remember events leading to admission clearly   Judgement:  Improving   Insight:  Fair- improving   Psychomotor Activity:  Normal  Concentration:  Concentration: Good and Attention Span: Good  Recall:  Good  Fund of Knowledge:  Good  Language:  Good  Akathisia:  No  Handed:  Right  AIMS (if indicated):     Assets:  Communication Skills Desire for Improvement Financial Resources/Insurance Housing Physical Health Social Support Transportation  ADL's:  Intact  Cognition:  WNL  Sleep:  Number of Hours: 4   Assessment -  47 year old female, history of alcohol use disorder, recently relapsed after several months of sobriety. Recent facial trauma related to fall, and report is that she had walked out into traffic during period of intoxication, which she does not remember doing. At this time patient reports feeling better, less depressed. She does endorse persistent anxiety and insomnia. Currently her vitals are stable and is not presenting with significant alcohol WDL symptoms at this time. We discussed options- prefers to discontinue Remeron which she reports is causing dizziness. Agrees to Zoloft trial, which she has taken in the past, without side effects. She also agrees to Campral trial to help decrease alcohol cravings/assist with sobriety effort. Reports history of poor tolerance to Trazodone & low dose Ambien PRN for insomnia as needed.  Requested discontinuation of Ambien today. Agreed on the trial of Temazepam 15 mg once to night.  Treatment Plan Summary: Daily contact with patient to assess and evaluate symptoms and progress in treatment, Medication management and Plan is to:   - Continue inpatient hospitalization.  - Will continue today 04/22/2018 plan as below except where it is noted.  - Encourage group and milieu participation to work on Radiographer, therapeutic and symptom reduction.  - Encourage efforts to work on sobriety and relapse prevention  - Treatment team working on disposition planning options   - Initiated Temazepam 15 mg po Hs once.  - Continue Zoloft 50 mgrs QDAY for depression, anxiety.  - Continue Campral 666 mgrs TID for alcohol dependence.  - Discontinue Ambien 5 mgrs QHS PRN for insomnia as needed   Lindell Spar, NP, pmhnp, fnp-bc. 04/22/2018, 12:37 PM   Patient ID: Bertram Millard, female   DOB: 17-Aug-1970, 47 y.o.   MRN: [id number] Patient ID: Shannon Cervantes, female   DOB: 04-28-1971, 47 y.o.   MRN: [id number] .Marland KitchenAgree with NP Progress Note

## 2018-04-22 NOTE — Progress Notes (Signed)
Adult Psychoeducational Group Note  Date:  04/22/2018 Time:  9:13 PM  Group Topic/Focus:  Wrap-Up Group:   The focus of this group is to help patients review their daily goal of treatment and discuss progress on daily workbooks.  Participation Level:  Minimal  Participation Quality:  Appropriate  Affect:  Depressed and Flat  Cognitive:  Alert and Appropriate  Insight: Appropriate  Engagement in Group:  Developing/Improving  Modes of Intervention:  Clarification, Exploration and Support  Additional Comments:  Pt verbalized that this was one of the worst day for her. Patient rated her day a negative one. Pt verbalized something positive is that people here are giving her strength.  Mai Longnecker, Patrick North 04/22/2018, 9:13 PM

## 2018-04-22 NOTE — Progress Notes (Signed)
D: Pt was in dayroom playing cards with peers upon initial approach.  Pt presents with anxious, depressed affect and mood.  She has multiple complaints upon initial assessment.  Pt states her day has been "awful.  I've just been going through some really bad- I took Ambien last night and I've been going through it today."  She reports she had diarrhea earlier today and no sleep last night.  Her goal is to "try to get some sleep tonight."  Pt denies SI/HI, denies hallucinations, reports pain from headache of 5/10.  Pt has been visible in milieu interacting with peers and staff appropriately.  Pt attended evening group.    A: Introduced self to pt.  Met with pt 1:1.  Actively listened to pt and offered support and encouragement. Medications offered per order.  PRN medication administered for pain and anxiety.  Fall prevention techniques reviewed with pt and she verbalized understanding.  Q15 minute safety checks maintained.  R: Pt is safe on the unit.  Pt is compliant with medications except for Restoril, which she refused.  She states "I'm just hesitant to take anything new."  Pt verbally contracts for safety.  Will continue to monitor and assess.

## 2018-04-22 NOTE — Progress Notes (Signed)
Adult Psychoeducational Group Note  Date:  04/22/2018 Time:  6:59 PM  Group Topic/Focus:  Goals Group:   The focus of this group is to help patients establish daily goals to achieve during treatment and discuss how the patient can incorporate goal setting into their daily lives to aide in recovery.  Participation Level:  Active  Participation Quality:  Appropriate  Affect:  Appropriate  Cognitive:  Alert  Insight: Appropriate  Engagement in Group:  Engaged  Modes of Intervention:  Discussion  Additional Comments:  Pt attended group and participated in group discussion. (combined afternoon MHT group in 8:30 am group)  Shannon Cervantes 04/22/2018, 6:59 PM

## 2018-04-23 MED ORDER — LORAZEPAM 0.5 MG PO TABS
0.5000 mg | ORAL_TABLET | Freq: Three times a day (TID) | ORAL | Status: DC | PRN
  Administered 2018-04-23: 0.5 mg via ORAL
  Filled 2018-04-23: qty 1

## 2018-04-23 MED ORDER — INFLUENZA VAC SPLIT QUAD 0.5 ML IM SUSY
0.5000 mL | PREFILLED_SYRINGE | INTRAMUSCULAR | Status: AC
  Administered 2018-04-24: 0.5 mL via INTRAMUSCULAR
  Filled 2018-04-23: qty 0.5

## 2018-04-23 MED ORDER — TEMAZEPAM 7.5 MG PO CAPS
7.5000 mg | ORAL_CAPSULE | Freq: Every evening | ORAL | Status: DC | PRN
  Administered 2018-04-23: 7.5 mg via ORAL
  Filled 2018-04-23: qty 1

## 2018-04-23 NOTE — Progress Notes (Signed)
Recreation Therapy Notes  Date: 11.1.19 Time: 0930 Location: [address] Dayroom  Group Topic: Stress Management  Goal Area(s) Addresses:  Patient will verbalize importance of using healthy stress management.  Patient will identify positive emotions associated with healthy stress management.   Intervention: Stress Management  Activity :  Guided Imagery.  LRT introduced the stress management technique of guided imagery.  LRT read a script allowing patients to visualize being in a peaceful meadow.  Patients were to listen as script was read to engage in the meditation.  Education:  Stress Management, Discharge Planning.   Education Outcome: Acknowledges edcuation/In group clarification offered/Needs additional education  Clinical Observations/Feedback: Pt did not attend group.      Victorino Sparrow, LRT/CTRS         Victorino Sparrow A 04/23/2018 11:38 AM

## 2018-04-23 NOTE — Tx Team (Signed)
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 acamprosate (CAMPRAL) tablet 666 mg  666 mg Oral TID WC [name], [name], MD      . acetaminophen (TYLENOL) tablet 650 mg  650 mg Oral Q6H PRN [name], NP   650 mg at 04/23/18 2094  . alum & mag hydroxide-simeth (MAALOX/MYLANTA) 200-200-20 MG/5ML suspension 30 mL  30 mL Oral Q4H PRN [name], NP   30 mL at 04/22/18 1001  . cholecalciferol (VITAMIN D) tablet 2,000 Units  2,000 Units Oral Daily Sharma Covert, MD   2,000 Units at 04/23/18 [phone]  . dicyclomine (BENTYL) tablet 20 mg  20 mg Oral TID AC & HS [name], NP   20 mg at 04/23/18 2836  . folic acid (FOLVITE) tablet 1 mg  1 mg Oral Daily Sharma Covert, MD   1 mg at 04/23/18 0748  . ibuprofen (ADVIL,MOTRIN) tablet 600 mg  600 mg Oral Q6H PRN [name], NP   600 mg at 04/22/18 2242  . loperamide (IMODIUM) capsule 2 mg  2 mg Oral PRN [name], NP   2 mg at 04/22/18 1627  . LORazepam (ATIVAN) tablet 0.5 mg  0.5 mg Oral Q8H PRN [name], Fernando A, MD      . magnesium hydroxide (MILK OF MAGNESIA) suspension 30 mL  30 mL Oral Daily PRN [name], NP      . multivitamin with minerals tablet 1 tablet  1 tablet Oral Daily Sharma Covert, MD   1 tablet at 04/23/18 0748  . nicotine polacrilex (NICORETTE) gum 2 mg  2 mg Oral PRN Sharma Covert, MD   2 mg at 04/18/18 1725  .  ondansetron (ZOFRAN-ODT) disintegrating tablet 4 mg  4 mg Oral Q8H PRN [name], NP   4 mg at 04/22/18 0748  . pneumococcal 23 valent vaccine (PNU-IMMUNE) injection 0.5 mL  0.5 mL Intramuscular Tomorrow-1000 Sharma Covert, MD      . sertraline (ZOLOFT) tablet 50 mg  50 mg Oral Daily [name], [name], MD   50 mg at 04/23/18 0749  . temazepam (RESTORIL) capsule 7.5 mg  7.5 mg Oral QHS PRN [name], [name], MD      . thiamine (VITAMIN B-1) tablet 100 mg  100 mg Oral Daily [name], NP   100 mg at 04/23/18 6294   PTA Medications: Medications Prior to Admission  Medication Sig Dispense Refill Last Dose  . acetaminophen (TYLENOL) 500 MG tablet Take 1,000 mg by mouth every 6 (six) hours as needed for moderate pain.   Past Month at Unknown time  . ferrous sulfate 325 (65 FE) MG tablet Take 1 tablet (325 mg total) by mouth daily with breakfast. 30 tablet 0 Past Month at Unknown time  . folic acid (FOLVITE) 1 MG tablet Take 1 mg by mouth daily.   Past Month at Unknown time  . hydrOXYzine (ATARAX/VISTARIL) 25 MG tablet Take 1 tablet (25 mg total) by mouth every 6 (  six) hours as needed for anxiety (insomnia). 30 tablet 0 Past Month at Unknown time  . ondansetron (ZOFRAN) 4 MG tablet Take 1 tablet (4 mg total) by mouth every 6 (six) hours. 20 tablet 0 Past Month at Unknown time  . ranitidine (ZANTAC) 150 MG tablet Take 150 mg by mouth 2 (two) times daily.    Past Month at Unknown time  . estazolam (PROSOM) 2 MG tablet Take 2 mg by mouth at bedtime.   07/11/2017 at Unknown time  . LORazepam (ATIVAN) 0.5 MG tablet Take 0.5 mg by mouth daily as needed for anxiety.   Past Month at Unknown time  . Vitamin D, Ergocalciferol, (DRISDOL) 50000 units CAPS capsule Take 50,000 Units by mouth every 7 (seven) days.   Completed Course at Unknown time    Patient Stressors: Financial difficulties Legal issue Marital or family conflict Occupational concerns Substance abuse Traumatic event  Patient  Strengths: Ability for insight Communication skills Supportive family/friends  Treatment Modalities: Medication Management, Group therapy, Case management,  1 to 1 session with clinician, Psychoeducation, Recreational therapy.   Physician Treatment Plan for Primary Diagnosis: Alcohol dependence with alcohol-induced mood disorder ([place]) Long Term Goal(s): Improvement in symptoms so as ready for discharge Improvement in symptoms so as ready for discharge   Short Term Goals: Ability to identify changes in lifestyle to reduce recurrence of condition will improve Ability to verbalize feelings will improve Ability to disclose and discuss suicidal ideas Ability to demonstrate self-control will improve Ability to identify and develop effective coping behaviors will improve Ability to maintain clinical measurements within normal limits will improve Ability to identify triggers associated with substance abuse/mental health issues will improve Ability to identify changes in lifestyle to reduce recurrence of condition will improve Ability to verbalize feelings will improve Ability to disclose and discuss suicidal ideas Ability to demonstrate self-control will improve Ability to identify and develop effective coping behaviors will improve Ability to maintain clinical measurements within normal limits will improve Ability to identify triggers associated with substance abuse/mental health issues will improve  Medication Management: Evaluate patient's response, side effects, and tolerance of medication regimen.  Therapeutic Interventions: 1 to 1 sessions, Unit Group sessions and Medication administration.  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[place])   Alcohol withdrawal  without perceptual disturbances with complication ([place])   Alcoholic hepatitis  Long Term Goal(s): Improvement in symptoms so as ready for discharge Improvement in symptoms so as ready for discharge   Short Term Goals: Ability to identify changes in lifestyle to reduce recurrence of condition will improve Ability to verbalize feelings will improve Ability to disclose and discuss suicidal ideas Ability to demonstrate self-control will improve Ability to identify and develop effective coping behaviors will improve Ability to maintain clinical measurements within normal limits will improve Ability to identify triggers associated with substance abuse/mental health issues will improve Ability to identify changes in lifestyle to reduce recurrence of condition will improve Ability to verbalize feelings will improve Ability to disclose and discuss suicidal ideas Ability to demonstrate self-control will improve Ability to identify and develop effective coping behaviors will improve Ability to maintain clinical measurements within normal limits will improve Ability to identify triggers associated with substance abuse/mental health issues will improve     Medication Management: Evaluate patient's response, side effects, and tolerance of medication regimen.  Therapeutic Interventions: 1 to 1 sessions, Unit Group sessions and Medication administration.  Evaluation of Outcomes: Adequate for Discharge   RN Treatment Plan for Primary Diagnosis: Alcohol dependence with alcohol-induced mood disorder ([place]) Long Term Goal(s): Knowledge of disease and therapeutic regimen to maintain health will improve  Short Term Goals: Ability to remain free from injury will improve, Ability to verbalize frustration and anger appropriately will improve, Ability to disclose and discuss suicidal ideas and Ability to identify and develop effective coping behaviors will improve  Medication Management: RN will administer  medications as ordered by provider, will assess and evaluate patient's response and provide education to patient for prescribed medication. RN will report any adverse and/or side effects to prescribing provider.  Therapeutic Interventions: 1 on 1 counseling sessions, Psychoeducation, Medication administration, Evaluate responses to treatment, Monitor vital signs and CBGs as ordered, Perform/monitor CIWA, COWS, AIMS and Fall Risk screenings as ordered, Perform wound care treatments as ordered.  Evaluation of Outcomes: Adequate for Discharge   LCSW Treatment Plan for Primary Diagnosis: Alcohol dependence with alcohol-induced mood disorder ([place]) Long Term Goal(s): Safe transition to appropriate next level of care at discharge, Engage patient in therapeutic group addressing interpersonal concerns.  Short Term Goals: Engage patient in aftercare planning with referrals and resources, Facilitate patient progression through stages of change regarding substance use diagnoses and concerns and Identify triggers associated with mental health/substance abuse issues  Therapeutic Interventions: Assess for all discharge needs, 1 to 1 time with Social worker, Explore available resources and support systems, Assess for adequacy in community support network, Educate family and significant other(s) on suicide prevention, Complete Psychosocial Assessment, Interpersonal group therapy.  Evaluation of Outcomes: Adequate for Discharge   Progress in Treatment: Attending groups: No. Participating in groups: No. Taking medication as prescribed: Yes. Toleration medication: Yes. Family/Significant other contact made: Yes, individual(s) contacted:  with the patient's daughter Patient understands diagnosis: Yes. Discussing patient identified problems/goals with staff: Yes. Medical problems stabilized or resolved: Yes. Denies suicidal/homicidal ideation: Yes. Issues/concerns per patient self-inventory: No. Other: n/a    New problem(s) identified: No, Describe:  n/a  New Short Term/Long Term Goal(s): detox, medication management for mood stabilization; elimination of SI thoughts; development of comprehensive mental wellness/sobriety plan.   Patient Goals:  "to feel better again and build my support system."   Discharge Plan or Barriers: CSW assessing for appropriate referrals. [place] pamphlet, Mobile Crisis information, and AA/NA information provided to patient for additional community support and resources.   Reason for Continuation of Hospitalization: Anxiety Depression Medication stabilization Suicidal ideation Withdrawal symptoms  Estimated Length of Stay: Saturday, 04/24/18  Attendees: Patient: 04/23/2018 10:57 AM  Physician: Dr. Mallie Darting MD; Dr. Nancy Fetter MD; Dr. Neita Garnet, MD 04/23/2018 10:57 AM  Nursing: Yetta Flock RN; Estill Bamberg RN; Chong Sicilian.D, RN 04/23/2018 10:57 AM  RN Care Manager:x 04/23/2018 10:57 AM  Social Worker: Janice Norrie LCSW; Radonna Ricker, [place] 04/23/2018 10:57 AM  Recreational Therapist: x 04/23/2018 10:57 AM  Other: Lindell Spar NP 04/23/2018 10:57 AM  Other:  04/23/2018 10:57 AM  Other: 04/23/2018 10:57 AM    Scribe for Treatment Team: Marylee Floras, [place] 04/23/2018 10:57 AM

## 2018-04-23 NOTE — Plan of Care (Signed)
  Problem: Education: Goal: Knowledge of disease or condition will improve Outcome: Progressing   

## 2018-04-23 NOTE — Progress Notes (Signed)
[facility] MD Progress Note  04/23/2018 11:09 AM Shannon Cervantes  MRN:  [id number]   Subjective: Patient reports feeling partially better today.  States that yesterday "I had a bad day".  Reports yesterday felt "dizzy", with "swimming feeling" which she attributes to having taken Ambien the night prior.  At this time states that symptoms have abated/are improving. Her mood is improving and she is feeling better today, less depressed, still describes a lingering sense of anxiety. Denies suicidal ideations.  Objective: I have discussed case with treatment team and have met with patient. 47 year old female, history of alcohol dependence, recent heavy/regular alcohol consumption.  Admitted with facial trauma.  Patient's memory of event is limited.  Report is that she had walked out into the middle of traffic and fallen during an episode of intoxication.  Patient reports partial improvement compared to how she felt yesterday.  As above states she has been feeling dizzy and "jittery" following taking Ambien 2 nights ago.  By now symptoms have abated.  Does describe some lingering anxiety which she describes as chronic.  Mood is improving and currently denies any suicidal ideations. Insomnia remains a major concern for patient.  States she slept better but still not optimally last night.  Currently denies cravings for alcohol. Denies suicidal ideations.  Principal Problem: Alcohol dependence with alcohol-induced mood disorder ([place])  Diagnosis:   Patient Active Problem List   Diagnosis Date Noted  . 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 Alcohol withdrawal without perceptual disturbances with complication ([place]) [W80.321]   . Alcoholic hepatitis [Y24.82]   . Low hemoglobin [D64.9] 02/15/2017  . Gastric cancer ([place]) [C16.9] 02/15/2017  . Alcohol use disorder, moderate, dependence ([place]) [F10.20] 02/15/2017  . Viral gastroenteritis [A08.4] 01/11/2015  .  Needle stick injury of finger [IMO0002] 12/11/2014  . Essential hypertension [I10] 12/11/2014  . Cough [R05] 12/11/2014  . Major depressive disorder, recurrent severe without psychotic features ([place]) [F33.2] 11/13/2014  . PTSD (post-traumatic stress disorder) [F43.10] 11/13/2014  . Alcohol dependence with alcohol-induced mood disorder ([place]) [F10.24] 11/12/2014  . Suicidal ideation [R45.851] 11/12/2014  . 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 Chest pain [R07.9] 11/02/2013  . Obesity [E66.9] 08/21/2011  . Hypertensive cardiomyopathy ([place]) [I11.9, I43] 08/20/2011   Total Time spent with patient: 20 minutes  Past Psychiatric History: See H&P  Past Medical History:  Past Medical History:  Diagnosis Date  . Alcohol abuse   . Anxiety   . Depression   . Foot fracture 01-07-2011  . Hyperlipidemia   . Hypertensive cardiomyopathy ([place])     Past Surgical History:  Procedure Laterality Date  . CESAREAN SECTION    . GASTRIC BYPASS     Family History:  Family History  Problem Relation Age of Onset  . Hypertension Mother   . Heart disease Mother   . Hyperlipidemia Mother   . Depression Mother   . Colon cancer Mother   . Heart disease Father   . Depression Father   . Renal Disease Father   . Alcohol abuse Brother   . Alcohol abuse Paternal Uncle    Family Psychiatric  History: See H&P  Social History:  Social History   Substance and Sexual Activity  Alcohol Use No  . Frequency: Never   Comment: none since jan 5th     Social History   Substance and Sexual Activity  Drug Use No    Social History   Socioeconomic History  . Marital  status: Divorced    Spouse name: Not on file  . Number of children: 4  . Years of education: 47  . Highest education level: Not on file  Occupational History  . Occupation: Therapist, sports  Social Needs  . Financial resource strain: Not on file  . Food insecurity:    Worry: Not on file    Inability: Not on file  .  Transportation needs:    Medical: Not on file    Non-medical: Not on file  Tobacco Use  . Smoking status: Current Every Day Smoker    Packs/day: 0.50    Years: 20.00    Pack years: 10.00    Types: Cigarettes  . Smokeless tobacco: Never Used  Substance and Sexual Activity  . Alcohol use: No    Frequency: Never    Comment: none since jan 5th  . Drug use: No  . Sexual activity: Yes    Birth control/protection: None  Lifestyle  . Physical activity:    Days per week: Not on file    Minutes per session: Not on file  . Stress: Not on file  Relationships  . Social connections:    Talks on phone: Not on file    Gets together: Not on file    Attends religious service: Not on file    Active member of club or organization: Not on file    Attends meetings of clubs or organizations: Not on file    Relationship status: Not on file  Other Topics Concern  . Not on file  Social History Narrative   Fun: Travel and shopping   Denies religious beliefs effecting healthcare.    Additional Social History:    Pain Medications: See MAR Prescriptions: See MAR Over the Counter: See MAR History of alcohol / drug use?: Yes Longest period of sobriety (when/how long): 2 yrs Negative Consequences of Use: Personal relationships Withdrawal Symptoms: Tremors Name of Substance 1: Alcohol 1 - Age of First Use: teens 1 - Amount (size/oz): excessive 1 - Frequency: daily 1 - Duration: ongoing 1 - Last Use / Amount: 04-17-18  Sleep: fair/ improving  Appetite:  Good  Current Medications: Current Facility-Administered Medications  Medication Dose Route Frequency Provider Last Rate Last Dose  . acamprosate (CAMPRAL) tablet 666 mg  666 mg Oral TID WC Ehan Freas, [name], MD      . acetaminophen (TYLENOL) tablet 650 mg  650 mg Oral Q6H PRN [name], NP   650 mg at 04/23/18 7673  . alum & mag hydroxide-simeth (MAALOX/MYLANTA) 200-200-20 MG/5ML suspension 30 mL  30 mL Oral Q4H PRN [name],  NP   30 mL at 04/22/18 1001  . cholecalciferol (VITAMIN D) tablet 2,000 Units  2,000 Units Oral Daily Sharma Covert, MD   2,000 Units at 04/23/18 [phone]  . dicyclomine (BENTYL) tablet 20 mg  20 mg Oral TID AC & HS [name], NP   20 mg at 04/23/18 7902  . folic acid (FOLVITE) tablet 1 mg  1 mg Oral Daily Sharma Covert, MD   1 mg at 04/23/18 0748  . ibuprofen (ADVIL,MOTRIN) tablet 600 mg  600 mg Oral Q6H PRN [name], NP   600 mg at 04/22/18 2242  . loperamide (IMODIUM) capsule 2 mg  2 mg Oral PRN [name], NP   2 mg at 04/22/18 1627  . LORazepam (ATIVAN) tablet 0.5 mg  0.5 mg Oral Q8H PRN Roxsana Riding, [name], MD      .  magnesium hydroxide (MILK OF MAGNESIA) suspension 30 mL  30 mL Oral Daily PRN [name], NP      . multivitamin with minerals tablet 1 tablet  1 tablet Oral Daily Sharma Covert, MD   1 tablet at 04/23/18 0748  . nicotine polacrilex (NICORETTE) gum 2 mg  2 mg Oral PRN Sharma Covert, MD   2 mg at 04/18/18 1725  . ondansetron (ZOFRAN-ODT) disintegrating tablet 4 mg  4 mg Oral Q8H PRN [name], NP   4 mg at 04/22/18 0748  . pneumococcal 23 valent vaccine (PNU-IMMUNE) injection 0.5 mL  0.5 mL Intramuscular Tomorrow-1000 Sharma Covert, MD      . sertraline (ZOLOFT) tablet 50 mg  50 mg Oral Daily Lyncoln Maskell, [name], MD   50 mg at 04/23/18 0749  . temazepam (RESTORIL) capsule 7.5 mg  7.5 mg Oral QHS PRN Tyliah Schlereth A, MD      . thiamine (VITAMIN B-1) tablet 100 mg  100 mg Oral Daily [name], NP   100 mg at 04/23/18 8299   Lab Results: No results found for this or any previous visit (from the past 48 hour(s)).  Blood Alcohol level:  Lab Results  Component Value Date   ETH 375 (HH) 04/17/2018   ETH 368 (HH) 37/16/9678   Metabolic Disorder Labs: No results found for: HGBA1C, MPG No results found for: PROLACTIN Lab Results  Component Value Date   CHOL 167 10/31/2014   TRIG 62.0 10/31/2014   HDL 86.90 10/31/2014    CHOLHDL 2 10/31/2014   VLDL 12.4 10/31/2014   LDLCALC 68 10/31/2014   LDLCALC 100 (H) 08/20/2011   Physical Findings: AIMS: Facial and Oral Movements Muscles of Facial Expression: None, normal Lips and Perioral Area: None, normal Jaw: None, normal Tongue: None, normal,Extremity Movements Upper (arms, wrists, hands, fingers): None, normal Lower (legs, knees, ankles, toes): None, normal, Trunk Movements Neck, shoulders, hips: None, normal, Overall Severity Severity of abnormal movements (highest score from questions above): None, normal Incapacitation due to abnormal movements: None, normal Patient's awareness of abnormal movements (rate only patient's report): No Awareness, Dental Status Current problems with teeth and/or dentures?: No Does patient usually wear dentures?: No  CIWA:  CIWA-Ar Total: 13 COWS:  COWS Total Score: 5  Musculoskeletal: Strength & Muscle Tone: within normal limits- no tremors, no diaphoresis, no psychomotor agitation Gait & Station: normal Patient leans: N/A  Psychiatric Specialty Exam: Physical Exam  Nursing note and vitals reviewed.   Review of Systems  Cardiovascular: Negative.  Negative for chest pain and palpitations.  Psychiatric/Behavioral: Positive for depression ("Improving") and substance abuse (Hx. alcoholism). Negative for hallucinations, memory loss and suicidal ideas. The patient is nervous/anxious and has insomnia.   reports some dizziness, but improving today. She denies chest pain ,no shortness of breath, no vomiting   Blood pressure 117/82, pulse 83, temperature 97.8 F (36.6 C), temperature source Oral, resp. rate 20, height '5\' 3"'  (1.6 m), weight 68.4 kg, last menstrual period 04/17/2018.Body mass index is 26.71 kg/m.  General Appearance: improving grooming   Eye Contact:  Good  Speech:  Normal Rate  Volume:  Normal  Mood:  states her mood has improved , less anxious. Remains anxious   Affect:  Appropriate, vaguely anxious, tends  to improve during session  Thought Process:  Linear and Descriptions of Associations: Intact  Orientation:  Full (Time, Place, and Person)  Thought Content:  no hallucinations, no delusions, not internally preoccupied   Suicidal  Thoughts:  No- denies suicidal or self injurious ideations at this time, contracts for safety on unit, denies homicidal or violent ideations  Homicidal Thoughts:  No  Memory:  recent and remote grossly intact, but does not remember events leading to admission clearly   Judgement:  Improving   Insight:  Fair- improving   Psychomotor Activity:  Normal  Concentration:  Concentration: Good and Attention Span: Good  Recall:  Good  Fund of Knowledge:  Good  Language:  Good  Akathisia:  No  Handed:  Right  AIMS (if indicated):     Assets:  Communication Skills Desire for Improvement Financial Resources/Insurance Housing Physical Health Social Support Transportation  ADL's:  Intact  Cognition:  WNL  Sleep:  Number of Hours: 5.75   Assessment -  47 year old female, history of alcohol dependence, recent heavy/regular alcohol consumption.  Admitted with facial trauma.  Patient's memory of event is limited.  Report is that she had walked out into the middle of traffic and fallen during an episode of intoxication.  At this time patient reports partially improved mood , and affect is more reactive . Reports she had increased anxiety and felt dizzy most of the day yesterday which she attributes to having taken Ambien the night prior. At this time reports symptoms are improving . Her vitals are currently stable and she is not presenting with symptoms of alcohol WDL- no tremors, no diaphoresis, no psychomotor agitation. Currently on Temazepam PRN for insomnia. She understands rationale to avoid long term management with BZDs if possible .   Treatment Plan Summary: Daily contact with patient to assess and evaluate symptoms and progress in treatment, Medication management and  Plan is to:  Treatment Plan reviewed as below today 11/1  Encourage efforts to work on sobriety and relapse prevention Encourage group and milieu participation to work on Radiographer, therapeutic and symptom reduction Restoril 7.5 mgrs QHS PRN for insomnia Campral 666 mgrs TID for alcohol dependence Zoloft 50 mgrs QDAY for depression, anxiety Treatment team working on disposition planning options   Jenne Campus, MD, 04/23/2018, 11:09 AM   Patient ID: Bertram Millard, female   DOB: 12-29-1970, 47 y.o.   MRN: [id number]

## 2018-04-23 NOTE — Plan of Care (Signed)
  Problem: Activity: Goal: Sleeping patterns will improve Outcome: Progressing Note:  Slept 5.75 hours last night per flowsheet.

## 2018-04-23 NOTE — Progress Notes (Signed)
D Pt is observed OOB UAL on the 300 hall today. SHe is pleasant, depressed and has a flat affect. SHe speaks softly hesitates when  she answers writers questions, making sure she explains herself exactly and almost has  apressured -like speech. She does not want to take her zoloft at breakfast and will take ot at lunch , due to poor po intake.      A She completed her daily assessment and on this she wrote she deneid SI today and she rated her depression, hopelessness and anxiety " 0/0/10", respectively. She does not want to wear street clothese, choosing to stay in a hospital-issue blue patient gown. She completed her daily assessment and on this she wrote she deneid SI today and she rated her depression, hopelessness and anxiety " 0/0/10", respectively .     R Safety si in place.

## 2018-04-23 NOTE — Progress Notes (Signed)
D: Pt was in dayroom upon initial approach.  Pt presents with anxious, depressed affect and mood.  She describes her day as "not too good."  She reports she felt "a little bit better" this morning and "I guess it was the Ambien."  Pt reports that later in the day she "felt dizzy, real hot.  I can't do that Zoloft anymore."  Pt denies SI/HI, denies hallucinations, reports R foot pain of 6/10.  Pt has been visible in milieu interacting with peers and staff appropriately.  Pt attended evening group.    A: Introduced self to pt.  Met with pt 1:1.  Actively listened to pt and offered support and encouragement. Medication administered per order.  PRN medication administered for anxiety and pain.  Cold packs provided for pain.  MHT adjusted pt's bed.  Fall prevention techniques reviewed with pt and she verbalized understanding.  Q15 minute safety checks maintained.    R: Pt is safe on the unit.  Pt is compliant with medications.  Pt verbally contracts for safety.  Will continue to monitor and assess.

## 2018-04-24 DIAGNOSIS — F332 Major depressive disorder, recurrent severe without psychotic features: Secondary | ICD-10-CM

## 2018-04-24 MED ORDER — SERTRALINE HCL 25 MG PO TABS
25.0000 mg | ORAL_TABLET | Freq: Every day | ORAL | Status: DC
  Filled 2018-04-24: qty 1
  Filled 2018-04-24 (×2): qty 7
  Filled 2018-04-24: qty 1

## 2018-04-24 MED ORDER — SERTRALINE HCL 25 MG PO TABS: 25.0000 mg | ORAL_TABLET | Freq: Every day | ORAL | 0 refills | Status: AC

## 2018-04-24 MED ORDER — TEMAZEPAM 7.5 MG PO CAPS: 7.5000 mg | ORAL_CAPSULE | Freq: Every evening | ORAL | 0 refills | Status: AC | PRN

## 2018-04-24 MED ORDER — ACAMPROSATE CALCIUM 333 MG PO TBEC: 666.0000 mg | DELAYED_RELEASE_TABLET | Freq: Three times a day (TID) | ORAL | 0 refills | Status: AC

## 2018-04-24 NOTE — Discharge Summary (Addendum)
Physician Discharge Summary Note  Patient:  Shannon Cervantes is an 47 y.o., female MRN:  [id number] DOB:  [birth year] Patient phone:  [phone] (home)  Patient address:   [place] [address],  Total Time spent with patient: 20 minutes  Date of Admission:  04/18/2018 Date of Discharge: 04/24/18  Reason for Admission:  Worsening depression with SI and substance abuse  Principal Problem: Alcohol dependence with alcohol-induced mood disorder [facility]) Discharge Diagnoses: Patient Active Problem List   Diagnosis Date Noted  . Major depressive disorder, single episode, severe without psychosis ([place]) [F32.2] 04/18/2018  . Alcohol withdrawal without perceptual disturbances with complication ([place]) [J00.938]   . Alcoholic hepatitis [H82.99]   . Low hemoglobin [D64.9] 02/15/2017  . Gastric cancer ([place]) [C16.9] 02/15/2017  . Alcohol use disorder, moderate, dependence ([place]) [F10.20] 02/15/2017  . Viral gastroenteritis [A08.4] 01/11/2015  . Needle stick injury of finger [IMO0002] 12/11/2014  . Essential hypertension [I10] 12/11/2014  . Cough [R05] 12/11/2014  . Major depressive disorder, recurrent severe without psychotic features ([place]) [F33.2] 11/13/2014  . PTSD (post-traumatic stress disorder) [F43.10] 11/13/2014  . Alcohol dependence with alcohol-induced mood disorder ([place]) [F10.24] 11/12/2014  . Suicidal ideation [R45.851] 11/12/2014  . 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 Chest pain [R07.9] 11/02/2013  . Obesity [E66.9] 08/21/2011  . Hypertensive cardiomyopathy ([place]) [I11.9, I43] 08/20/2011    Past Psychiatric History: Patient is had multiple psychiatric hospitalizations secondary to her alcohol related issues.  She also has been in substance abuse treatment program at least x1.  She stated she had been sober since January 2019.  She has been treated for alcohol dependence with injectable naltrexone, Topamax.  She has taken Wellbutrin, Zoloft and  amitriptyline for depression in the past as well.   Past Medical History:  Past Medical History:  Diagnosis Date  . Alcohol abuse   . Anxiety   . Depression   . Foot fracture 01-07-2011  . Hyperlipidemia   . Hypertensive cardiomyopathy ([place])     Past Surgical History:  Procedure Laterality Date  . CESAREAN SECTION    . GASTRIC BYPASS     Family History:  Family History  Problem Relation Age of Onset  . Hypertension Mother   . Heart disease Mother   . Hyperlipidemia Mother   . Depression Mother   . Colon cancer Mother   . Heart disease Father   . Depression Father   . Renal Disease Father   . Alcohol abuse Brother   . Alcohol abuse Paternal Uncle    Family Psychiatric  History: Vision stated multiple members of her family have problems with anxiety, depression and substances Social History:  Social History   Substance and Sexual Activity  Alcohol Use No  . Frequency: Never   Comment: none since jan 5th     Social History   Substance and Sexual Activity  Drug Use No    Social History   Socioeconomic History  . Marital status: Divorced    Spouse name: Not on file  . Number of children: 4  . Years of education: 47  . Highest education level: Not on file  Occupational History  . Occupation: Therapist, sports  Social Needs  . Financial resource strain: Not on file  . Food insecurity:    Worry: Not on file    Inability: Not on file  . Transportation needs:    Medical: Not on file    Non-medical: Not on file  Tobacco Use  . Smoking status:  Current Every Day Smoker    Packs/day: 0.50    Years: 20.00    Pack years: 10.00    Types: Cigarettes  . Smokeless tobacco: Never Used  Substance and Sexual Activity  . Alcohol use: No    Frequency: Never    Comment: none since jan 5th  . Drug use: No  . Sexual activity: Yes    Birth control/protection: None  Lifestyle  . Physical activity:    Days per week: Not on file    Minutes per session: Not on file  . Stress: Not on  file  Relationships  . Social connections:    Talks on phone: Not on file    Gets together: Not on file    Attends religious service: Not on file    Active member of club or organization: Not on file    Attends meetings of clubs or organizations: Not on file    Relationship status: Not on file  Other Topics Concern  . Not on file  Social History Narrative   Fun: Travel and shopping   Denies religious beliefs effecting healthcare.     Hospital Course:   04/18/18 [facility] MD Assessment: 47 year old female with a past psychiatric history significant for alcohol dependence who presented to the [facility] emergency department on 04/17/2018 under involuntary commitment. The patient stated that she had moved from [place] back to [place]. Her mother was in poor health and she wanted to come back and help her. She has had a history of alcohol dependence, but had been sober since January 2019. She took a drink of alcohol with her brother the other day, and then relapsed. Her family found her located with 5 bottles of Ciro[place]and severalFour Locos.The patient also stated that she had drank some amount of boxed wine. The patient also had been recently fired from her job due to excessive drinking. 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 She yesterday was transported to the hospital because she walked out and into the middle of traffic on some [address] and fell. She has significant bruises on her face, arms and legs. She does not recall what happened. CT scan of the brain was done, and was negative. She has had multiple hospitalizations for alcohol-related issues. Review of the electronic medical record revealed hospitalization secondary to an overdose on 04/02/2017, suicidal ideation on 03/26/2017, hospitalization in [place] secondary to alcohol on 03/18/2017, and a hospitalization at our facility on 02/14/2017. She stated that she had been sober since January, but the  relapse has apparently set her back significantly. Her blood alcohol in the emergency room was 375. She is requesting detox, and admits to feeling bad, isolated, pain, nausea, helplessness, hopelessness and worthlessness. She was admitted to the hospital for evaluation and stabilization.  Patient remained on the [facility] unit for 6 days. The patient stabilized on medication and therapy. Patient was discharged on Campral 666 mg TID, Restoril 7.5 mg QHS PRN, Zoloft 25 mg Daily. Patient has shown improvement with improved mood, affect, sleep, appetite, and interaction. Patient has attended group and participated. Patient has been seen in the day room interacting with peers and staff appropriately. Patient denies any SI/HI/AVH and contracts for safety. Patient agrees to follow up at [facility]. Patient is provided with prescriptions for their medications upon discharge.   Physical Findings: AIMS: Facial and Oral Movements Muscles of Facial Expression: None, normal Lips and Perioral Area: None, normal Jaw: None, normal Tongue: None, normal,Extremity Movements Upper (arms, wrists,  hands, fingers): None, normal Lower (legs, knees, ankles, toes): None, normal, Trunk Movements Neck, shoulders, hips: None, normal, Overall Severity Severity of abnormal movements (highest score from questions above): None, normal Incapacitation due to abnormal movements: None, normal Patient's awareness of abnormal movements (rate only patient's report): No Awareness, Dental Status Current problems with teeth and/or dentures?: No Does patient usually wear dentures?: No  CIWA:  CIWA-Ar Total: 8 COWS:  COWS Total Score: 5  Musculoskeletal: Strength & Muscle Tone: within normal limits Gait & Station: normal Patient leans: N/A  Psychiatric Specialty Exam: Physical Exam  Nursing note and vitals reviewed. Constitutional: She is oriented to person, place, and time. She appears well-developed and well-nourished.   Cardiovascular: Normal rate.  Respiratory: Effort normal.  Musculoskeletal: Normal range of motion.  Neurological: She is alert and oriented to person, place, and time.  Skin: Skin is warm.    Review of Systems  Constitutional: Negative.   HENT: Negative.   Eyes: Negative.   Respiratory: Negative.   Cardiovascular: Negative.   Gastrointestinal: Negative.   Genitourinary: Negative.   Musculoskeletal: Negative.   Skin: Negative.   Neurological: Negative.   Endo/Heme/Allergies: Negative.   Psychiatric/Behavioral: Negative.     Blood pressure 111/78, pulse 80, temperature 98.2 F (36.8 C), temperature source Oral, resp. rate 20, height 5\' 3"  (1.6 m), weight 68.4 kg, last menstrual period 04/17/2018.Body mass index is 26.71 kg/m.  General Appearance: Casual  Eye Contact:  Good  Speech:  Clear and Coherent and Normal Rate  Volume:  Normal  Mood:  Euthymic  Affect:  Congruent  Thought Process:  Goal Directed and Descriptions of Associations: Intact  Orientation:  Full (Time, Place, and Person)  Thought Content:  WDL  Suicidal Thoughts:  No  Homicidal Thoughts:  No  Memory:  Immediate;   Good Recent;   Good Remote;   Good  Judgement:  Fair  Insight:  Fair  Psychomotor Activity:  Normal  Concentration:  Concentration: Good and Attention Span: Good  Recall:  Good  Fund of Knowledge:  Good  Language:  Good  Akathisia:  No  Handed:  Right  AIMS (if indicated):     Assets:  Communication Skills Desire for Improvement Financial Resources/Insurance Housing Physical Health Social Support Transportation  ADL's:  Intact  Cognition:  WNL  Sleep:  Number of Hours: 6     Have you used any form of tobacco in the last 30 days? (Cigarettes, Smokeless Tobacco, Cigars, and/or Pipes): Yes  Has this patient used any form of tobacco in the last 30 days? (Cigarettes, Smokeless Tobacco, Cigars, and/or Pipes) Yes, Yes, A prescription for an FDA-approved tobacco cessation medication was  offered at discharge and the patient refused  Blood Alcohol level:  Lab Results  Component Value Date   ETH 375 ([place]) 04/17/2018   ETH 368 ([place]) 53/29/9242    Metabolic Disorder Labs:  No results found for: HGBA1C, MPG No results found for: PROLACTIN Lab Results  Component Value Date   CHOL 167 10/31/2014   TRIG 62.0 10/31/2014   HDL 86.90 10/31/2014   CHOLHDL 2 10/31/2014   VLDL 12.4 10/31/2014   LDLCALC 68 10/31/2014   LDLCALC 100 (H) 08/20/2011    See Psychiatric Specialty Exam and Suicide Risk Assessment completed by Attending Physician prior to discharge.  Discharge destination:  Home  Is patient on multiple antipsychotic therapies at discharge:  No   Has Patient had three or more failed trials of antipsychotic monotherapy by history:  No  Recommended Plan  for Multiple Antipsychotic Therapies: NA   Allergies as of 04/24/2018   No Known Allergies     Medication List    STOP taking these medications   acetaminophen 500 MG tablet Commonly known as:  TYLENOL   estazolam 2 MG tablet Commonly known as:  PROSOM   ferrous sulfate 325 (65 FE) MG tablet   hydrOXYzine 25 MG tablet Commonly known as:  ATARAX/VISTARIL   LORazepam 0.5 MG tablet Commonly known as:  ATIVAN   ondansetron 4 MG tablet Commonly known as:  ZOFRAN   ranitidine 150 MG tablet Commonly known as:  ZANTAC   Vitamin D (Ergocalciferol) 50000 units Caps capsule Commonly known as:  DRISDOL     TAKE these medications     Indication  acamprosate 333 MG tablet Commonly known as:  CAMPRAL Take 2 tablets (666 mg total) by mouth 3 (three) times daily with meals.  Indication:  Excessive Use of Alcohol   folic acid 1 MG tablet Commonly known as:  FOLVITE Take 1 mg by mouth daily.  Indication:  Per PCP   sertraline 25 MG tablet Commonly known as:  ZOLOFT Take 1 tablet (25 mg total) by mouth daily. For mood control  Indication:  mood stability   temazepam 7.5 MG capsule Commonly known as:   RESTORIL Take 1 capsule (7.5 mg total) by mouth at bedtime as needed for sleep.  Indication:  Trouble Sleeping      Follow-up Information    [facility] Follow up on 04/28/2018.   Specialty:  Behavioral Health Why:  Hospital follow-up on .8:15AM. Please bring: photo ID, social security card, any proof of income if you have it, and hospital discharge paperwork. Thank you.  Contact information: [address] [place] [address] [phone]           Follow-up recommendations:  Continue activity as tolerated. Continue diet as recommended by your PCP. Ensure to keep all appointments with outpatient providers.  Comments:  Patient is instructed prior to discharge to: Take all medications as prescribed by his/her mental healthcare provider. Report any adverse effects and or reactions from the medicines to his/her outpatient provider promptly. Patient has been instructed & cautioned: To not engage in alcohol and or illegal drug use while on prescription medicines. In the event of worsening symptoms, patient is instructed to call the crisis hotline, 911 and or go to the nearest ED for appropriate evaluation and treatment of symptoms. To follow-up with his/her primary care provider for your other medical issues, concerns and or health care needs.    Signed: Lowry Ram Money, FNP 04/24/2018, 8:24 AM   Patient seen, Suicide Assessment Completed.  Disposition Plan Reviewed

## 2018-04-24 NOTE — Progress Notes (Signed)
Pt requested and received PRN medication for sleep earlier.  At this time, pt is resting in bed with eyes closed.  Respirations are even and unlabored.  No distress noted.  Pt appears to be sleeping.  Will continue to monitor and assess.

## 2018-04-24 NOTE — BHH Group Notes (Signed)
LCSW Group Therapy Note  04/24/2018   10:30-11:30am   Type of Therapy and Topic:  Group Therapy: Anger, a Secondary Emotion  Participation Level:  None  Description of Group:   In this group, patients learned how to recognize the underlying emotions when they become angry as well as the frequent behavioral responses they have to anger-provoking situations.  They identified a recent time they became angry and how they reacted.  They analyzed how their reaction was possibly beneficial and how it was possibly unhelpful.  The group discussed a variety of healthier coping skills that could help with such a situation in the future, including taking time to think about the underlying primary emotion and figuring out ways to deal with that before it escalates to anger.  Therapeutic Goals: 1. Patients will remember their last incident of anger and rate it on a scale of 1-10, identifying the severity for them personally. 2. Patients will identify how their behavior at that time worked for them, as well as how it worked against them. 3. Patients will explore possible new behaviors to use in future anger situations. 4. Patients will learn that anger itself is normal and cannot be eliminated, and that healthier reactions can assist with resolving conflict rather than worsening situations.  Summary of Patient Progress:  The patient was late to group and left really without really participating.  Therapeutic Modalities:   Cognitive Behavioral Therapy Discussion  Shannon Cervantes

## 2018-04-24 NOTE — Plan of Care (Signed)
D: Pt A & O X 3. Denies SI, HI, AVH and pain at this time. Patient slept well last night, and received medication that was helpful for sleep. Her appetite is poor, energy normal and concentration good. She rates her depression and feeling of hopelessness 0/10. She rates her anxiety 6/10. She denies withdrawal symptoms. Her bruises are reported to be improved. Headache 8/10, and patient complains or irritability, runny nose, nausea, dizziness, and lightheadedness. Her vitals are WNL. Patient to follow up with [facility], patient educated. D/C home as ordered. Picked up in lobby by  A: D/C instructions reviewed with pt including prescriptions, medication samples and follow up appointment, compliance encouraged. All belongings from locker #32 given to pt at time of departure. Scheduled and PRN medications given with verbal education and effects monitored. Safety checks maintained without incident till time of d/c.  R: Pt receptive to care. Compliant with medications when offered. Denies adverse drug reactions when assessed. Verbalized understanding related to d/c instructions. Signed belonging sheet in agreement with items received from locker. Ambulatory with a steady gait. Appears to be in no physical distress at time of departure.

## 2018-04-24 NOTE — BHH Suicide Risk Assessment (Signed)
[facility] Discharge Suicide Risk Assessment   Principal Problem: Alcohol dependence with alcohol-induced mood disorder [facility]) Discharge Diagnoses:  Patient Active Problem List   Diagnosis Date Noted  . Major depressive disorder, single episode, severe without psychosis ([place]) [F32.2] 04/18/2018  . Alcohol withdrawal without perceptual disturbances with complication ([place]) [J28.786]   . Alcoholic hepatitis [V67.20]   . Low hemoglobin [D64.9] 02/15/2017  . Gastric cancer ([place]) [C16.9] 02/15/2017  . Alcohol use disorder, moderate, dependence ([place]) [F10.20] 02/15/2017  . Viral gastroenteritis [A08.4] 01/11/2015  . Needle stick injury of finger [IMO0002] 12/11/2014  . Essential hypertension [I10] 12/11/2014  . Cough [R05] 12/11/2014  . Major depressive disorder, recurrent severe without psychotic features ([place]) [F33.2] 11/13/2014  . PTSD (post-traumatic stress disorder) [F43.10] 11/13/2014  . Alcohol dependence with alcohol-induced mood disorder ([place]) [F10.24] 11/12/2014  . Suicidal ideation [R45.851] 11/12/2014  . 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 Chest pain [R07.9] 11/02/2013  . Obesity [E66.9] 08/21/2011  . Hypertensive cardiomyopathy ([place]) [I11.9, I43] 08/20/2011    Total Time spent with patient: 30 minutes  Musculoskeletal: Strength & Muscle Tone: within normal limits Gait & Station: normal Patient leans: N/A  Psychiatric Specialty Exam: ROS mild headache, no chest pain, no shortness of breath, no vomiting, no fever or chills   Blood pressure 111/78, pulse 80, temperature 98.2 F (36.8 C), temperature source Oral, resp. rate 20, height 5\' 3"  (1.6 m), weight 68.4 kg, last menstrual period 04/17/2018.Body mass index is 26.71 kg/m.  General Appearance: Well Groomed  Eye Contact::  Good  Speech:  Normal Rate409  Volume:  Normal  Mood:  reports improving mood, states " I feel a lot better"  Affect:  more reactive, smiles/laughs at times appropriately   Thought Process:  Linear and Descriptions of Associations: Intact  Orientation:  Other:  fullyn alert and attentive   Thought Content:  no hallucinations, no delusions, not internally preoccupied   Suicidal Thoughts:  No denies suicidal or self injurious ideations, no homicidal or violent ideations   Homicidal Thoughts:  No  Memory:  recent and remote grossly intact   Judgement:  Other:  improving   Insight:  improving  Psychomotor Activity:  Normal- no tremors, no restlessness , no psychomotor agitation  Concentration:  Good  Recall:  Good  Fund of Knowledge:Good  Language: Good  Akathisia:  Negative  Handed:  Right  AIMS (if indicated):     Assets:  Communication Skills Desire for Improvement Resilience  Sleep:  Number of Hours: 6  Cognition: WNL  ADL's:  Intact   Mental Status Per Nursing Assessment::   On Admission:  NA  Demographic Factors:  63, has 4 children, lives with mother, had recently relocated from [place] to [place] to take care of mother who has early dementia  Loss Factors: Recent relapse , recent relocation, mother has had cognitive decline   Historical Factors: History of depression, history of anxiety, history of alcohol use disorder. History of suicide attempt by overdosing as teenager .  Risk Reduction Factors:   Sense of responsibility to family, Living with another person, especially a relative and Positive coping skills or problem solving skills  Continued Clinical Symptoms:  At this time patient is alert, attentive, well related, mood improved and currently euthymic, with fuller range of affect, no thought disorder, no suicidal or self injurious ideations, no homicidal or violent ideations, no hallucinations,no delusions. No disruptive or agitated behaviors on unit, pleasant on approach. Reports Zoloft was causing some nausea - states she  requested to decrease dose to 25 mgrs QDAY , at which she is not having side effects. Plans to titrate gradually.    Cognitive Features That Contribute To Risk:  No gross cognitive deficits noted upon discharge. Is alert , attentive, and oriented x 3   Suicide Risk:  Mild:  Suicidal ideation of limited frequency, intensity, duration, and specificity.  There are no identifiable plans, no associated intent, mild dysphoria and related symptoms, good self-control (both objective and subjective assessment), few other risk factors, and identifiable protective factors, including available and accessible social support.  Follow-up Information    [facility] Follow up on 04/28/2018.   Specialty:  Behavioral Health Why:  Hospital follow-up on .8:15AM. Please bring: photo ID, social security card, any proof of income if you have it, and hospital discharge paperwork. Thank you.  Contact information: [place] [address] [phone]           Plan Of Care/Follow-up recommendations:  Activity:  as tolerated  Diet:  regular Tests:  NA Other:  See below Patient is expressing significant improvement compared to admission, readiness for discharge and is leaving unit in good spirits  Plans to return home Plans to follow up as above  Encouraged her to restart going to [place] meetings regularly , which she states had helped her remain sober in the past . She plans to get a PCP for medical monitoring and management as needed . Jenne Campus, MD 04/24/2018, 9:44 AM

## 2018-04-24 NOTE — Progress Notes (Signed)
  [facility] Adult Case Management Discharge Plan :  Will you be returning to the same living situation after discharge:  Yes,  to mother's home At discharge, do you have transportation home?: Yes,  family Do you have the ability to pay for your medications: No.  No insurance, being referred to [facility] for help  Release of information consent forms completed and turned in to Medical Records by CSW.   Patient to Follow up at: Follow-up Information    [facility] Follow up on 04/28/2018.   Specialty:  Behavioral Health Why:  Hospital follow-up on .8:15AM. Please bring: photo ID, social security card, any proof of income if you have it, and hospital discharge paperwork. Thank you.  Contact information: [place] [id number] [phone]           Next level of care provider has access to [facility] and Suicide Prevention discussed: Yes,  with mother  Have you used any form of tobacco in the last 30 days? (Cigarettes, Smokeless Tobacco, Cigars, and/or Pipes): Yes  Has patient been referred to the Quitline?: Patient refused referral  Patient has been referred for addiction treatment: Yes  Maretta Los, LCSW 04/24/2018, 9:05 AM
